# Patient Record
Sex: Female | Born: 2000
Health system: Southern US, Community
[De-identification: ages and names within clinical notes are randomized; demographics above are authoritative.]

## PROBLEM LIST (undated history)

## (undated) ENCOUNTER — Ambulatory Visit: Discharge status: Absent without leave | Payer: Medicaid Other

## (undated) ENCOUNTER — Ambulatory Visit (HOSPITAL_COMMUNITY): Admission: EM | Payer: 59

## (undated) DIAGNOSIS — F319 Bipolar disorder, unspecified: Secondary | ICD-10-CM

## (undated) DIAGNOSIS — I10 Essential (primary) hypertension: Secondary | ICD-10-CM

## (undated) DIAGNOSIS — I2699 Other pulmonary embolism without acute cor pulmonale: Secondary | ICD-10-CM

## (undated) DIAGNOSIS — J45909 Unspecified asthma, uncomplicated: Secondary | ICD-10-CM

## (undated) DIAGNOSIS — J353 Hypertrophy of tonsils with hypertrophy of adenoids: Secondary | ICD-10-CM

## (undated) HISTORY — DX: Essential (primary) hypertension: I10

## (undated) HISTORY — DX: Other pulmonary embolism without acute cor pulmonale: I26.99

## (undated) HISTORY — PX: WISDOM TOOTH EXTRACTION: SHX21

---
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
## 2019-01-01 ENCOUNTER — Other Ambulatory Visit: Payer: Self-pay

## 2019-01-01 ENCOUNTER — Other Ambulatory Visit (HOSPITAL_COMMUNITY)
Admission: RE | Admit: 2019-01-01 | Discharge: 2019-01-01 | Disposition: A | Discharge status: Home / Self Care | Payer: BC Managed Care – PPO | Source: Ambulatory Visit | Attending: Internal Medicine | Admitting: Internal Medicine

## 2019-01-01 DIAGNOSIS — Z01812 Encounter for preprocedural laboratory examination: Secondary | ICD-10-CM | POA: Insufficient documentation

## 2019-01-01 DIAGNOSIS — Z20828 Contact with and (suspected) exposure to other viral communicable diseases: Secondary | ICD-10-CM | POA: Diagnosis not present

## 2019-01-01 LAB — SARS CORONAVIRUS 2 (TAT 6-24 HRS): SARS Coronavirus 2: NEGATIVE

## 2019-01-02 ENCOUNTER — Inpatient Hospital Stay (HOSPITAL_COMMUNITY): Admission: RE | Admit: 2019-01-02 | Payer: BC Managed Care – PPO | Source: Ambulatory Visit

## 2019-01-02 ENCOUNTER — Encounter (HOSPITAL_COMMUNITY): Payer: BLUE CROSS/BLUE SHIELD

## 2019-01-15 ENCOUNTER — Other Ambulatory Visit: Payer: Self-pay

## 2019-01-15 ENCOUNTER — Other Ambulatory Visit (HOSPITAL_COMMUNITY)
Admission: RE | Admit: 2019-01-15 | Discharge: 2019-01-15 | Disposition: A | Discharge status: Home / Self Care | Payer: Medicaid Other | Source: Ambulatory Visit | Attending: Internal Medicine | Admitting: Internal Medicine

## 2019-01-15 DIAGNOSIS — U071 COVID-19: Secondary | ICD-10-CM | POA: Insufficient documentation

## 2019-01-15 DIAGNOSIS — Z01812 Encounter for preprocedural laboratory examination: Secondary | ICD-10-CM | POA: Diagnosis present

## 2019-01-16 ENCOUNTER — Telehealth: Payer: Self-pay

## 2019-01-16 LAB — SARS CORONAVIRUS 2 (TAT 6-24 HRS): SARS Coronavirus 2: POSITIVE — AB

## 2019-01-16 NOTE — Telephone Encounter (Signed)
Tried to contact patient no answer and vm is full 

## 2019-01-16 NOTE — Telephone Encounter (Signed)
Pt just learned about her positive covid test results via mychart. She called to see what she needed to tell her employer (She just got off work) and her family she has been around and how long she needs to be out of work for. I informed her that a nurse would call her back shortly with more information. She voiced understanding.   [place]

## 2019-01-16 NOTE — Telephone Encounter (Signed)
Phone call returned to pt.  She stated she needs a note to prove to her employer that the reason she went for COVID, was a pre-procedure requirement. Stated her employer feels she deliberately came to work, and exposed other people, when she should have made them aware, she had been tested.  Advised pt. she will need to contact her PCP for this note.   Advised pt. with positive COVID results/ virus was "detected", she can spread the germ to other people.  Reported she did not have any symptoms that made her question that she had COVID.  Denied cough, fever, shortness of breath.  Reported she has chronic sinus problems, and has had loss of taste and smell in past 4-5 days.  Stated the loss of taste/ smell has occurred off and on throughout the years, so she did not think anything about it.  Advised of CDC guidelines for self isolation/ ending isolation. Advised of safe practice guidelines.  Advised when to seek emergency care.  Instructed to rest and hydrate well.  Advised to leave the house during recommended isolation period, only if it is necessary to seek medical care.  Advised that anyone that was in direct contact with her, in past week, should quarantine x 14 days.  Advised the Health Dept. will be notified.  Pt. Verb. understanding of instructions.

## 2019-01-17 ENCOUNTER — Encounter (HOSPITAL_COMMUNITY): Payer: BC Managed Care – PPO

## 2019-01-17 DIAGNOSIS — U071 COVID-19: Secondary | ICD-10-CM | POA: Diagnosis not present

## 2019-01-22 ENCOUNTER — Encounter (HOSPITAL_COMMUNITY): Payer: Self-pay | Admitting: Emergency Medicine

## 2019-01-22 ENCOUNTER — Other Ambulatory Visit: Payer: Self-pay

## 2019-01-22 DIAGNOSIS — R0602 Shortness of breath: Secondary | ICD-10-CM | POA: Insufficient documentation

## 2019-01-22 DIAGNOSIS — Z79899 Other long term (current) drug therapy: Secondary | ICD-10-CM | POA: Diagnosis not present

## 2019-01-22 DIAGNOSIS — Z87891 Personal history of nicotine dependence: Secondary | ICD-10-CM | POA: Insufficient documentation

## 2019-01-22 DIAGNOSIS — U071 COVID-19: Secondary | ICD-10-CM | POA: Diagnosis not present

## 2019-01-22 DIAGNOSIS — Z91040 Latex allergy status: Secondary | ICD-10-CM | POA: Insufficient documentation

## 2019-01-22 NOTE — ED Triage Notes (Signed)
Pt c/o increased sob 2-3 days. Pt tested positive for covid x 7 days.

## 2019-01-23 ENCOUNTER — Emergency Department (HOSPITAL_COMMUNITY): Payer: Medicaid Other

## 2019-01-23 ENCOUNTER — Emergency Department (HOSPITAL_COMMUNITY)
Admission: EM | Admit: 2019-01-23 | Discharge: 2019-01-23 | Disposition: A | Discharge status: Home / Self Care | Payer: Medicaid Other | Attending: Emergency Medicine | Admitting: Emergency Medicine

## 2019-01-23 DIAGNOSIS — R0602 Shortness of breath: Secondary | ICD-10-CM

## 2019-01-23 DIAGNOSIS — U071 COVID-19: Secondary | ICD-10-CM

## 2019-01-23 LAB — URINALYSIS, ROUTINE W REFLEX MICROSCOPIC
Bilirubin Urine: NEGATIVE
Glucose, UA: NEGATIVE mg/dL
Hgb urine dipstick: NEGATIVE
Ketones, ur: NEGATIVE mg/dL
Nitrite: NEGATIVE
Protein, ur: NEGATIVE mg/dL
Specific Gravity, Urine: 1.025 (ref 1.005–1.030)
pH: 6 (ref 5.0–8.0)

## 2019-01-23 LAB — COMPREHENSIVE METABOLIC PANEL
ALT: 23 U/L (ref 0–44)
AST: 16 U/L (ref 15–41)
Albumin: 3.8 g/dL (ref 3.5–5.0)
Alkaline Phosphatase: 52 U/L (ref 38–126)
Anion gap: 6 (ref 5–15)
BUN: 13 mg/dL (ref 6–20)
CO2: 25 mmol/L (ref 22–32)
Calcium: 8.9 mg/dL (ref 8.9–10.3)
Chloride: 109 mmol/L (ref 98–111)
Creatinine, Ser: 0.58 mg/dL (ref 0.44–1.00)
GFR calc Af Amer: >60 mL/min (ref >60)
GFR calc non Af Amer: >60 mL/min (ref >60)
Glucose, Bld: 109 mg/dL — ABNORMAL HIGH (ref 70–99)
Potassium: 4.1 mmol/L (ref 3.5–5.1)
Sodium: 140 mmol/L (ref 135–145)
Total Bilirubin: 0.4 mg/dL (ref 0.3–1.2)
Total Protein: 7.4 g/dL (ref 6.5–8.1)

## 2019-01-23 LAB — CBC WITH DIFFERENTIAL/PLATELET
Abs Immature Granulocytes: 0.01 10*3/uL (ref 0.00–0.07)
Basophils Absolute: 0 10*3/uL (ref 0.0–0.1)
Basophils Relative: 0 %
Eosinophils Absolute: 0.1 10*3/uL (ref 0.0–0.5)
Eosinophils Relative: 2 %
HCT: 40.5 % (ref 36.0–46.0)
Hemoglobin: 12.8 g/dL (ref 12.0–15.0)
Immature Granulocytes: 0 %
Lymphocytes Relative: 44 %
Lymphs Abs: 2.5 10*3/uL (ref 0.7–4.0)
MCH: 28.6 pg (ref 26.0–34.0)
MCHC: 31.6 g/dL (ref 30.0–36.0)
MCV: 90.4 fL (ref 80.0–100.0)
Monocytes Absolute: 0.3 10*3/uL (ref 0.1–1.0)
Monocytes Relative: 6 %
Neutro Abs: 2.7 10*3/uL (ref 1.7–7.7)
Neutrophils Relative %: 48 %
Platelets: 225 10*3/uL (ref 150–400)
RBC: 4.48 MIL/uL (ref 3.87–5.11)
RDW: 13.8 % (ref 11.5–15.5)
WBC: 5.7 10*3/uL (ref 4.0–10.5)
nRBC: 0 % (ref 0.0–0.2)

## 2019-01-23 LAB — POC URINE PREG, ED: Preg Test, Ur: NEGATIVE

## 2019-01-23 MED ORDER — SODIUM CHLORIDE 0.9 % IV BOLUS
1000.0000 mL | Freq: Once | INTRAVENOUS | Status: AC
  Administered 2019-01-23: 1000 mL via INTRAVENOUS

## 2019-01-23 NOTE — ED Notes (Signed)
Pt O2 did not fall below 99% while ambulating, pt denies SOB, steady gait, stand by assist

## 2019-01-23 NOTE — ED Provider Notes (Signed)
[facility] EMERGENCY DEPARTMENT Provider Note   CSN: [id number] Arrival date & time: 01/22/19  2244   History Chief Complaint  Patient presents with   Shortness of Breath    LIBBI TOWNER is a 18 y.o. female.  The history is provided by the patient.  Shortness of Breath She has history of bipolar disorder and was diagnosed Covid +1-week ago and comes in because of shortness of breath for the last 2days.  Shortness of breath is both with exertion and at rest.  She denies cough and denies fever.  She has had anorexia and states that she thinks she is getting dehydrated.  She is having diarrhea 3-4 times a day.  She denies nausea or vomiting.  She denies arthralgias or myalgias.  She has been taking loperamide for diarrhea without any benefit.  She has had loss of smell and taste for about 3 weeks, but this is not unusual for her when she has sinus infections.  Past Medical History:  Diagnosis Date   Bipolar disorder (HCC)    Tonsillar and adenoid hypertrophy 07/2016   snores during sleep, denies apnea    Patient Active Problem List   Diagnosis Date Noted   Menorrhagia with irregular cycle 06/15/2017   Pregnancy examination or test, negative result 06/15/2017   Screening examination for STD (sexually transmitted disease) 06/15/2017   DMDD (disruptive mood dysregulation disorder) (HCC) 09/17/2016   MDD (major depressive disorder) 09/12/2016   Bipolar 1 disorder (HCC) 04/10/2016   Irritability and anger 04/10/2016   ODD (oppositional defiant disorder) 05/14/2014   Bipolar 1 disorder, depressed, moderate (HCC) 05/14/2014   Lives in group home 04/12/2014   Obesity 04/12/2014   Eczema 04/12/2014   Seasonal allergies 04/12/2014   Acanthosis nigricans 04/12/2014   Failed vision screen 04/12/2014    Past Surgical History:  Procedure Laterality Date   TONSILLECTOMY AND ADENOIDECTOMY N/A 08/02/2016   Procedure: TONSILLECTOMY AND ADENOIDECTOMY;  Surgeon: Newman Pies,  MD;  Location: [facility];  Service: ENT;  Laterality: N/A;   WISDOM TOOTH EXTRACTION       OB History    Gravida  0   Para  0   Term  0   Preterm  0   AB  0   Living  0     SAB  0   TAB  0   Ectopic  0   Multiple  0   Live Births  0           Family History  Problem Relation Age of Onset   Hypertension Mother    Cancer Mother        cervical   Diabetes Mother    Other Maternal Grandmother        pre diabetic   Other Paternal Grandmother        brain tumor    Social History   Tobacco Use   Smoking status: Former Smoker    Years: 1.00   Smokeless tobacco: Never Used  Substance Use Topics   Alcohol use: Yes    Comment: socially   Drug use: Yes    Types: Marijuana    Home Medications Prior to Admission medications   Medication Sig Start Date End Date Taking? Authorizing Provider  dicyclomine (BENTYL) 20 MG tablet Take 1 tablet (20 mg total) by mouth 3 (three) times daily as needed for spasms (abdominal cramping). 01/27/18   Long, Arlyss Repress, MD  ibuprofen (ADVIL) 800 MG tablet Take 1 tablet (800  mg total) by mouth every 6 (six) hours as needed for moderate pain. 06/23/18   Orpah Greek, MD  levocetirizine (XYZAL) 5 MG tablet Take 5 mg by mouth every evening.    [provider]  loperamide (IMODIUM) 2 MG capsule Take 1 capsule (2 mg total) by mouth 4 (four) times daily as needed for diarrhea or loose stools. 01/27/18   Long, Wonda Olds, MD  methocarbamol (ROBAXIN) 500 MG tablet Take 1 tablet (500 mg total) by mouth every 8 (eight) hours as needed for muscle spasms. 06/23/18   Orpah Greek, MD  montelukast (SINGULAIR) 10 MG tablet Take 10 mg by mouth at bedtime.    [provider]  Norethindrone-Ethinyl Estradiol-Fe Biphas (LO LOESTRIN FE) 1 MG-10 MCG / 10 MCG tablet Take 1 tablet by mouth daily. Take 1 daily by mouth 07/11/17   Derrek Monaco A, NP  ondansetron (ZOFRAN ODT) 4 MG disintegrating  tablet Take 1 tablet (4 mg total) by mouth every 8 (eight) hours as needed for nausea or vomiting. 01/27/18   Long, Wonda Olds, MD  ondansetron (ZOFRAN) 4 MG tablet Take 1 tablet (4 mg total) by mouth every 6 (six) hours as needed for nausea or vomiting. 06/23/18   Pollina, Gwenyth Allegra, MD    Allergies    Adhesive [tape] and Latex  Review of Systems   Review of Systems  Respiratory: Positive for shortness of breath.   All other systems reviewed and are negative.   Physical Exam Updated Vital Signs BP 117/73 (BP Location: Right Arm)    Pulse 84    Temp 98.8 F (37.1 C) (Oral)    Resp 20    Ht 5\' 8"  (1.727 m)    Wt 131.5 kg    LMP 01/10/2019    SpO2 100%    BMI 44.09 kg/m   Physical Exam Vitals and nursing note reviewed.   18 year old female, resting comfortably and in no acute distress. Vital signs are normal. Oxygen saturation is 100%, which is normal. Head is normocephalic and atraumatic. PERRLA, EOMI. Oropharynx is clear. Neck is nontender and supple without adenopathy or JVD. Back is nontender and there is no CVA tenderness. Lungs are clear without rales, wheezes, or rhonchi. Chest is nontender. Heart has regular rate and rhythm without murmur. Abdomen is soft, flat, nontender without masses or hepatosplenomegaly and peristalsis is normoactive. Extremities have no cyanosis or edema, full range of motion is present. Skin is warm and dry without rash. Neurologic: Mental status is normal, cranial nerves are intact, there are no motor or sensory deficits.  ED Results / Procedures / Treatments   Labs (all labs ordered are listed, but only abnormal results are displayed) Labs Reviewed  COMPREHENSIVE METABOLIC PANEL - Abnormal; Notable for the following components:      Result Value   Glucose, Bld 109 (*)    All other components within normal limits  URINALYSIS, ROUTINE W REFLEX MICROSCOPIC - Abnormal; Notable for the following components:   APPearance HAZY (*)    Leukocytes,Ua  TRACE (*)    Bacteria, UA RARE (*)    All other components within normal limits  CBC WITH DIFFERENTIAL/PLATELET  POC URINE PREG, ED    Radiology DG Chest Port 1 View  Result Date: 01/23/2019 CLINICAL DATA:  Shortness of breath EXAM: PORTABLE CHEST 1 VIEW COMPARISON:  None. FINDINGS: The heart size and mediastinal contours are within normal limits. Both lungs are clear. The visualized skeletal structures are unremarkable. IMPRESSION: No active  disease. Electronically Signed   By: Jasmine PangKim  Fujinaga M.D.   On: 01/23/2019 01:02    Procedures Procedures   Medications Ordered in ED Medications  sodium chloride 0.9 % bolus 1,000 mL (1,000 mLs Intravenous New Bag/Given 01/23/19 0107)    ED Course  I have reviewed the triage vital signs and the nursing notes.  Pertinent labs & imaging results that were available during my care of the patient were reviewed by me and considered in my medical decision making (see chart for details).  MDM Rules/Calculators/A&P COVID-19 infection with diarrhea and subjective dyspnea.  Patient has excellent oxygen saturation on room air.  She does not appear clinically dehydrated, but will check urinalysis and CBC and metabolic panel.  Will check chest x-ray.  Old records are reviewed confirming positive COVID-19 test on December 21.  There was no oxygen desaturation with ambulation.  Chest x-ray is unremarkable.  Labs are unremarkable although urine specific gravity is on the high side.  Following IV fluids, patient did state that she was feeling better.  She was encouraged to force fluids at home.  Return precautions discussed.  Final Clinical Impression(s) / ED Diagnoses Final diagnoses:  COVID-19 virus infection  Shortness of breath    Rx / DC Orders ED Discharge Orders    None       Dione BoozeGlick, Verniece Encarnacion, MD 01/23/19 [phone]

## 2019-01-23 NOTE — Discharge Instructions (Addendum)
Try to keep yourself hydrated.  Drink small amounts of liquids frequently throughout the day.  Return if your symptoms are worsening.

## 2019-01-24 ENCOUNTER — Ambulatory Visit: Payer: Medicaid Other | Attending: Internal Medicine

## 2019-01-24 ENCOUNTER — Other Ambulatory Visit: Payer: Self-pay

## 2019-01-24 DIAGNOSIS — Z20822 Contact with and (suspected) exposure to covid-19: Secondary | ICD-10-CM

## 2019-01-26 LAB — NOVEL CORONAVIRUS, NAA: SARS-CoV-2, NAA: NOT DETECTED

## 2019-03-21 ENCOUNTER — Other Ambulatory Visit: Payer: Medicaid Other

## 2019-03-30 ENCOUNTER — Other Ambulatory Visit: Payer: Self-pay

## 2019-03-30 ENCOUNTER — Ambulatory Visit: Payer: Medicaid Other | Attending: Internal Medicine

## 2019-03-30 DIAGNOSIS — Z20822 Contact with and (suspected) exposure to covid-19: Secondary | ICD-10-CM

## 2019-03-31 LAB — NOVEL CORONAVIRUS, NAA: SARS-CoV-2, NAA: NOT DETECTED

## 2019-05-17 ENCOUNTER — Other Ambulatory Visit: Payer: Medicaid Other

## 2019-05-25 ENCOUNTER — Ambulatory Visit: Payer: Medicaid Other | Admitting: Adult Health

## 2019-06-01 ENCOUNTER — Encounter: Payer: Self-pay | Admitting: Adult Health

## 2019-06-01 ENCOUNTER — Ambulatory Visit (INDEPENDENT_AMBULATORY_CARE_PROVIDER_SITE_OTHER): Payer: Medicaid Other | Admitting: Adult Health

## 2019-06-01 ENCOUNTER — Other Ambulatory Visit: Payer: Self-pay

## 2019-06-01 VITALS — BP 118/84 | HR 92 | Ht 68.0 in | Wt 287.8 lb

## 2019-06-01 DIAGNOSIS — Z113 Encounter for screening for infections with a predominantly sexual mode of transmission: Secondary | ICD-10-CM

## 2019-06-01 NOTE — Progress Notes (Signed)
  Subjective:     Patient ID: Katie Price, female   DOB: Jul 06, 2000, 19 y.o.   MRN: [id number]  HPI Jadine is a 19 year old black female, single, G0P0, in requesting STD testing, no problems, she said she and another girl used same sex toys. PCP is Dr Margo Aye.   Review of Systems  For STD testing, shared sex toys with another girl  Patient denies any headaches, hearing loss, fatigue, blurred vision, shortness of breath, chest pain, abdominal pain, problems with bowel movements, urination, or intercourse. No joint pain or mood swings.  Reviewed past medical,surgical, social and family history. Reviewed medications and allergies.     Objective:   Physical Exam BP 118/84 (BP Location: Left Arm, Patient Position: Sitting, Cuff Size: Large)   Pulse 92   Ht 5\' 8"  (1.727 m)   Wt 287 lb 12.8 oz (130.5 kg)   LMP 05/18/2019 (Exact Date)   BMI 43.76 kg/m fall risk is low Skin warm and dry.Pelvic: external genitalia is normal in appearance no lesions, vagina: white discharge without odor,urethra has no lesions or masses noted, cervix:smooth, uterus: normal size, shape and contour, non tender, no masses felt, adnexa: no masses or tenderness noted. Bladder is non tender and no masses felt. Nuswab obtained   .Examination chaperoned by 05/20/2019 LPN.  Ahassessment:     1. Screening examination for STD (sexually transmitted disease) Check HIV and RPR nuswab sent     Plan:     Follow up prn

## 2019-06-02 LAB — RPR: RPR Ser Ql: NONREACTIVE

## 2019-06-02 LAB — HIV ANTIBODY (ROUTINE TESTING W REFLEX): HIV Screen 4th Generation wRfx: NONREACTIVE

## 2019-06-04 LAB — NUSWAB VAGINITIS PLUS (VG+)
Atopobium vaginae: HIGH Score — AB
BVAB 2: HIGH Score — AB
Candida albicans, NAA: NEGATIVE
Candida glabrata, NAA: NEGATIVE
Chlamydia trachomatis, NAA: NEGATIVE
Megasphaera 1: HIGH Score — AB
Neisseria gonorrhoeae, NAA: NEGATIVE
Trich vag by NAA: NEGATIVE

## 2019-06-05 ENCOUNTER — Telehealth: Payer: Self-pay | Admitting: Adult Health

## 2019-06-05 MED ORDER — METRONIDAZOLE 500 MG PO TABS: 500.0000 mg | ORAL_TABLET | Freq: Two times a day (BID) | ORAL | 0 refills | Status: DC

## 2019-06-05 NOTE — Telephone Encounter (Signed)
Left message that nuswab +BV, will rx flagyl, all other tests negative

## 2019-08-20 DIAGNOSIS — L600 Ingrowing nail: Secondary | ICD-10-CM | POA: Diagnosis not present

## 2019-08-20 DIAGNOSIS — M79675 Pain in left toe(s): Secondary | ICD-10-CM | POA: Diagnosis not present

## 2019-09-03 DIAGNOSIS — L600 Ingrowing nail: Secondary | ICD-10-CM | POA: Diagnosis not present

## 2019-09-03 DIAGNOSIS — M79675 Pain in left toe(s): Secondary | ICD-10-CM | POA: Diagnosis not present

## 2019-09-04 DIAGNOSIS — J4540 Moderate persistent asthma, uncomplicated: Secondary | ICD-10-CM | POA: Diagnosis not present

## 2019-09-04 DIAGNOSIS — J302 Other seasonal allergic rhinitis: Secondary | ICD-10-CM | POA: Diagnosis not present

## 2019-09-04 DIAGNOSIS — J329 Chronic sinusitis, unspecified: Secondary | ICD-10-CM | POA: Diagnosis not present

## 2019-09-15 ENCOUNTER — Other Ambulatory Visit: Payer: Self-pay

## 2019-09-15 ENCOUNTER — Ambulatory Visit
Admission: EM | Admit: 2019-09-15 | Discharge: 2019-09-15 | Disposition: A | Discharge status: Home / Self Care | Payer: Medicaid Other | Attending: Family Medicine | Admitting: Family Medicine

## 2019-09-15 ENCOUNTER — Ambulatory Visit: Payer: Self-pay

## 2019-09-15 DIAGNOSIS — J0100 Acute maxillary sinusitis, unspecified: Secondary | ICD-10-CM | POA: Diagnosis not present

## 2019-09-15 DIAGNOSIS — Z1152 Encounter for screening for COVID-19: Secondary | ICD-10-CM

## 2019-09-15 MED ORDER — FLUTICASONE PROPIONATE 50 MCG/ACT NA SUSP: 1.0000 | Freq: Every day | NASAL | 2 refills | Status: DC

## 2019-09-15 MED ORDER — DEXAMETHASONE SODIUM PHOSPHATE 10 MG/ML IJ SOLN
10.0000 mg | Freq: Once | INTRAMUSCULAR | Status: AC
  Administered 2019-09-15: 10 mg via INTRAMUSCULAR

## 2019-09-15 MED ORDER — AMOXICILLIN-POT CLAVULANATE 875-125 MG PO TABS: 1.0000 | ORAL_TABLET | Freq: Two times a day (BID) | ORAL | 0 refills | Status: DC

## 2019-09-15 NOTE — ED Triage Notes (Signed)
Pt presents with c/o nasal congestion for past 2 weeks

## 2019-09-15 NOTE — Discharge Instructions (Addendum)
Steroid injection given here in clinic today.  Start antibiotics today, Flonase nasal spray.  Continue the Allegra Follow up as needed for continued or worsening symptoms

## 2019-09-16 LAB — SARS-COV-2, NAA 2 DAY TAT

## 2019-09-16 LAB — NOVEL CORONAVIRUS, NAA: SARS-CoV-2, NAA: NOT DETECTED

## 2019-09-17 NOTE — ED Provider Notes (Signed)
[facility]    CSN: [id number] Arrival date & time: 09/15/19  1203      History   Chief Complaint Chief Complaint  Patient presents with  . Nasal Congestion    HPI Katie Price is a 19 y.o. female.   Patient is a 19 year old female presents today with nasal congestion worsening over the past weeks.  Symptoms have been constant.  History of sinus infections in the past.  Has been using allergy medicine without much relief.  No fever, chills, body aches, night sweats.     Past Medical History:  Diagnosis Date  . Bipolar disorder (HCC)   . Tonsillar and adenoid hypertrophy 07/2016   snores during sleep, denies apnea    Patient Active Problem List   Diagnosis Date Noted  . Menorrhagia with irregular cycle 06/15/2017  . Pregnancy examination or test, negative result 06/15/2017  . Screening examination for STD (sexually transmitted disease) 06/15/2017  . DMDD (disruptive mood dysregulation disorder) (HCC) 09/17/2016  . MDD (major depressive disorder) 09/12/2016  . Bipolar 1 disorder (HCC) 04/10/2016  . Irritability and anger 04/10/2016  . ODD (oppositional defiant disorder) 05/14/2014  . Bipolar 1 disorder, depressed, moderate (HCC) 05/14/2014  . Lives in group home 04/12/2014  . Obesity 04/12/2014  . Eczema 04/12/2014  . Seasonal allergies 04/12/2014  . Acanthosis nigricans 04/12/2014  . Failed vision screen 04/12/2014    Past Surgical History:  Procedure Laterality Date  . TONSILLECTOMY AND ADENOIDECTOMY N/A 08/02/2016   Procedure: TONSILLECTOMY AND ADENOIDECTOMY;  Surgeon: Newman Pies, MD;  Location: [facility];  Service: ENT;  Laterality: N/A;  . WISDOM TOOTH EXTRACTION      OB History    Gravida  0   Para  0   Term  0   Preterm  0   AB  0   Living  0     SAB  0   TAB  0   Ectopic  0   Multiple  0   Live Births  0            Home Medications    Prior to Admission medications   Medication Sig Start Date End  Date Taking? Authorizing Provider  albuterol (PROVENTIL) (2.5 MG/3ML) 0.083% nebulizer solution SMARTSIG:1 Vial(s) Via Nebulizer Every 4-6 Hours PRN 01/17/19   [provider]  amoxicillin-clavulanate (AUGMENTIN) 875-125 MG tablet Take 1 tablet by mouth every 12 (twelve) hours. 09/15/19   Dahlia Byes A, NP  dicyclomine (BENTYL) 20 MG tablet Take 1 tablet (20 mg total) by mouth 3 (three) times daily as needed for spasms (abdominal cramping). 01/27/18   Long, Arlyss Repress, MD  fluticasone (FLONASE) 50 MCG/ACT nasal spray Place 1 spray into both nostrils daily. 09/15/19   Dahlia Byes A, NP  levocetirizine (XYZAL) 5 MG tablet Take 5 mg by mouth every evening.    [provider]  metroNIDAZOLE (FLAGYL) 500 MG tablet Take 1 tablet (500 mg total) by mouth 2 (two) times daily. 06/05/19   Adline Potter, NP  montelukast (SINGULAIR) 10 MG tablet Take 10 mg by mouth at bedtime.    [provider]  ondansetron (ZOFRAN ODT) 4 MG disintegrating tablet Take 1 tablet (4 mg total) by mouth every 8 (eight) hours as needed for nausea or vomiting. 01/27/18   Long, Arlyss Repress, MD  SYMBICORT 80-4.5 MCG/ACT inhaler Inhale 1 puff into the lungs daily. 02/26/19   [provider]  Burr Medico 150-35 MCG/24HR transdermal patch APPLY 1 PATCH  EVERY WEEKHFOR 3 WEEKS, THEN NO PATCH FOR 1 WEEK. 05/23/19   [provider]    Family History Family History  Problem Relation Age of Onset  . Hypertension Mother   . Cancer Mother        cervical  . Diabetes Mother   . Other Maternal Grandmother        pre diabetic  . Other Paternal Grandmother        brain tumor    Social History Social History   Tobacco Use  . Smoking status: Former Smoker    Years: 1.00  . Smokeless tobacco: Never Used  Vaping Use  . Vaping Use: Never used  Substance Use Topics  . Alcohol use: Yes    Comment: socially  . Drug use: Yes    Types: Marijuana    Comment: occassionally     Allergies   Adhesive [tape]  and Latex   Review of Systems Review of Systems   Physical Exam Triage Vital Signs ED Triage Vitals  Enc Vitals Group     BP 09/15/19 1245 122/84     Pulse Rate 09/15/19 1245 85     Resp 09/15/19 1245 16     Temp 09/15/19 1245 98.9 F (37.2 C)     Temp Source 09/15/19 1245 Oral     SpO2 09/15/19 1245 97 %     Weight --      Height --      Head Circumference --      Peak Flow --      Pain Score 09/15/19 1255 0     Pain Loc --      Pain Edu? --      Excl. in GC? --    No data found.  Updated Vital Signs BP 122/84 (BP Location: Right Arm)   Pulse 85   Temp 98.9 F (37.2 C) (Oral)   Resp 16   LMP 08/27/2019 (Approximate)   SpO2 97%   Visual Acuity Right Eye Distance:   Left Eye Distance:   Bilateral Distance:    Right Eye Near:   Left Eye Near:    Bilateral Near:     Physical Exam Vitals and nursing note reviewed.  Constitutional:      General: She is not in acute distress.    Appearance: Normal appearance. She is not ill-appearing, toxic-appearing or diaphoretic.  HENT:     Head: Normocephalic.     Right Ear: Tympanic membrane and ear canal normal.     Left Ear: Ear canal normal.     Nose: Congestion present.     Comments: Maxillary and frontal sinus tenderness  Eyes:     Conjunctiva/sclera: Conjunctivae normal.  Pulmonary:     Effort: Pulmonary effort is normal.  Musculoskeletal:        General: Normal range of motion.     Cervical back: Normal range of motion.  Skin:    General: Skin is warm and dry.     Findings: No rash.  Neurological:     Mental Status: She is alert.  Psychiatric:        Mood and Affect: Mood normal.      UC Treatments / Results  Labs (all labs ordered are listed, but only abnormal results are displayed) Labs Reviewed  NOVEL CORONAVIRUS, NAA   Narrative:    Performed at:  [address] [place], [place]  [id number] Lab Director: Jolene Schimke MD, Phone:  [phone]  SARS-COV-2, NAA 2  DAY  TAT   Narrative:    Performed at:  [address], [place], [place]  [id number] Lab Director: Jolene Schimke MD, Phone:  [phone]    EKG   Radiology No results found.  Procedures Procedures (including critical care time)  Medications Ordered in UC Medications  dexamethasone (DECADRON) injection 10 mg (10 mg Intramuscular Given 09/15/19 1330)    Initial Impression / Assessment and Plan / UC Course  I have reviewed the triage vital signs and the nursing notes.  Pertinent labs & imaging results that were available during my care of the patient were reviewed by me and considered in my medical decision making (see chart for details).     Sinusitis Steroid injection given here today  Flonase nasal spray and continue Allegra.  Amoxicillin for infection Follow up as needed for continued or worsening symptoms  Final Clinical Impressions(s) / UC Diagnoses   Final diagnoses:  Acute non-recurrent maxillary sinusitis     Discharge Instructions     Steroid injection given here in clinic today.  Start antibiotics today, Flonase nasal spray.  Continue the Allegra Follow up as needed for continued or worsening symptoms     ED Prescriptions    Medication Sig Dispense Auth. Provider   amoxicillin-clavulanate (AUGMENTIN) 875-125 MG tablet Take 1 tablet by mouth every 12 (twelve) hours. 14 tablet Diany Formosa A, NP   fluticasone (FLONASE) 50 MCG/ACT nasal spray Place 1 spray into both nostrils daily. 16 g Dahlia Byes A, NP     PDMP not reviewed this encounter.   Janace Aris, NP 09/17/19 1246

## 2019-09-25 DIAGNOSIS — J310 Chronic rhinitis: Secondary | ICD-10-CM | POA: Diagnosis not present

## 2019-09-25 DIAGNOSIS — J330 Polyp of nasal cavity: Secondary | ICD-10-CM | POA: Diagnosis not present

## 2019-09-25 DIAGNOSIS — J343 Hypertrophy of nasal turbinates: Secondary | ICD-10-CM | POA: Diagnosis not present

## 2019-09-26 ENCOUNTER — Other Ambulatory Visit (HOSPITAL_COMMUNITY): Payer: Self-pay | Admitting: Otolaryngology

## 2019-09-26 ENCOUNTER — Other Ambulatory Visit: Payer: Self-pay | Admitting: Otolaryngology

## 2019-09-26 DIAGNOSIS — J320 Chronic maxillary sinusitis: Secondary | ICD-10-CM

## 2019-10-03 ENCOUNTER — Ambulatory Visit
Admission: EM | Admit: 2019-10-03 | Discharge: 2019-10-03 | Disposition: A | Discharge status: Home / Self Care | Payer: Medicaid Other | Attending: Emergency Medicine | Admitting: Emergency Medicine

## 2019-10-03 ENCOUNTER — Encounter: Payer: Self-pay | Admitting: Emergency Medicine

## 2019-10-03 DIAGNOSIS — N898 Other specified noninflammatory disorders of vagina: Secondary | ICD-10-CM | POA: Diagnosis not present

## 2019-10-03 DIAGNOSIS — J329 Chronic sinusitis, unspecified: Secondary | ICD-10-CM | POA: Insufficient documentation

## 2019-10-03 LAB — POCT URINALYSIS DIP (MANUAL ENTRY)
Bilirubin, UA: NEGATIVE
Blood, UA: NEGATIVE
Glucose, UA: NEGATIVE mg/dL
Ketones, POC UA: NEGATIVE mg/dL
Leukocytes, UA: NEGATIVE
Nitrite, UA: NEGATIVE
Protein Ur, POC: NEGATIVE mg/dL
Spec Grav, UA: 1.025 (ref 1.010–1.025)
Urobilinogen, UA: 1 E.U./dL
pH, UA: 6 (ref 5.0–8.0)

## 2019-10-03 LAB — POCT URINE PREGNANCY: Preg Test, Ur: NEGATIVE

## 2019-10-03 MED ORDER — FLUCONAZOLE 150 MG PO TABS: 150.0000 mg | ORAL_TABLET | Freq: Once | ORAL | 0 refills | Status: AC

## 2019-10-03 MED ORDER — CEFDINIR 300 MG PO CAPS: 300.0000 mg | ORAL_CAPSULE | Freq: Two times a day (BID) | ORAL | 0 refills | Status: AC

## 2019-10-03 NOTE — ED Provider Notes (Signed)
[facility]    CSN: [id number] Arrival date & time: 10/03/19  1028      History   Chief Complaint Chief Complaint  Patient presents with   Otalgia    HPI Katie Price is a 19 y.o. female presenting today for evaluation of ear pain and vaginal discharge.  Patient reports over the past week she has had pain in her left ear and a sensation of fullness.  Radiates slightly into left neck.  Also has associated sore throat which she describes as a raw sensation.  She was recently evaluated by ENT and was placed on prednisone pack x12 days.  Is currently taking this.  Has had prior course of Augmentin for sinusitis.  She also reports vaginal discharge over the past 4 to 5 days.  Described as white thick and has associated itching.  Has discomfort and burning sensation.  Denies significant urinary symptoms.  Started after intercourse, but was using protection.  Last menstrual cycle 09/10/2019.  Uses patch for birth control.     HPI  Past Medical History:  Diagnosis Date   Bipolar disorder (HCC)    Tonsillar and adenoid hypertrophy 07/2016   snores during sleep, denies apnea    Patient Active Problem List   Diagnosis Date Noted   Menorrhagia with irregular cycle 06/15/2017   Pregnancy examination or test, negative result 06/15/2017   Screening examination for STD (sexually transmitted disease) 06/15/2017   DMDD (disruptive mood dysregulation disorder) (HCC) 09/17/2016   MDD (major depressive disorder) 09/12/2016   Bipolar 1 disorder (HCC) 04/10/2016   Irritability and anger 04/10/2016   ODD (oppositional defiant disorder) 05/14/2014   Bipolar 1 disorder, depressed, moderate (HCC) 05/14/2014   Lives in group home 04/12/2014   Obesity 04/12/2014   Eczema 04/12/2014   Seasonal allergies 04/12/2014   Acanthosis nigricans 04/12/2014   Failed vision screen 04/12/2014    Past Surgical History:  Procedure Laterality Date   TONSILLECTOMY AND  ADENOIDECTOMY N/A 08/02/2016   Procedure: TONSILLECTOMY AND ADENOIDECTOMY;  Surgeon: Newman Pieseoh, Su, MD;  Location: [facility];  Service: ENT;  Laterality: N/A;   WISDOM TOOTH EXTRACTION      OB History    Gravida  0   Para  0   Term  0   Preterm  0   AB  0   Living  0     SAB  0   TAB  0   Ectopic  0   Multiple  0   Live Births  0            Home Medications    Prior to Admission medications   Medication Sig Start Date End Date Taking? Authorizing Provider  albuterol (PROVENTIL) (2.5 MG/3ML) 0.083% nebulizer solution SMARTSIG:1 Vial(s) Via Nebulizer Every 4-6 Hours PRN 01/17/19   [provider]  amoxicillin-clavulanate (AUGMENTIN) 875-125 MG tablet Take 1 tablet by mouth every 12 (twelve) hours. 09/15/19   Dahlia ByesBast, Traci A, NP  cefdinir (OMNICEF) 300 MG capsule Take 1 capsule (300 mg total) by mouth 2 (two) times daily for 7 days. 10/03/19 10/10/19  Keron Koffman C, PA-C  dicyclomine (BENTYL) 20 MG tablet Take 1 tablet (20 mg total) by mouth 3 (three) times daily as needed for spasms (abdominal cramping). 01/27/18   Long, Arlyss RepressJoshua G, MD  fluconazole (DIFLUCAN) 150 MG tablet Take 1 tablet (150 mg total) by mouth once for 1 dose. 10/03/19 10/03/19  Yeny Schmoll C, PA-C  fluticasone (FLONASE) 50 MCG/ACT nasal spray  Place 1 spray into both nostrils daily. 09/15/19   Dahlia Byes A, NP  levocetirizine (XYZAL) 5 MG tablet Take 5 mg by mouth every evening.    [provider]  metroNIDAZOLE (FLAGYL) 500 MG tablet Take 1 tablet (500 mg total) by mouth 2 (two) times daily. 06/05/19   Adline Potter, NP  montelukast (SINGULAIR) 10 MG tablet Take 10 mg by mouth at bedtime.    [provider]  ondansetron (ZOFRAN ODT) 4 MG disintegrating tablet Take 1 tablet (4 mg total) by mouth every 8 (eight) hours as needed for nausea or vomiting. 01/27/18   Long, Arlyss Repress, MD  SYMBICORT 80-4.5 MCG/ACT inhaler Inhale 1 puff into the lungs daily. 02/26/19   [provider]  Burr Medico 150-35 MCG/24HR transdermal patch APPLY 1 PATCH EVERY WEEKHFOR 3 WEEKS, THEN NO PATCH FOR 1 WEEK. 05/23/19   [provider]    Family History Family History  Problem Relation Age of Onset   Hypertension Mother    Cancer Mother        cervical   Diabetes Mother    Other Maternal Grandmother        pre diabetic   Other Paternal Grandmother        brain tumor    Social History Social History   Tobacco Use   Smoking status: Former Smoker    Years: 1.00   Smokeless tobacco: Never Used  Building services engineer Use: Never used  Substance Use Topics   Alcohol use: Yes    Comment: socially   Drug use: Yes    Types: Marijuana    Comment: occassionally     Allergies   Adhesive [tape] and Latex   Review of Systems Review of Systems  Constitutional: Negative for activity change, appetite change, chills, fatigue and fever.  HENT: Positive for ear pain and sore throat. Negative for congestion, rhinorrhea, sinus pressure and trouble swallowing.   Eyes: Negative for discharge and redness.  Respiratory: Negative for cough, chest tightness and shortness of breath.   Cardiovascular: Negative for chest pain.  Gastrointestinal: Negative for abdominal pain, diarrhea, nausea and vomiting.  Genitourinary: Positive for dysuria and vaginal discharge. Negative for difficulty urinating, frequency, genital sores, menstrual problem, pelvic pain and vaginal bleeding.  Musculoskeletal: Negative for myalgias.  Skin: Negative for rash.  Neurological: Negative for dizziness, light-headedness and headaches.     Physical Exam Triage Vital Signs ED Triage Vitals  Enc Vitals Group     BP      Pulse      Resp      Temp      Temp src      SpO2      Weight      Height      Head Circumference      Peak Flow      Pain Score      Pain Loc      Pain Edu?      Excl. in GC?    No data found.  Updated Vital Signs BP 125/87 (BP Location: Right Arm)     Pulse 86    Temp 98.4 F (36.9 C) (Oral)    Resp 19    Ht 5\' 8"  (1.727 m)    Wt 270 lb (122.5 kg)    LMP 09/10/2019    SpO2 98%    BMI 41.05 kg/m   Visual Acuity Right Eye Distance:   Left Eye Distance:   Bilateral Distance:  Right Eye Near:   Left Eye Near:    Bilateral Near:     Physical Exam Vitals and nursing note reviewed.  Constitutional:      Appearance: She is well-developed.     Comments: No acute distress  HENT:     Head: Normocephalic and atraumatic.     Ears:     Comments: Bilateral ears without tenderness to palpation of external auricle, tragus and mastoid, EAC's without erythema or swelling, TM's with good bony landmarks and cone of light. Non erythematous.     Nose: Nose normal.     Mouth/Throat:     Comments: Oral mucosa pink and moist, no tonsillar enlargement or exudate. Posterior pharynx patent and nonerythematous, no uvula deviation or swelling. Normal phonation. Eyes:     Conjunctiva/sclera: Conjunctivae normal.  Cardiovascular:     Rate and Rhythm: Normal rate.  Pulmonary:     Effort: Pulmonary effort is normal. No respiratory distress.     Comments: Breathing comfortably at rest, CTABL, no wheezing, rales or other adventitious sounds auscultated Abdominal:     General: There is no distension.  Musculoskeletal:        General: Normal range of motion.     Cervical back: Neck supple.  Skin:    General: Skin is warm and dry.  Neurological:     Mental Status: She is alert and oriented to person, place, and time.      UC Treatments / Results  Labs (all labs ordered are listed, but only abnormal results are displayed) Labs Reviewed  POCT URINE PREGNANCY  POCT URINALYSIS DIP (MANUAL ENTRY)  CERVICOVAGINAL ANCILLARY ONLY    EKG   Radiology No results found.  Procedures Procedures (including critical care time)  Medications Ordered in UC Medications - No data to display  Initial Impression / Assessment and Plan / UC Course  I have  reviewed the triage vital signs and the nursing notes.  Pertinent labs & imaging results that were available during my care of the patient were reviewed by me and considered in my medical decision making (see chart for details).     1.  Ear pain/sinusitis-continue Flonase, Allegra, prednisone pack, will try alternative antibiotic for sinusitis and initiate on Omnicef.  Follow-up with ENT if persistent.  2.  Vaginal discharge-empirically treating for yeast given recent antibiotic use and nature of discharge.  Diflucan prescribed.  Vaginal swab pending for STD screening as well as confirmation of yeast versus BV.  Will call with results and alter therapy as needed.  Discussed strict return precautions. Patient verbalized understanding and is agreeable with plan.  Final Clinical Impressions(s) / UC Diagnoses   Final diagnoses:  Chronic sinusitis, unspecified location  Vaginal discharge     Discharge Instructions     Begin Omnicef twice daily for 1 week Complete course of prednisone Continue Flonase nasal spray and Allegra Diflucan 1 tablet today, repeat after course of Omnicef Follow-up with ENT if persistent Vaginal swab pending for STD screening, we will call if we need to change medicine    ED Prescriptions    Medication Sig Dispense Auth. Provider   cefdinir (OMNICEF) 300 MG capsule Take 1 capsule (300 mg total) by mouth 2 (two) times daily for 7 days. 14 capsule Mykeisha Dysert C, PA-C   fluconazole (DIFLUCAN) 150 MG tablet Take 1 tablet (150 mg total) by mouth once for 1 dose. 2 tablet Analucia Hush, Alliance C, PA-C     PDMP not reviewed this encounter.   Darrold Bezek, [employer]  C, PA-C 10/03/19 1119

## 2019-10-03 NOTE — ED Triage Notes (Signed)
LT ear pain and fullness x 1week. Pt would also like to be screened for std's, having white thick vaginal discharge, burning with urination.

## 2019-10-03 NOTE — Discharge Instructions (Signed)
Begin Omnicef twice daily for 1 week Complete course of prednisone Continue Flonase nasal spray and Allegra Diflucan 1 tablet today, repeat after course of Omnicef Follow-up with ENT if persistent Vaginal swab pending for STD screening, we will call if we need to change medicine

## 2019-10-04 LAB — CERVICOVAGINAL ANCILLARY ONLY
Bacterial Vaginitis (gardnerella): POSITIVE — AB
Candida Glabrata: NEGATIVE
Candida Vaginitis: NEGATIVE
Chlamydia: NEGATIVE
Comment: NEGATIVE
Comment: NEGATIVE
Comment: NEGATIVE
Comment: NEGATIVE
Comment: NEGATIVE
Comment: NORMAL
Neisseria Gonorrhea: NEGATIVE
Trichomonas: NEGATIVE

## 2019-10-08 ENCOUNTER — Telehealth (HOSPITAL_COMMUNITY): Payer: Self-pay | Admitting: Emergency Medicine

## 2019-10-08 MED ORDER — METRONIDAZOLE 500 MG PO TABS: 500.0000 mg | ORAL_TABLET | Freq: Two times a day (BID) | ORAL | 0 refills | Status: DC

## 2019-10-19 DIAGNOSIS — Z23 Encounter for immunization: Secondary | ICD-10-CM | POA: Diagnosis not present

## 2019-10-25 ENCOUNTER — Ambulatory Visit (HOSPITAL_COMMUNITY)
Admission: RE | Admit: 2019-10-25 | Discharge: 2019-10-25 | Disposition: A | Discharge status: Home / Self Care | Payer: Medicaid Other | Source: Ambulatory Visit | Attending: Otolaryngology | Admitting: Otolaryngology

## 2019-10-25 ENCOUNTER — Other Ambulatory Visit: Payer: Self-pay

## 2019-10-25 DIAGNOSIS — J320 Chronic maxillary sinusitis: Secondary | ICD-10-CM | POA: Diagnosis not present

## 2019-11-01 ENCOUNTER — Other Ambulatory Visit: Payer: Self-pay

## 2019-11-01 ENCOUNTER — Ambulatory Visit (INDEPENDENT_AMBULATORY_CARE_PROVIDER_SITE_OTHER): Payer: Medicaid Other | Admitting: Adult Health

## 2019-11-01 ENCOUNTER — Encounter: Payer: Self-pay | Admitting: Adult Health

## 2019-11-01 VITALS — BP 113/76 | HR 85 | Ht 68.0 in | Wt 285.2 lb

## 2019-11-01 DIAGNOSIS — Z30015 Encounter for initial prescription of vaginal ring hormonal contraceptive: Secondary | ICD-10-CM

## 2019-11-01 DIAGNOSIS — N926 Irregular menstruation, unspecified: Secondary | ICD-10-CM

## 2019-11-01 HISTORY — DX: Encounter for initial prescription of vaginal ring hormonal contraceptive: Z30.015

## 2019-11-01 LAB — POCT URINE PREGNANCY: Preg Test, Ur: NEGATIVE

## 2019-11-01 MED ORDER — ETONOGESTREL-ETHINYL ESTRADIOL 0.12-0.015 MG/24HR VA RING: VAGINAL_RING | VAGINAL | 12 refills | Status: DC

## 2019-11-01 NOTE — Progress Notes (Signed)
  Subjective:     Patient ID: Katie Price, female   DOB: 2000-11-12, 19 y.o.   MRN: [id number]  HPI Kinesha is a 19 year old black female,single, G0P0 in complaining of irregular bleeding on the patch, she was late changing the patch. PCP is Dr Margo Aye.   Review of Systems Irregular bleeding with the patch, she was late changing the patch  Reviewed past medical,surgical, social and family history. Reviewed medications and allergies.     Objective:   Physical Exam BP 113/76 (BP Location: Right Arm, Patient Position: Sitting, Cuff Size: Normal)   Pulse 85   Ht 5\' 8"  (1.727 m)   Wt 285 lb 3.2 oz (129.4 kg)   LMP 10/13/2019   BMI 43.36 kg/m UPT is negative. Skin warm and dry. Lungs: clear to ausculation bilaterally. Cardiovascular: regular rate and rhythm. Fall risk is low PHQ 2 score is 0.   Upstream - 11/01/19 1445      Pregnancy Intention Screening   Does the patient want to become pregnant in the next year? No    Does the patient's partner want to become pregnant in the next year? No    Would the patient like to discuss contraceptive options today? No      Contraception Wrap Up   Current Method Contraceptive Patch    End Method Vaginal Ring    Contraception Counseling Provided Yes             Assessment:     1. Irregular bleeding UPT is negative Discussed that with her weight the patch not as effective, will stop the patch and try nuva ring,can start today Use condoms for back up  She had negative CG/CHL 10/03/19 in ER   2. Encounter for initial prescription of vaginal ring hormonal contraceptive Meds ordered this encounter  Medications  . etonogestrel-ethinyl estradiol (NUVARING) 0.12-0.015 MG/24HR vaginal ring    Sig: Insert vaginally and leave in place for 3 consecutive weeks, then remove for 1 week.    Dispense:  1 each    Refill:  12    Order Specific Question:   Supervising Provider    Answer:   02-20-1974 [[id number]]      Plan:     Follow up in 3 months  or sooner if needed

## 2019-11-09 DIAGNOSIS — Z23 Encounter for immunization: Secondary | ICD-10-CM | POA: Diagnosis not present

## 2019-11-26 DIAGNOSIS — Z23 Encounter for immunization: Secondary | ICD-10-CM | POA: Diagnosis not present

## 2019-12-24 DIAGNOSIS — L600 Ingrowing nail: Secondary | ICD-10-CM | POA: Diagnosis not present

## 2019-12-28 DIAGNOSIS — J338 Other polyp of sinus: Secondary | ICD-10-CM | POA: Diagnosis not present

## 2019-12-28 DIAGNOSIS — J322 Chronic ethmoidal sinusitis: Secondary | ICD-10-CM | POA: Diagnosis not present

## 2019-12-28 DIAGNOSIS — J320 Chronic maxillary sinusitis: Secondary | ICD-10-CM | POA: Diagnosis not present

## 2019-12-28 DIAGNOSIS — J343 Hypertrophy of nasal turbinates: Secondary | ICD-10-CM | POA: Diagnosis not present

## 2020-01-07 DIAGNOSIS — L600 Ingrowing nail: Secondary | ICD-10-CM | POA: Diagnosis not present

## 2020-02-19 ENCOUNTER — Other Ambulatory Visit: Payer: Self-pay | Admitting: Otolaryngology

## 2020-02-21 DIAGNOSIS — J019 Acute sinusitis, unspecified: Secondary | ICD-10-CM | POA: Diagnosis not present

## 2020-02-21 DIAGNOSIS — J45901 Unspecified asthma with (acute) exacerbation: Secondary | ICD-10-CM | POA: Diagnosis not present

## 2020-03-03 DIAGNOSIS — H60592 Other noninfective acute otitis externa, left ear: Secondary | ICD-10-CM | POA: Diagnosis not present

## 2020-03-12 ENCOUNTER — Other Ambulatory Visit: Payer: Self-pay

## 2020-03-12 ENCOUNTER — Encounter: Payer: Self-pay | Admitting: Emergency Medicine

## 2020-03-12 ENCOUNTER — Ambulatory Visit
Admission: EM | Admit: 2020-03-12 | Discharge: 2020-03-12 | Disposition: A | Discharge status: Home / Self Care | Payer: Medicaid Other | Attending: Emergency Medicine | Admitting: Emergency Medicine

## 2020-03-12 DIAGNOSIS — H6593 Unspecified nonsuppurative otitis media, bilateral: Secondary | ICD-10-CM

## 2020-03-12 MED ORDER — PREDNISONE 10 MG PO TABS: 20.0000 mg | ORAL_TABLET | Freq: Every day | ORAL | 0 refills | Status: DC

## 2020-03-12 MED ORDER — DEXAMETHASONE SODIUM PHOSPHATE 10 MG/ML IJ SOLN
10.0000 mg | Freq: Once | INTRAMUSCULAR | Status: AC
  Administered 2020-03-12: 10 mg via INTRAMUSCULAR

## 2020-03-12 NOTE — ED Triage Notes (Signed)
Hx of ear infection a week ago.  Ear continues to hurt.  Hx of reoccurant ear infections.

## 2020-03-12 NOTE — ED Provider Notes (Signed)
[facility]   [id number] 03/12/20 Arrival Time: 0841  CC:EAR PAIN  SUBJECTIVE: History from: patient.  Katie Price is a 20 y.o. female with history of allergies sinusitis, asthma and recurrent ear infection presented to the urgent care for complaint of ear pain for the past week.  Denies a precipitating event, such as swimming or wearing ear plugs.  Patient states the pain is constant and achy in character.  Patient has tried amoxicillin and Flonase without relief.  Symptoms are made worse with lying down.  Report similar symptoms in the past.    Denies fever, chills, fatigue, sinus pain, rhinorrhea, ear discharge, sore throat, SOB, wheezing, chest pain, nausea, changes in bowel or bladder habits.    ROS: As per HPI.  All other pertinent ROS negative.      Past Medical History:  Diagnosis Date  . Bipolar disorder (HCC)   . Tonsillar and adenoid hypertrophy 07/2016   snores during sleep, denies apnea   Past Surgical History:  Procedure Laterality Date  . TONSILLECTOMY AND ADENOIDECTOMY N/A 08/02/2016   Procedure: TONSILLECTOMY AND ADENOIDECTOMY;  Surgeon: Newman Pies, MD;  Location: [facility];  Service: ENT;  Laterality: N/A;  . WISDOM TOOTH EXTRACTION     Allergies  Allergen Reactions  . Adhesive [Tape] Rash  . Latex Itching   No current facility-administered medications on file prior to encounter.   Current Outpatient Medications on File Prior to Encounter  Medication Sig Dispense Refill  . albuterol (PROVENTIL) (2.5 MG/3ML) 0.083% nebulizer solution SMARTSIG:1 Vial(s) Via Nebulizer Every 4-6 Hours PRN    . etonogestrel-ethinyl estradiol (NUVARING) 0.12-0.015 MG/24HR vaginal ring Insert vaginally and leave in place for 3 consecutive weeks, then remove for 1 week. 1 each 12  . fluticasone (FLONASE) 50 MCG/ACT nasal spray Place 1 spray into both nostrils daily. 16 g 2  . RESTASIS 0.05 % ophthalmic emulsion 1 drop 2 (two) times daily.    . SYMBICORT  80-4.5 MCG/ACT inhaler Inhale 1 puff into the lungs daily.     Social History   Socioeconomic History  . Marital status: Single    Spouse name: Not on file  . Number of children: Not on file  . Years of education: Not on file  . Highest education level: Not on file  Occupational History  . Not on file  Tobacco Use  . Smoking status: Former Smoker    Years: 1.00  . Smokeless tobacco: Never Used  Vaping Use  . Vaping Use: Never used  Substance and Sexual Activity  . Alcohol use: Yes    Comment: socially  . Drug use: Yes    Types: Marijuana    Comment: occassionally  . Sexual activity: Yes    Birth control/protection: None, Inserts  Other Topics Concern  . Not on file  Social History Narrative  . Not on file   Social Determinants of Health   Financial Resource Strain: Not on file  Food Insecurity: Not on file  Transportation Needs: Not on file  Physical Activity: Not on file  Stress: Not on file  Social Connections: Not on file  Intimate Partner Violence: Not on file   Family History  Problem Relation Age of Onset  . Hypertension Mother   . Cancer Mother        cervical  . Diabetes Mother   . Other Maternal Grandmother        pre diabetic  . Other Paternal Grandmother        brain  tumor    OBJECTIVE:  Vitals:   03/12/20 0913  BP: 126/75  Pulse: 95  Resp: 18  Temp: 98.4 F (36.9 C)  TempSrc: Oral  SpO2: 96%     General appearance: alert; appears fatigued HEENT: Ears: EACs clear, bilateral TM with middle ear effusion without erythema; Eyes: PERRL, EOMI grossly; Sinuses nontender to palpation; Nose: clear rhinorrhea; Throat: oropharynx mildly erythematous, tonsils 1+ without white tonsillar exudates, uvula midline Neck: supple without LAD Lungs: unlabored respirations, symmetrical air entry; cough: absent; no respiratory distress Heart: regular rate and rhythm.  Radial pulses 2+ symmetrical bilaterally Skin: warm and dry Psychological: alert and  cooperative; normal mood and affect  Imaging: No results found.   ASSESSMENT & PLAN:  1. Middle ear effusion, bilateral     Meds ordered this encounter  Medications  . dexamethasone (DECADRON) injection 10 mg  . predniSONE (DELTASONE) 10 MG tablet    Sig: Take 2 tablets (20 mg total) by mouth daily.    Dispense:  15 tablet    Refill:  0    Discharge instructions  Rest and drink plenty of fluids Prescribed medicine for middle ear effusion Continue Flonase as prescribed and directed Take medications as directed and to completion Continue to use OTC ibuprofen and/ or tylenol as needed for pain control Follow up with PCP if symptoms persists Return here or go to the ER if you have any new or worsening symptoms   Reviewed expectations re: course of current medical issues. Questions answered. Outlined signs and symptoms indicating need for more acute intervention. Patient verbalized understanding. After Visit Summary given.         Durward Parcel, FNP 03/12/20 [phone]

## 2020-03-12 NOTE — Discharge Instructions (Signed)
Rest and drink plenty of fluids Prescribed medicine for middle ear effusion Continue Flonase as prescribed and directed Take medications as directed and to completion Continue to use OTC ibuprofen and/ or tylenol as needed for pain control Follow up with PCP if symptoms persists Return here or go to the ER if you have any new or worsening symptom

## 2020-03-24 ENCOUNTER — Other Ambulatory Visit: Payer: Self-pay

## 2020-03-24 ENCOUNTER — Encounter (HOSPITAL_BASED_OUTPATIENT_CLINIC_OR_DEPARTMENT_OTHER): Payer: Self-pay | Admitting: Otolaryngology

## 2020-03-28 ENCOUNTER — Other Ambulatory Visit (HOSPITAL_COMMUNITY)
Admission: RE | Admit: 2020-03-28 | Discharge: 2020-03-28 | Disposition: A | Discharge status: Home / Self Care | Payer: Medicaid Other | Source: Ambulatory Visit | Attending: Otolaryngology | Admitting: Otolaryngology

## 2020-03-28 DIAGNOSIS — Z01812 Encounter for preprocedural laboratory examination: Secondary | ICD-10-CM | POA: Diagnosis present

## 2020-03-28 DIAGNOSIS — Z20822 Contact with and (suspected) exposure to covid-19: Secondary | ICD-10-CM | POA: Diagnosis not present

## 2020-03-28 LAB — SARS CORONAVIRUS 2 (TAT 6-24 HRS): SARS Coronavirus 2: NEGATIVE

## 2020-03-31 ENCOUNTER — Other Ambulatory Visit: Payer: Self-pay

## 2020-03-31 ENCOUNTER — Encounter (HOSPITAL_BASED_OUTPATIENT_CLINIC_OR_DEPARTMENT_OTHER): Payer: Self-pay | Admitting: Otolaryngology

## 2020-03-31 ENCOUNTER — Ambulatory Visit (HOSPITAL_BASED_OUTPATIENT_CLINIC_OR_DEPARTMENT_OTHER): Payer: Medicaid Other | Admitting: Anesthesiology

## 2020-03-31 ENCOUNTER — Ambulatory Visit (HOSPITAL_BASED_OUTPATIENT_CLINIC_OR_DEPARTMENT_OTHER)
Admission: RE | Admit: 2020-03-31 | Discharge: 2020-03-31 | Disposition: A | Discharge status: Home / Self Care | Payer: Medicaid Other | Attending: Otolaryngology | Admitting: Otolaryngology

## 2020-03-31 ENCOUNTER — Encounter (HOSPITAL_BASED_OUTPATIENT_CLINIC_OR_DEPARTMENT_OTHER)
Admission: RE | Disposition: A | Discharge status: Home / Self Care | Payer: Self-pay | Source: Home / Self Care | Attending: Otolaryngology

## 2020-03-31 DIAGNOSIS — J343 Hypertrophy of nasal turbinates: Secondary | ICD-10-CM | POA: Diagnosis not present

## 2020-03-31 DIAGNOSIS — J328 Other chronic sinusitis: Secondary | ICD-10-CM | POA: Insufficient documentation

## 2020-03-31 DIAGNOSIS — J322 Chronic ethmoidal sinusitis: Secondary | ICD-10-CM | POA: Diagnosis not present

## 2020-03-31 DIAGNOSIS — J320 Chronic maxillary sinusitis: Secondary | ICD-10-CM | POA: Diagnosis not present

## 2020-03-31 DIAGNOSIS — J339 Nasal polyp, unspecified: Secondary | ICD-10-CM | POA: Insufficient documentation

## 2020-03-31 DIAGNOSIS — J3489 Other specified disorders of nose and nasal sinuses: Secondary | ICD-10-CM | POA: Diagnosis not present

## 2020-03-31 DIAGNOSIS — J338 Other polyp of sinus: Secondary | ICD-10-CM | POA: Diagnosis not present

## 2020-03-31 DIAGNOSIS — J321 Chronic frontal sinusitis: Secondary | ICD-10-CM | POA: Diagnosis not present

## 2020-03-31 HISTORY — PX: TURBINATE REDUCTION: SHX6157

## 2020-03-31 HISTORY — PX: ETHMOIDECTOMY: SHX5197

## 2020-03-31 HISTORY — PX: MAXILLARY ANTROSTOMY: SHX2003

## 2020-03-31 HISTORY — PX: SINUS ENDO WITH FUSION: SHX5329

## 2020-03-31 HISTORY — DX: Unspecified asthma, uncomplicated: J45.909

## 2020-03-31 LAB — POCT PREGNANCY, URINE: Preg Test, Ur: NEGATIVE

## 2020-03-31 SURGERY — REDUCTION, NASAL TURBINATE
Anesthesia: General | Site: Nose | Laterality: Right

## 2020-03-31 MED ORDER — LIDOCAINE HCL (CARDIAC) PF 100 MG/5ML IV SOSY
PREFILLED_SYRINGE | INTRAVENOUS | Status: DC | PRN
  Administered 2020-03-31: 60 mg via INTRAVENOUS

## 2020-03-31 MED ORDER — SCOPOLAMINE 1 MG/3DAYS TD PT72
1.0000 | MEDICATED_PATCH | TRANSDERMAL | Status: DC
  Administered 2020-03-31: 1.5 mg via TRANSDERMAL

## 2020-03-31 MED ORDER — DEXMEDETOMIDINE (PRECEDEX) IN NS 20 MCG/5ML (4 MCG/ML) IV SYRINGE
PREFILLED_SYRINGE | INTRAVENOUS | Status: DC | PRN
  Administered 2020-03-31: 12 ug via INTRAVENOUS

## 2020-03-31 MED ORDER — ROCURONIUM BROMIDE 10 MG/ML (PF) SYRINGE
PREFILLED_SYRINGE | INTRAVENOUS | Status: AC
  Filled 2020-03-31: qty 10

## 2020-03-31 MED ORDER — MIDAZOLAM HCL 5 MG/5ML IJ SOLN
INTRAMUSCULAR | Status: DC | PRN
  Administered 2020-03-31: 2 mg via INTRAVENOUS

## 2020-03-31 MED ORDER — CEFAZOLIN SODIUM 1 G IJ SOLR
INTRAMUSCULAR | Status: AC
  Filled 2020-03-31: qty 30

## 2020-03-31 MED ORDER — OXYMETAZOLINE HCL 0.05 % NA SOLN
NASAL | Status: AC
  Filled 2020-03-31: qty 30

## 2020-03-31 MED ORDER — LACTATED RINGERS IV SOLN: INTRAVENOUS | Status: DC

## 2020-03-31 MED ORDER — FENTANYL CITRATE (PF) 100 MCG/2ML IJ SOLN
INTRAMUSCULAR | Status: AC
  Filled 2020-03-31: qty 2

## 2020-03-31 MED ORDER — AMOXICILLIN 875 MG PO TABS: 875.0000 mg | ORAL_TABLET | Freq: Two times a day (BID) | ORAL | 0 refills | Status: AC

## 2020-03-31 MED ORDER — DEXTROSE 5 % IV SOLN
INTRAVENOUS | Status: DC | PRN
  Administered 2020-03-31: 3 g via INTRAVENOUS

## 2020-03-31 MED ORDER — ONDANSETRON HCL 4 MG/2ML IJ SOLN
INTRAMUSCULAR | Status: AC
  Filled 2020-03-31: qty 2

## 2020-03-31 MED ORDER — PHENYLEPHRINE 40 MCG/ML (10ML) SYRINGE FOR IV PUSH (FOR BLOOD PRESSURE SUPPORT)
PREFILLED_SYRINGE | INTRAVENOUS | Status: DC | PRN
  Administered 2020-03-31: 80 ug via INTRAVENOUS
  Administered 2020-03-31: 120 ug via INTRAVENOUS

## 2020-03-31 MED ORDER — SUGAMMADEX SODIUM 500 MG/5ML IV SOLN
INTRAVENOUS | Status: AC
  Filled 2020-03-31: qty 5

## 2020-03-31 MED ORDER — PROPOFOL 10 MG/ML IV BOLUS
INTRAVENOUS | Status: DC | PRN
  Administered 2020-03-31: 200 mg via INTRAVENOUS
  Administered 2020-03-31: 30 mg via INTRAVENOUS

## 2020-03-31 MED ORDER — MIDAZOLAM HCL 2 MG/2ML IJ SOLN
INTRAMUSCULAR | Status: AC
  Filled 2020-03-31: qty 2

## 2020-03-31 MED ORDER — ONDANSETRON HCL 4 MG/2ML IJ SOLN
INTRAMUSCULAR | Status: DC | PRN
  Administered 2020-03-31: 4 mg via INTRAVENOUS

## 2020-03-31 MED ORDER — ACETAMINOPHEN 500 MG PO TABS
ORAL_TABLET | ORAL | Status: AC
  Filled 2020-03-31: qty 2

## 2020-03-31 MED ORDER — SCOPOLAMINE 1 MG/3DAYS TD PT72
MEDICATED_PATCH | TRANSDERMAL | Status: AC
  Filled 2020-03-31: qty 1

## 2020-03-31 MED ORDER — FENTANYL CITRATE (PF) 100 MCG/2ML IJ SOLN
INTRAMUSCULAR | Status: DC | PRN
  Administered 2020-03-31 (×2): 50 ug via INTRAVENOUS

## 2020-03-31 MED ORDER — MUPIROCIN 2 % EX OINT
TOPICAL_OINTMENT | CUTANEOUS | Status: DC | PRN
  Administered 2020-03-31: 1 via TOPICAL

## 2020-03-31 MED ORDER — DEXAMETHASONE SODIUM PHOSPHATE 4 MG/ML IJ SOLN
INTRAMUSCULAR | Status: DC | PRN
  Administered 2020-03-31: 10 mg via INTRAVENOUS

## 2020-03-31 MED ORDER — LIDOCAINE 2% (20 MG/ML) 5 ML SYRINGE
INTRAMUSCULAR | Status: AC
  Filled 2020-03-31: qty 5

## 2020-03-31 MED ORDER — PROPOFOL 10 MG/ML IV BOLUS
INTRAVENOUS | Status: AC
  Filled 2020-03-31: qty 40

## 2020-03-31 MED ORDER — ROCURONIUM BROMIDE 10 MG/ML (PF) SYRINGE
PREFILLED_SYRINGE | INTRAVENOUS | Status: DC | PRN
  Administered 2020-03-31: 20 mg via INTRAVENOUS
  Administered 2020-03-31: 80 mg via INTRAVENOUS

## 2020-03-31 MED ORDER — 0.9 % SODIUM CHLORIDE (POUR BTL) OPTIME
TOPICAL | Status: DC | PRN
  Administered 2020-03-31: 100 mL

## 2020-03-31 MED ORDER — ESMOLOL HCL 100 MG/10ML IV SOLN
INTRAVENOUS | Status: DC | PRN
  Administered 2020-03-31 (×2): 10 mg via INTRAVENOUS

## 2020-03-31 MED ORDER — SUGAMMADEX SODIUM 500 MG/5ML IV SOLN
INTRAVENOUS | Status: DC | PRN
  Administered 2020-03-31: 300 mg via INTRAVENOUS

## 2020-03-31 MED ORDER — ACETAMINOPHEN 500 MG PO TABS
1000.0000 mg | ORAL_TABLET | Freq: Once | ORAL | Status: AC
  Administered 2020-03-31: 1000 mg via ORAL

## 2020-03-31 MED ORDER — DEXAMETHASONE SODIUM PHOSPHATE 10 MG/ML IJ SOLN
INTRAMUSCULAR | Status: AC
  Filled 2020-03-31: qty 1

## 2020-03-31 MED ORDER — OXYCODONE-ACETAMINOPHEN 5-325 MG PO TABS: 1.0000 | ORAL_TABLET | ORAL | 0 refills | Status: AC | PRN

## 2020-03-31 MED ORDER — OXYMETAZOLINE HCL 0.05 % NA SOLN
NASAL | Status: DC | PRN
  Administered 2020-03-31: 1 via TOPICAL

## 2020-03-31 SURGICAL SUPPLY — 50 items
ATTRACTOMAT 16X20 MAGNETIC DRP (DRAPES) IMPLANT
BLADE RAD40 ROTATE 4M 4 5PK (BLADE) IMPLANT
BLADE RAD60 ROTATE M4 4 5PK (BLADE) IMPLANT
BLADE ROTATE RAD 12 4 M4 (BLADE) IMPLANT
BLADE ROTATE RAD 40 4 M4 (BLADE) IMPLANT
BLADE ROTATE TRICUT 4X13 M4 (BLADE) ×3 IMPLANT
BLADE TRICUT ROTATE M4 4 5PK (BLADE) IMPLANT
BUR HS RAD FRONTAL 3 (BURR) IMPLANT
CANISTER SUC SOCK COL 7IN (MISCELLANEOUS) ×3 IMPLANT
CANISTER SUCT 1200ML W/VALVE (MISCELLANEOUS) ×3 IMPLANT
COAGULATOR SUCT 8FR VV (MISCELLANEOUS) ×3 IMPLANT
COVER WAND RF STERILE (DRAPES) IMPLANT
DECANTER SPIKE VIAL GLASS SM (MISCELLANEOUS) IMPLANT
DRSG NASAL KENNEDY LMNT 8CM (GAUZE/BANDAGES/DRESSINGS) IMPLANT
DRSG NASOPORE 8CM (GAUZE/BANDAGES/DRESSINGS) ×3 IMPLANT
DRSG TELFA 3X8 NADH (GAUZE/BANDAGES/DRESSINGS) IMPLANT
ELECT REM PT RETURN 9FT ADLT (ELECTROSURGICAL) ×3
ELECTRODE REM PT RTRN 9FT ADLT (ELECTROSURGICAL) ×2 IMPLANT
GLOVE SURG ENC MOIS LTX SZ7.5 (GLOVE) IMPLANT
GLOVE SURG POLYISO LF SZ6.5 (GLOVE) ×3 IMPLANT
GLOVE SURG SS PI 7.5 STRL IVOR (GLOVE) ×3 IMPLANT
GOWN STRL REUS W/ TWL LRG LVL3 (GOWN DISPOSABLE) ×4 IMPLANT
GOWN STRL REUS W/TWL LRG LVL3 (GOWN DISPOSABLE) ×2
HEMOSTAT SURGICEL 2X14 (HEMOSTASIS) IMPLANT
IV NS 500ML (IV SOLUTION) ×1
IV NS 500ML BAXH (IV SOLUTION) ×2 IMPLANT
NEEDLE HYPO 25X1 1.5 SAFETY (NEEDLE) IMPLANT
NEEDLE SPNL 25GX3.5 QUINCKE BL (NEEDLE) IMPLANT
NS IRRIG 1000ML POUR BTL (IV SOLUTION) ×3 IMPLANT
PACK BASIN DAY SURGERY FS (CUSTOM PROCEDURE TRAY) ×3 IMPLANT
PACK ENT DAY SURGERY (CUSTOM PROCEDURE TRAY) ×3 IMPLANT
PATTIES SURGICAL .5 X3 (DISPOSABLE) IMPLANT
SLEEVE SCD COMPRESS KNEE MED (STOCKING) ×3 IMPLANT
SOLUTION BUTLER CLEAR DIP (MISCELLANEOUS) ×3 IMPLANT
SPLINT NASAL AIRWAY SILICONE (MISCELLANEOUS) IMPLANT
SPONGE GAUZE 2X2 8PLY STRL LF (GAUZE/BANDAGES/DRESSINGS) ×3 IMPLANT
SPONGE NEURO XRAY DETECT 1X3 (DISPOSABLE) ×3 IMPLANT
SUCTION FRAZIER HANDLE 10FR (MISCELLANEOUS)
SUCTION TUBE FRAZIER 10FR DISP (MISCELLANEOUS) IMPLANT
SUT CHROMIC 4 0 P 3 18 (SUTURE) IMPLANT
SUT PLAIN 4 0 ~~LOC~~ 1 (SUTURE) IMPLANT
SUT PROLENE 3 0 PS 2 (SUTURE) IMPLANT
SYR 50ML LL SCALE MARK (SYRINGE) ×3 IMPLANT
TOWEL GREEN STERILE FF (TOWEL DISPOSABLE) ×3 IMPLANT
TRACKER ENT INSTRUMENT (MISCELLANEOUS) ×3 IMPLANT
TRACKER ENT PATIENT (MISCELLANEOUS) ×3 IMPLANT
TUBE CONNECTING 20X1/4 (TUBING) ×3 IMPLANT
TUBE SALEM SUMP 16 FR W/ARV (TUBING) ×3 IMPLANT
TUBING STRAIGHTSHOT EPS 5PK (TUBING) ×3 IMPLANT
YANKAUER SUCT BULB TIP NO VENT (SUCTIONS) ×3 IMPLANT

## 2020-03-31 NOTE — H&P (Signed)
Cc: Chronic nasal obstruction, recurrent sinus infections  HPI:  The patient is a 20 year old female who returns today for her follow-up evaluation. The patient was last seen in 08/2019.  At that time, she was complaining of chronic nasal obstruction and recurrent sinus infections.  The patient was previously treated with multiple courses of antibiotics and topical and systemic steroids.  On exam, she was noted to have bilateral nasal polyposis and bilateral inferior turbinate hypertrophy. The patient was continued on her Flonase nasal spray and she was also given high dose Prednisone for 12 days.  She subsequently underwent a sinus CT scan.  The CT showed diffuse mucosal disease, with obliteration of her bilateral ostiomeatal complexes by mucosal thickening.  In addition, her right frontal ethmoid recess was also opacified. Her inferior turbinates were significantly hypertrophied.  The patient returns today complaining of no improvement in her symptoms.  She continues to have chronic nasal obstruction and bilateral facial pressure.  She has been on 3 to 6 courses of antibiotics a year for several years. She is interested in more definitive treatment of her condition.   Exam: The flexible scope was inserted into the right nasal cavity.  Endoscopy of the interior nasal cavity, superior, inferior, and middle meatus was performed. The sphenoid-ethmoid recess was examined. Edematous mucosa was noted.  Olfactory cleft was edematous.  Nasopharynx was clear.  Turbinates were hypertrophied but without mass. The procedure was repeated on the contralateral side with similar findings.  The patient tolerated the procedure well.  Instructions were given to avoid eating or drinking for 2 hours.  Assessment: 1.  Bilateral chronic rhinosinusitis, with mucosal diseases involving her bilateral maxillary sinuses and right frontal ethmoidal recess.   2.  Bilateral inferior turbinate hypertrophy, obstructing more than 95% of  her nasal passageways bilaterally.   3.  The patient has not responded to treatment with topical and systemic steroids as well as allergy medications.  She was also treated with multiple courses of antibiotics.   Plan: 1.  The nasal endoscopy findings and the CT images are reviewed with the patient.   2.  Based on the above findings, the patient will benefit from undergoing surgical intervention with bilateral turbinate reduction and endoscopic sinus surgery to treat her right frontal, right ethmoid and bilateral maxillary sinuses.  3.  The risks, benefits, alternatives and details of the procedures are reviewed with the  patient.  Questions are invited and answered.  4.  The patient would like to proceed with the procedures.

## 2020-03-31 NOTE — Anesthesia Procedure Notes (Signed)
Procedure Name: Intubation Date/Time: 03/31/2020 10:54 AM Performed by: Caren Macadam, CRNA Pre-anesthesia Checklist: Patient identified, Emergency Drugs available, Suction available and Patient being monitored Patient Re-evaluated:Patient Re-evaluated prior to induction Oxygen Delivery Method: Circle system utilized Preoxygenation: Pre-oxygenation with 100% oxygen Induction Type: IV induction Ventilation: Mask ventilation without difficulty Laryngoscope Size: Miller and 2 Grade View: Grade I Tube type: Oral Tube size: 7.0 mm Number of attempts: 1 Airway Equipment and Method: Stylet Placement Confirmation: ETT inserted through vocal cords under direct vision,  positive ETCO2 and breath sounds checked- equal and bilateral Secured at: 24 cm Tube secured with: Tape Dental Injury: Teeth and Oropharynx as per pre-operative assessment

## 2020-03-31 NOTE — Anesthesia Postprocedure Evaluation (Signed)
Anesthesia Post Note  Patient: MIALYNN SHELVIN  Procedure(s) Performed: TURBINATE REDUCTION (Bilateral Nose) ENDOSCOPIC MAXILLARY ANTROSTOMY WITH TISSUE REMOVAL (Bilateral Nose) RIGHT TOTAL ETHMOIDECTOMY AND FRONTAL RECESS exploration (Right Nose) SINUS ENDOSCOPY WITH FUSION NAVIGATION (Bilateral Nose)     Patient location during evaluation: PACU Anesthesia Type: General Level of consciousness: awake Pain management: pain level controlled Vital Signs Assessment: post-procedure vital signs reviewed and stable Respiratory status: spontaneous breathing and respiratory function stable Cardiovascular status: stable Postop Assessment: no apparent nausea or vomiting Anesthetic complications: no   No complications documented.  Last Vitals:  Vitals:   03/31/20 1228 03/31/20 1246  BP: 132/86 115/76  Pulse: 95 95  Resp: (!) 21 20  Temp:  37.2 C  SpO2: 100% 100%    Last Pain:  Vitals:   03/31/20 1246  TempSrc:   PainSc: 3                  Candra R Ranard Harte

## 2020-03-31 NOTE — Transfer of Care (Signed)
Immediate Anesthesia Transfer of Care Note  Patient: Katie Price  Procedure(s) Performed: Frederik Schmidt REDUCTION (Bilateral Nose) ENDOSCOPIC MAXILLARY ANTROSTOMY WITH TISSUE REMOVAL (Bilateral Nose) RIGHT TOTAL ETHMOIDECTOMY AND FRONTAL RECESS exploration (Right Nose) SINUS ENDOSCOPY WITH FUSION NAVIGATION (Bilateral Nose)  Patient Location: PACU  Anesthesia Type:General  Level of Consciousness: awake  Airway & Oxygen Therapy: Patient Spontanous Breathing and Patient connected to face mask oxygen  Post-op Assessment: Report given to RN and Post -op Vital signs reviewed and stable  Post vital signs: Reviewed and stable  Last Vitals:  Vitals Value Taken Time  BP 118/71 03/31/20 1212  Temp    Pulse 94 03/31/20 1214  Resp 25 03/31/20 1214  SpO2 100 % 03/31/20 1214  Vitals shown include unvalidated device data.  Last Pain:  Vitals:   03/31/20 0913  TempSrc: Oral  PainSc: 0-No pain      Patients Stated Pain Goal: 4 (03/31/20 0913)  Complications: No complications documented.

## 2020-03-31 NOTE — Discharge Instructions (Addendum)
POSTOPERATIVE INSTRUCTIONS FOR PATIENTS HAVING NASAL OR SINUS OPERATIONS ACTIVITY: Restrict activity at home for the first two days, resting as much as possible. Light activity is best. You may usually return to work within a week. You should refrain from nose blowing, strenuous activity, or heavy lifting greater than 20lbs for a total of one week after your operation.  If sneezing cannot be avoided, sneeze with your mouth open. DISCOMFORT: You may experience a dull headache and pressure along with nasal congestion and discharge. These symptoms may be worse during the first week after the operation but may last as long as two to four weeks.  Please take Tylenol or the pain medication that has been prescribed for you. Do not take aspirin or aspirin containing medications since they may cause bleeding.  You may experience symptoms of post nasal drainage, nasal congestion, headaches and fatigue for two or three months after your operation.  BLEEDING: You may have some blood tinged nasal drainage for approximately two weeks after the operation.  The discharge will be worse for the first week.  Please call our office at [phone] or go to the nearest hospital emergency room if you experience any of the following: heavy, bright red blood from your nose or mouth that lasts longer than 15 minutes or coughing up or vomiting bright red blood or blood clots. GENERAL CONSIDERATIONS: 1. A gauze dressing will be placed on your upper lip to absorb any drainage after the operation. You may need to change this several times a day.  If you do not have very much drainage, you may remove the dressing.  Remember that you may gently wipe your nose with a tissue and sniff in, but DO NOT blow your nose. 2. Please keep all of your postoperative appointments.  Your final results after the operation will depend on proper follow-up.  The initial visit is usually 2 to 5 days after the operation.  During this visit, the remaining nasal  packing and internal septal splints will be removed.  Your nasal and sinus cavities will be cleaned.  During the second visit, your nasal and sinus cavities will be cleaned again. Have someone drive you to your first two postoperative appointments.  3. How you care for your nose after the operation will influence the results that you obtain.  You should follow all directions, take your medication as prescribed, and call our office [phone] with any problems or questions. 4. You may be more comfortable sleeping with your head elevated on two pillows. 5. Do not take any medications that we have not prescribed or recommended. WARNING SIGNS: if any of the following should occur, please call our office: 1. Persistent fever greater than 102F. 2. Persistent vomiting. 3. Severe and constant pain that is not relieved by prescribed pain medication. 4. Trauma to the nose. 5. Rash or unusual side effects from any medicines.   May take Tylenol after 3pm, if needed.   Post Anesthesia Home Care Instructions  Activity: Get plenty of rest for the remainder of the day. A responsible individual must stay with you for 24 hours following the procedure.  For the next 24 hours, DO NOT: -Drive a car -Advertising copywriter -Drink alcoholic beverages -Take any medication unless instructed by your physician -Make any legal decisions or sign important papers.  Meals: Start with liquid foods such as gelatin or soup. Progress to regular foods as tolerated. Avoid greasy, spicy, heavy foods. If nausea and/or vomiting occur, drink only clear liquids until the  nausea and/or vomiting subsides. Call your physician if vomiting continues.  Special Instructions/Symptoms: Your throat may feel dry or sore from the anesthesia or the breathing tube placed in your throat during surgery. If this causes discomfort, gargle with warm salt water. The discomfort should disappear within 24 hours.  If you had a scopolamine patch placed  behind your ear for the management of post- operative nausea and/or vomiting:  1. The medication in the patch is effective for 72 hours, after which it should be removed.  Wrap patch in a tissue and discard in the trash. Wash hands thoroughly with soap and water. 2. You may remove the patch earlier than 72 hours if you experience unpleasant side effects which may include dry mouth, dizziness or visual disturbances. 3. Avoid touching the patch. Wash your hands with soap and water after contact with the patch.

## 2020-03-31 NOTE — Op Note (Signed)
DATE OF PROCEDURE: 03/31/2020  OPERATIVE REPORT   SURGEON: Newman Pies, MD   PREOPERATIVE DIAGNOSES:  1. Bilateral chronic maxillary sinusitis and polyposis. 2. Right chronic frontal and ethmoid sinusitis and polyposis. 3. Bilateral inferior turbinate hypertrophy.  4. Chronic nasal obstruction.  POSTOPERATIVE DIAGNOSES:  1. Bilateral chronic maxillary sinusitis and polyposis. 2. Right chronic frontal and ethmoid sinusitis and polyposis. 3. Bilateral inferior turbinate hypertrophy.  4. Chronic nasal obstruction.  PROCEDURE PERFORMED:  1. Right endoscopic frontal sinusotomy and total ethmoidectomy. 2. Bilateral partial inferior turbinate resection.  3. Bilateral maxillary antrostomy and polyp removal 4. FUSION stereotactic image guidance  ANESTHESIA: General endotracheal tube anesthesia.   COMPLICATIONS: None.   ESTIMATED BLOOD LOSS: 200 mL.   INDICATION FOR PROCEDURE: Katie Price is a 20 y.o. female with a history of chronic nasal obstruction and recurrent sinus infections.  The patient was previously treated with multiple courses of antibiotics and topical and systemic steroids.  On exam, she was noted to have bilateral nasal polyposis and bilateral inferior turbinate hypertrophy. The patient was continued on her Flonase nasal spray and she was also given high dose Prednisone for 12 days.  She subsequently underwent a sinus CT scan.  The CT showed diffuse mucosal disease, with obliteration of her bilateral ostiomeatal complexes by mucosal thickening.  In addition, her right frontal ethmoid recess was also opacified. Her inferior turbinates were significantly hypertrophied.   Based on the above findings, the decision was made for the patient to undergo the above-stated procedures. The risks, benefits, alternatives, and details of the procedures were discussed with the patient. Questions were invited and answered. Informed consent was obtained.   DESCRIPTION OF PROCEDURE: The patient was  taken to the operating room and placed supine on the operating table. General endotracheal tube anesthesia was administered by the anesthesiologist. The patient was positioned, and prepped and draped in the standard fashion for nasal surgery. Pledgets soaked with Afrin were placed in both nasal cavities for decongestion. The pledgets were subsequently removed. The FUSION stereotactic image guidance marker was placed. The image guidance system was functional throughout the case.  The inferior one half of both hypertrophied inferior turbinate was crossclamped with a Kelly clamp. The inferior one half of each inferior turbinate was then resected with a pair of cross cutting scissors. Hemostasis was achieved with a suction cautery device.   Using a 0 endoscope, the right nasal cavity was examined. A large middle turbinate was noted. Using Tru-Cut forceps, the inferior one third and medial one half of the turbinate was resected. Polypoid tissue was noted within the middle meatus. The polypoid tissue was removed using a combination of microdebrider and Blakesley forceps. The uncinate process was resected with a freer elevator. The maxillary antrum was entered and enlarged using a combination of backbiter and microdebrider. Polypoid tissue was removed from the right maxillary sinus. The same procedure was repeated on the left side.  Attention was then focused on the right ethmoid sinuses. The bony partitions of the anterior and posterior ethmoid cavities were taken down. Polypoid tissue was noted and removed. Attention was then focused on the right frontal sinus. The frontal recess was identified and enlarged by removing the surrounding bony partitions. Polypoid tissue was removed from the frontal recess. All  sinuses were copiously irrigated with saline solution.  The care of the patient was turned over to the anesthesiologist. The patient was awakened from anesthesia without difficulty. The patient was extubated  and transferred to the recovery room in good  condition.   OPERATIVE FINDINGS: Bilateral inferior turbinate hypertrophy. Bilateral chronic rhinosinusitis and polyposis.  SPECIMEN: Bilateral sinus contents.  FOLLOWUP CARE: The patient be discharged home once she is awake and alert. The patient will be placed on Percocet p.r.n. pain, and amoxicillin 875 mg p.o. b.i.d. for 5 days. The patient will follow up in my office in 1 week.  Su Philomena Doheny, MD

## 2020-03-31 NOTE — Anesthesia Preprocedure Evaluation (Addendum)
Anesthesia Evaluation  Patient identified by MRN, date of birth, ID band Patient awake    Reviewed: Allergy & Precautions, H&P , NPO status , Patient's Chart, lab work & pertinent test results  Airway Mallampati: II  TM Distance: >3 FB Neck ROM: Full    Dental no notable dental hx.    Pulmonary asthma , Sleep apnea: suggestive features, no formal diagnosis. , former smoker,    Pulmonary exam normal breath sounds clear to auscultation       Cardiovascular negative cardio ROS Normal cardiovascular exam Rhythm:Regular Rate:Normal     Neuro/Psych PSYCHIATRIC DISORDERS Depression Bipolar Disorder negative neurological ROS     GI/Hepatic negative GI ROS, (+)     substance abuse  marijuana use,   Endo/Other  Morbid obesity  Renal/GU negative Renal ROS  negative genitourinary   Musculoskeletal negative musculoskeletal ROS (+)   Abdominal (+) + obese,   Peds negative pediatric ROS (+)  Hematology negative hematology ROS (+)   Anesthesia Other Findings   Reproductive/Obstetrics negative OB ROS                            Anesthesia Physical Anesthesia Plan  ASA: II  Anesthesia Plan: General   Post-op Pain Management:    Induction: Intravenous  PONV Risk Score and Plan: 3 and Scopolamine patch - Pre-op, Dexamethasone, Ondansetron and Treatment may vary due to age or medical condition  Airway Management Planned: Oral ETT  Additional Equipment: None  Intra-op Plan:   Post-operative Plan: Extubation in OR  Informed Consent: I have reviewed the patients History and Physical, chart, labs and discussed the procedure including the risks, benefits and alternatives for the proposed anesthesia with the patient or authorized representative who has indicated his/her understanding and acceptance.     Dental advisory given  Plan Discussed with: CRNA, Anesthesiologist and Surgeon  Anesthesia  Plan Comments:        Anesthesia Quick Evaluation

## 2020-04-01 ENCOUNTER — Encounter (HOSPITAL_BASED_OUTPATIENT_CLINIC_OR_DEPARTMENT_OTHER): Payer: Self-pay | Admitting: Otolaryngology

## 2020-04-01 LAB — SURGICAL PATHOLOGY

## 2020-04-25 DIAGNOSIS — I2693 Single subsegmental pulmonary embolism without acute cor pulmonale: Secondary | ICD-10-CM | POA: Diagnosis not present

## 2020-04-25 DIAGNOSIS — R000 Tachycardia, unspecified: Secondary | ICD-10-CM | POA: Diagnosis not present

## 2020-04-25 DIAGNOSIS — E876 Hypokalemia: Secondary | ICD-10-CM | POA: Diagnosis not present

## 2020-04-25 DIAGNOSIS — B373 Candidiasis of vulva and vagina: Secondary | ICD-10-CM | POA: Diagnosis not present

## 2020-04-29 DIAGNOSIS — Z6841 Body Mass Index (BMI) 40.0 and over, adult: Secondary | ICD-10-CM | POA: Diagnosis not present

## 2020-04-29 DIAGNOSIS — R7301 Impaired fasting glucose: Secondary | ICD-10-CM | POA: Diagnosis not present

## 2020-04-29 DIAGNOSIS — E876 Hypokalemia: Secondary | ICD-10-CM | POA: Diagnosis not present

## 2020-04-29 DIAGNOSIS — E559 Vitamin D deficiency, unspecified: Secondary | ICD-10-CM | POA: Diagnosis not present

## 2020-04-30 ENCOUNTER — Ambulatory Visit: Payer: Medicaid Other | Admitting: Advanced Practice Midwife

## 2020-05-01 DIAGNOSIS — E559 Vitamin D deficiency, unspecified: Secondary | ICD-10-CM | POA: Diagnosis not present

## 2020-05-01 DIAGNOSIS — J339 Nasal polyp, unspecified: Secondary | ICD-10-CM | POA: Diagnosis not present

## 2020-05-01 DIAGNOSIS — R7303 Prediabetes: Secondary | ICD-10-CM | POA: Diagnosis not present

## 2020-05-01 DIAGNOSIS — R7301 Impaired fasting glucose: Secondary | ICD-10-CM | POA: Diagnosis not present

## 2020-05-01 DIAGNOSIS — J4540 Moderate persistent asthma, uncomplicated: Secondary | ICD-10-CM | POA: Diagnosis not present

## 2020-05-01 DIAGNOSIS — Z309 Encounter for contraceptive management, unspecified: Secondary | ICD-10-CM | POA: Diagnosis not present

## 2020-05-01 DIAGNOSIS — L500 Allergic urticaria: Secondary | ICD-10-CM | POA: Diagnosis not present

## 2020-05-01 DIAGNOSIS — R1084 Generalized abdominal pain: Secondary | ICD-10-CM | POA: Diagnosis not present

## 2020-05-07 ENCOUNTER — Ambulatory Visit: Payer: Medicaid Other | Admitting: Allergy & Immunology

## 2020-05-17 ENCOUNTER — Encounter: Payer: Self-pay | Admitting: Emergency Medicine

## 2020-05-17 ENCOUNTER — Other Ambulatory Visit: Payer: Self-pay

## 2020-05-17 ENCOUNTER — Ambulatory Visit
Admission: EM | Admit: 2020-05-17 | Discharge: 2020-05-17 | Disposition: A | Discharge status: Home / Self Care | Payer: 59 | Attending: Family Medicine | Admitting: Family Medicine

## 2020-05-17 DIAGNOSIS — R3915 Urgency of urination: Secondary | ICD-10-CM

## 2020-05-17 DIAGNOSIS — N760 Acute vaginitis: Secondary | ICD-10-CM | POA: Diagnosis not present

## 2020-05-17 DIAGNOSIS — R300 Dysuria: Secondary | ICD-10-CM

## 2020-05-17 DIAGNOSIS — R350 Frequency of micturition: Secondary | ICD-10-CM

## 2020-05-17 LAB — POCT URINALYSIS DIP (MANUAL ENTRY)
Bilirubin, UA: NEGATIVE
Blood, UA: NEGATIVE
Glucose, UA: NEGATIVE mg/dL
Ketones, POC UA: NEGATIVE mg/dL
Leukocytes, UA: NEGATIVE
Nitrite, UA: NEGATIVE
Protein Ur, POC: NEGATIVE mg/dL
Spec Grav, UA: 1.02 (ref 1.010–1.025)
Urobilinogen, UA: 0.2 E.U./dL
pH, UA: 5.5 (ref 5.0–8.0)

## 2020-05-17 LAB — POCT URINE PREGNANCY: Preg Test, Ur: NEGATIVE

## 2020-05-17 MED ORDER — FLUCONAZOLE 150 MG PO TABS: ORAL_TABLET | ORAL | 0 refills | Status: DC

## 2020-05-17 NOTE — ED Triage Notes (Signed)
Pt is having UTI symptoms but possibly BV.  She has reoccurring infections, BV and yeast infections.  Pt states she has taking flagyl about 1 month ago.

## 2020-05-17 NOTE — ED Provider Notes (Signed)
[facility]   [id number] 05/17/20 Arrival Time: 0812   CC: VAGINAL DISCHARGE  SUBJECTIVE:  Katie Price is a 20 y.o. female who presents with complaints of dysuria, urinary frequency, urgency. Has recurrent BV and yeast as well. Was last treated for BV about a month ago. Also reports thick, white vaginal discharge. Denies a precipitating event, recent sexual encounter or recent antibiotic use. Patient is sexually active. Has not tried OTC medications without relief. Worsening symptoms with urination. Denies fever, chills, nausea, vomiting, abdominal or pelvic pain, vaginal odor, vaginal bleeding, dyspareunia, vaginal rashes or lesions.   Patient's last menstrual period was 04/07/2020. Current birth control method: Compliant with BC:  ROS: As per HPI.  All other pertinent ROS negative.     Past Medical History:  Diagnosis Date  . Asthma   . Bipolar disorder (HCC)   . Tonsillar and adenoid hypertrophy 07/2016   snores during sleep, denies apnea   Past Surgical History:  Procedure Laterality Date  . ETHMOIDECTOMY Right 03/31/2020   Procedure: RIGHT TOTAL ETHMOIDECTOMY AND FRONTAL RECESS exploration;  Surgeon: Newman Pies, MD;  Location: [facility];  Service: ENT;  Laterality: Right;  . MAXILLARY ANTROSTOMY Bilateral 03/31/2020   Procedure: ENDOSCOPIC MAXILLARY ANTROSTOMY WITH TISSUE REMOVAL;  Surgeon: Newman Pies, MD;  Location: [facility];  Service: ENT;  Laterality: Bilateral;  . SINUS ENDO WITH FUSION Bilateral 03/31/2020   Procedure: SINUS ENDOSCOPY WITH FUSION NAVIGATION;  Surgeon: Newman Pies, MD;  Location: [facility];  Service: ENT;  Laterality: Bilateral;  . TONSILLECTOMY AND ADENOIDECTOMY N/A 08/02/2016   Procedure: TONSILLECTOMY AND ADENOIDECTOMY;  Surgeon: Newman Pies, MD;  Location: [facility];  Service: ENT;  Laterality: N/A;  . TURBINATE REDUCTION Bilateral 03/31/2020   Procedure: TURBINATE REDUCTION;  Surgeon: Newman Pies, MD;  Location:  SUR[facility]ERY CENTER;  Service: ENT;  Laterality: Bilateral;  . WISDOM TOOTH EXTRACTION     Allergies  Allergen Reactions  . Adhesive [Tape] Rash  . Latex Itching   No current facility-administered medications on file prior to encounter.   Current Outpatient Medications on File Prior to Encounter  Medication Sig Dispense Refill  . albuterol (PROVENTIL) (2.5 MG/3ML) 0.083% nebulizer solution SMARTSIG:1 Vial(s) Via Nebulizer Every 4-6 Hours PRN    . etonogestrel-ethinyl estradiol (NUVARING) 0.12-0.015 MG/24HR vaginal ring Insert vaginally and leave in place for 3 consecutive weeks, then remove for 1 week. 1 each 12  . fexofenadine (ALLEGRA) 180 MG tablet Take 180 mg by mouth daily.    . fluticasone (FLONASE) 50 MCG/ACT nasal spray Place 1 spray into both nostrils daily. 16 g 2  . metoprolol succinate (TOPROL-XL) 25 MG 24 hr tablet Take 1 tablet by mouth daily.    . RESTASIS 0.05 % ophthalmic emulsion 1 drop 2 (two) times daily.    . SYMBICORT 80-4.5 MCG/ACT inhaler Inhale 1 puff into the lungs daily.    Carlena Hurl 15 MG TABS tablet Take 15 mg by mouth 2 (two) times daily.      Social History   Socioeconomic History  . Marital status: Single    Spouse name: Not on file  . Number of children: Not on file  . Years of education: Not on file  . Highest education level: Not on file  Occupational History  . Not on file  Tobacco Use  . Smoking status: Former Smoker    Years: 1.00  . Smokeless tobacco: Never Used  Vaping Use  . Vaping Use:  Never used  Substance and Sexual Activity  . Alcohol use: Yes    Comment: socially  . Drug use: Yes    Types: Marijuana    Comment: occassionally  . Sexual activity: Yes    Birth control/protection: None, Inserts  Other Topics Concern  . Not on file  Social History Narrative  . Not on file   Social Determinants of Health   Financial Resource Strain: Not on file  Food Insecurity: Not on file  Transportation Needs:  Not on file  Physical Activity: Not on file  Stress: Not on file  Social Connections: Not on file  Intimate Partner Violence: Not on file   Family History  Problem Relation Age of Onset  . Hypertension Mother   . Cancer Mother        cervical  . Diabetes Mother   . Other Maternal Grandmother        pre diabetic  . Other Paternal Grandmother        brain tumor    OBJECTIVE:  Vitals:   05/17/20 0827 05/17/20 0828  BP: 123/82   Pulse: 83   Resp: 17   Temp: (!) 97.2 F (36.2 C)   TempSrc: Temporal   SpO2: 98%   Weight:  295 lb (133.8 kg)     General appearance: Alert, NAD, appears stated age Head: NCAT Throat: lips, mucosa, and tongue normal; teeth and gums normal Lungs: CTA bilaterally without adventitious breath sounds Heart: regular rate and rhythm.  Radial pulses 2+ symmetrical bilaterally Back: no CVA tenderness Abdomen: soft, non-tender; bowel sounds normal; no masses or organomegaly; no guarding or rebound tenderness GU: declines  Skin: warm and dry Psychological:  Alert and cooperative. Normal mood and affect.  LABS:  Results for orders placed or performed during the hospital encounter of 05/17/20  POCT urinalysis dipstick  Result Value Ref Range   Color, UA yellow yellow   Clarity, UA clear clear   Glucose, UA negative negative mg/dL   Bilirubin, UA negative negative   Ketones, POC UA negative negative mg/dL   Spec Grav, UA 7.628 3.151 - 1.025   Blood, UA negative negative   pH, UA 5.5 5.0 - 8.0   Protein Ur, POC negative negative mg/dL   Urobilinogen, UA 0.2 0.2 or 1.0 E.U./dL   Nitrite, UA Negative Negative   Leukocytes, UA Negative Negative  POCT urine pregnancy  Result Value Ref Range   Preg Test, Ur Negative Negative    Labs Reviewed  POCT URINALYSIS DIP (MANUAL ENTRY)  POCT URINE PREGNANCY  CERVICOVAGINAL ANCILLARY ONLY    ASSESSMENT & PLAN:  1. Dysuria   2. Urinary urgency   3. Urinary frequency   4. Acute vaginitis     Meds  ordered this encounter  Medications  . fluconazole (DIFLUCAN) 150 MG tablet    Sig: Take one tablet at the onset of symptoms. If symptoms are still present 3 days later, take the second tablet.    Dispense:  2 tablet    Refill:  0    Order Specific Question:   Supervising Provider    Answer:   Merrilee Jansky [id number]    Pending: Labs Reviewed  POCT URINALYSIS DIP (MANUAL ENTRY)  POCT URINE PREGNANCY  CERVICOVAGINAL ANCILLARY ONLY    UA negative in office today Fluconazole prescribed  Cytology self-swab obtained.   We will follow up with you regarding abnormal results Prescribed diflucan 150 mg once daily and then second dose 72 hours later Take medications as  prescribed and to completion If tests results are positive, please abstain from sexual activity until you and your partner(s) have been treated Follow up with PCP or [facility] if symptoms persists Return here or go to ER if you have any new or worsening symptoms fever, chills, nausea, vomiting, abdominal or pelvic pain, painful intercourse, persistent symptoms despite treatment Reviewed expectations re: course of current medical issues. Questions answered. Outlined signs and symptoms indicating need for more acute intervention. Patient verbalized understanding. After Visit Summary given.       Moshe Cipro, NP 05/17/20 [phone]

## 2020-05-17 NOTE — Discharge Instructions (Addendum)
Swab results will be back in about 2-3 days. We will be in contact with any abnormal results and will treat accordingly  I have sent in fluconazole in case of yeast. Take one tablet at the onset of symptoms. If symptoms are still present  in 3 days, take the second tablet.   May use Boric Acid suppositories daily for 30 days. Then may use 2-3 times per week for maintenance.  Follow up with PCP or GYN as needed.  Go to the Emergency Department if you experience severe pain, shortness of breath, high fever, or other concerns.

## 2020-05-19 LAB — CERVICOVAGINAL ANCILLARY ONLY
Bacterial Vaginitis (gardnerella): NEGATIVE
Candida Glabrata: NEGATIVE
Candida Vaginitis: POSITIVE — AB
Chlamydia: NEGATIVE
Comment: NEGATIVE
Comment: NEGATIVE
Comment: NEGATIVE
Comment: NEGATIVE
Comment: NEGATIVE
Comment: NORMAL
Neisseria Gonorrhea: NEGATIVE
Trichomonas: NEGATIVE

## 2020-06-04 ENCOUNTER — Other Ambulatory Visit: Payer: Self-pay

## 2020-06-04 ENCOUNTER — Encounter: Payer: Self-pay | Admitting: Adult Health

## 2020-06-04 ENCOUNTER — Ambulatory Visit (INDEPENDENT_AMBULATORY_CARE_PROVIDER_SITE_OTHER): Payer: 59 | Admitting: Adult Health

## 2020-06-04 VITALS — BP 130/81 | HR 98 | Ht 68.0 in | Wt 300.6 lb

## 2020-06-04 DIAGNOSIS — Z30019 Encounter for initial prescription of contraceptives, unspecified: Secondary | ICD-10-CM | POA: Diagnosis not present

## 2020-06-04 DIAGNOSIS — Z86711 Personal history of pulmonary embolism: Secondary | ICD-10-CM | POA: Insufficient documentation

## 2020-06-04 DIAGNOSIS — Z30011 Encounter for initial prescription of contraceptive pills: Secondary | ICD-10-CM | POA: Insufficient documentation

## 2020-06-04 HISTORY — DX: Encounter for initial prescription of contraceptive pills: Z30.011

## 2020-06-04 HISTORY — DX: Encounter for initial prescription of contraceptives, unspecified: Z30.019

## 2020-06-04 LAB — POCT URINE PREGNANCY: Preg Test, Ur: NEGATIVE

## 2020-06-04 MED ORDER — NORETHINDRONE 0.35 MG PO TABS: 1.0000 | ORAL_TABLET | Freq: Every day | ORAL | 11 refills | Status: DC

## 2020-06-04 NOTE — Progress Notes (Signed)
  Subjective:     Patient ID: Katie Price, female   DOB: 2000/06/01, 19 y.o.   MRN: [id number]  HPI Katie Price is a 20 year old black female, single, G0P0 in to discuss birth control, she had been using nuva ring since October 2021, and had sinus surgery about 3 weeks and then developed a PE, seen at [facility] and is on Xarelto.Has stopped nuva ring. PCP is Dr Margo Aye.  Review of Systems   Patient denies any headaches, hearing loss, fatigue, blurred vision, shortness of breath, chest pain, abdominal pain, problems with bowel movements, urination, or intercourse. No joint pain or mood swings.  Reviewed past medical,surgical, social and family history. Reviewed medications and allergies.  Objective:   Physical Exam BP 130/81 (BP Location: Left Arm, Patient Position: Sitting, Cuff Size: Normal)   Pulse 98   Ht 5\' 8"  (1.727 m)   Wt (!) 300 lb 9.6 oz (136.4 kg)   LMP 05/20/2020 (Exact Date)   BMI 45.71 kg/m UPT is negative Skin warm and dry. Neck: mid line trachea, normal thyroid, good ROM, no lymphadenopathy noted. Lungs: clear to ausculation bilaterally. Cardiovascular: regular rate and rhythm.  Upstream - 06/04/20 0925      Pregnancy Intention Screening   Does the patient want to become pregnant in the next year? No    Does the patient's partner want to become pregnant in the next year? No    Would the patient like to discuss contraceptive options today? Yes      Contraception Wrap Up   Current Method Oral Contraceptive   rx micronor hx PE   End Method Oral Contraceptive    Contraception Counseling Provided Yes             Assessment:     1. Encounter for initial prescription of contraceptives, unspecified contraceptive  2. Encounter for initial prescription of contraceptive pills Discussed options: IUD, depo, POP or nexplanon as per [facility] She opts for a pill Will rx Micronor, can start today, take same time every day and use condoms for at least 1 pack  Meds ordered this  encounter  Medications  . norethindrone (MICRONOR) 0.35 MG tablet    Sig: Take 1 tablet (0.35 mg total) by mouth daily.    Dispense:  28 tablet    Refill:  11    Order Specific Question:   Supervising Provider    Answer:   [facility] [[id number]]    3. History of pulmonary embolism On Xarelto    Plan:     Return in about 6 weeks for pap and physical and ROS

## 2020-06-17 DIAGNOSIS — R112 Nausea with vomiting, unspecified: Secondary | ICD-10-CM | POA: Diagnosis not present

## 2020-06-17 DIAGNOSIS — R197 Diarrhea, unspecified: Secondary | ICD-10-CM | POA: Diagnosis not present

## 2020-06-17 DIAGNOSIS — E869 Volume depletion, unspecified: Secondary | ICD-10-CM | POA: Diagnosis not present

## 2020-06-17 DIAGNOSIS — R911 Solitary pulmonary nodule: Secondary | ICD-10-CM | POA: Diagnosis not present

## 2020-06-17 DIAGNOSIS — R1031 Right lower quadrant pain: Secondary | ICD-10-CM | POA: Diagnosis not present

## 2020-06-17 DIAGNOSIS — R918 Other nonspecific abnormal finding of lung field: Secondary | ICD-10-CM | POA: Diagnosis not present

## 2020-06-19 DIAGNOSIS — R918 Other nonspecific abnormal finding of lung field: Secondary | ICD-10-CM | POA: Diagnosis not present

## 2020-06-19 DIAGNOSIS — R197 Diarrhea, unspecified: Secondary | ICD-10-CM | POA: Diagnosis not present

## 2020-06-20 DIAGNOSIS — R52 Pain, unspecified: Secondary | ICD-10-CM | POA: Diagnosis not present

## 2020-06-20 DIAGNOSIS — R457 State of emotional shock and stress, unspecified: Secondary | ICD-10-CM | POA: Diagnosis not present

## 2020-06-20 DIAGNOSIS — R0689 Other abnormalities of breathing: Secondary | ICD-10-CM | POA: Diagnosis not present

## 2020-06-25 DIAGNOSIS — R197 Diarrhea, unspecified: Secondary | ICD-10-CM | POA: Diagnosis not present

## 2020-07-02 ENCOUNTER — Ambulatory Visit (INDEPENDENT_AMBULATORY_CARE_PROVIDER_SITE_OTHER): Payer: 59 | Admitting: Allergy & Immunology

## 2020-07-02 ENCOUNTER — Telehealth: Payer: Self-pay

## 2020-07-02 ENCOUNTER — Encounter: Payer: Self-pay | Admitting: Allergy & Immunology

## 2020-07-02 ENCOUNTER — Other Ambulatory Visit: Payer: Self-pay

## 2020-07-02 VITALS — BP 118/72 | HR 88 | Temp 98.7°F | Resp 18 | Ht 68.0 in | Wt 293.6 lb

## 2020-07-02 DIAGNOSIS — T7800XA Anaphylactic reaction due to unspecified food, initial encounter: Secondary | ICD-10-CM | POA: Insufficient documentation

## 2020-07-02 DIAGNOSIS — J302 Other seasonal allergic rhinitis: Secondary | ICD-10-CM | POA: Diagnosis not present

## 2020-07-02 DIAGNOSIS — J3089 Other allergic rhinitis: Secondary | ICD-10-CM | POA: Diagnosis not present

## 2020-07-02 DIAGNOSIS — J4540 Moderate persistent asthma, uncomplicated: Secondary | ICD-10-CM

## 2020-07-02 DIAGNOSIS — H5203 Hypermetropia, bilateral: Secondary | ICD-10-CM | POA: Diagnosis not present

## 2020-07-02 DIAGNOSIS — J331 Polypoid sinus degeneration: Secondary | ICD-10-CM

## 2020-07-02 DIAGNOSIS — J4530 Mild persistent asthma, uncomplicated: Secondary | ICD-10-CM | POA: Insufficient documentation

## 2020-07-02 DIAGNOSIS — T7800XD Anaphylactic reaction due to unspecified food, subsequent encounter: Secondary | ICD-10-CM

## 2020-07-02 MED ORDER — TRELEGY ELLIPTA 200-62.5-25 MCG/INH IN AEPB: 1.0000 | INHALATION_SPRAY | Freq: Every day | RESPIRATORY_TRACT | 5 refills | Status: DC

## 2020-07-02 MED ORDER — TRIAMCINOLONE ACETONIDE 55 MCG/ACT NA AERO: 2.0000 | INHALATION_SPRAY | Freq: Every day | NASAL | 5 refills | Status: DC

## 2020-07-02 MED ORDER — AZELASTINE HCL 0.1 % NA SOLN: NASAL | 1 refills | Status: DC

## 2020-07-02 NOTE — Progress Notes (Signed)
NEW PATIENT  Date of Service/Encounter:  07/02/20  Consult requested by: Benita Stabile, MD   Assessment:   Mild persistent asthma, uncomplicated  Seasonal and perennial allergic rhinitis (grasses, ragweed, weeds, trees, indoor molds, outdoor molds, dust mites, cat, dog and cockroach)  Anaphylactic shock due to food (egg) - confirming the negative skin testing with blood work  Plan/Recommendations:   1. Moderate persistent asthma, uncomplicated - Lung testing looked somewhat decent.  - We are going to change you from Symbicort (which is supposed to be used TWO times daily) to Trelegy, which is meant to be used only once daily. - Daily controller medication(s): Trelegy 200/62.5/25 one puff once daily - Prior to physical activity: albuterol 2 puffs 10-15 minutes before physical activity. - Rescue medications: albuterol 4 puffs every 4-6 hours as needed - Asthma control goals:  * Full participation in all desired activities (may need albuterol before activity) * Albuterol use two time or less a week on average (not counting use with activity) * Cough interfering with sleep two time or less a month * Oral steroids no more than once a year * No hospitalizations  2. Seasonal and perennial allergic rhinitis - Testing today showed: grasses, ragweed, weeds, trees, indoor molds, outdoor molds, dust mites, cat, dog and cockroach - Copy of test results provided.  - Avoidance measures provided. - Stop taking: Flonase (fluticasone) - Continue with: Allegra (fexofenadine) 180mg  tablet once daily - Start taking: Nasacort (triamcinolone) two sprays per nostril daily and Astelin (azelastine) 2 sprays per nostril 1-2 times daily as needed - You can use an extra dose of the antihistamine, if needed, for breakthrough symptoms.  - Consider nasal saline rinses 1-2 times daily to remove allergens from the nasal cavities as well as help with mucous clearance (this is especially helpful to do before  the nasal sprays are given) - Consider allergy shots as a means of long-term control. - Allergy shots "re-train" and "reset" the immune system to ignore environmental allergens and decrease the resulting immune response to those allergens (sneezing, itchy watery eyes, runny nose, nasal congestion, etc).    - Allergy shots improve symptoms in 75-85% of patients.  - Talk to your insurance company to see how much they would be, but since you have Medicaid AND [employer], this might be a good time to start them since the large expense is the initial set of vials.   3. Anaphylactic shock due to food - Testing was negative to egg. - We are going to get labs to confirm this. - We will call you when the results come back. - We will hold off on an EpiPen at this time.   4. Return in about 2 months (around 09/01/2020).    This note in its entirety was forwarded to the Provider who requested this consultation.  Subjective:   Katie Price is a 20 y.o. female presenting today for evaluation of  Chief Complaint  Patient presents with  . Asthma    Allergy Testing     Katie Price has a history of the following: Patient Active Problem List   Diagnosis Date Noted  . Polypoid sinus degeneration 07/02/2020  . Anaphylactic shock due to adverse food reaction 07/02/2020  . Seasonal and perennial allergic rhinitis 07/02/2020  . Mild persistent asthma, uncomplicated 07/02/2020  . History of pulmonary embolism 06/04/2020  . Encounter for initial prescription of contraceptive pills 06/04/2020  . Encounter for initial prescription of contraceptives 06/04/2020  . Irregular bleeding  11/01/2019  . Encounter for initial prescription of vaginal ring hormonal contraceptive 11/01/2019  . Menorrhagia with irregular cycle 06/15/2017  . Pregnancy examination or test, negative result 06/15/2017  . Screening examination for STD (sexually transmitted disease) 06/15/2017  . DMDD (disruptive mood dysregulation disorder)  (HCC) 09/17/2016  . MDD (major depressive disorder) 09/12/2016  . Bipolar 1 disorder (HCC) 04/10/2016  . Irritability and anger 04/10/2016  . ODD (oppositional defiant disorder) 05/14/2014  . Bipolar 1 disorder, depressed, moderate (HCC) 05/14/2014  . Lives in group home 04/12/2014  . Obesity 04/12/2014  . Eczema 04/12/2014  . Seasonal allergies 04/12/2014  . Acanthosis nigricans 04/12/2014  . Failed vision screen 04/12/2014    History obtained from: chart review and patient.  Katie Price was referred by Benita Stabile, MD.     Katie Price is a 20 y.o. female presenting for an evaluation of allergies and asthma.   Asthma/Respiratory Symptom History: She has a history of astham that started in 2019. She was working [employer]. This is the [employer]. She reports that she has having wheezing and SOB. She was given a nebulizer and ProAir to use as needed. She was not put on anything every day. She was placed on Symbicort which was started in 2021. She is on one puff once daily of Symbicort. She does not use a spacer at all. She takes this in the morning. This seems to control her symptoms well. 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    Allergic Rhinitis Symptom History: She reports that she has had severe allergies for years. This has been an ongoing issue when exposed to dust. She was using Flonase which she uses as needed. She is using Allegra daily. Symptoms are throughout the year. She does get some sinus infections occasionally before the surgery. She has never been tallergy tested.   She did have sinus surgery recently from Dr. Suszanne Conners. This was done in March 2022. She does still have allergies but she does not have the sinus issues. She had polyps that were removed that were hanging out of her nose.   Food Allergy Symptom History: She reports that she has vomiting and diarrhea whenever she eats eggs. This is a consistent reaction and it has happened on a  number of occasions. She has never been tested for egg allergy in the past and she does not have an EpiPen.    Eczema Symptom History: She does have a history of eczema which is worse on her arms.  It is under fairly good control now.  She has a template triamcinolone to use as needed.  She has never seen a dermatologist.  Otherwise, there is no history of other atopic diseases, including drug allergies, stinging insect allergies, urticaria or contact dermatitis. There is no significant infectious history. Vaccinations are up to date.    Past Medical History: Patient Active Problem List   Diagnosis Date Noted  . Polypoid sinus degeneration 07/02/2020  . Anaphylactic shock due to adverse food reaction 07/02/2020  . Seasonal and perennial allergic rhinitis 07/02/2020  . Mild persistent asthma, uncomplicated 07/02/2020  . History of pulmonary embolism 06/04/2020  . Encounter for initial prescription of contraceptive pills 06/04/2020  . Encounter for initial prescription of contraceptives 06/04/2020  . Irregular bleeding 11/01/2019  . Encounter for initial prescription of vaginal ring hormonal contraceptive 11/01/2019  . Menorrhagia with irregular cycle 06/15/2017  . Pregnancy examination or test, negative result 06/15/2017  .  Screening examination for STD (sexually transmitted disease) 06/15/2017  . DMDD (disruptive mood dysregulation disorder) (HCC) 09/17/2016  . MDD (major depressive disorder) 09/12/2016  . Bipolar 1 disorder (HCC) 04/10/2016  . Irritability and anger 04/10/2016  . ODD (oppositional defiant disorder) 05/14/2014  . Bipolar 1 disorder, depressed, moderate (HCC) 05/14/2014  . Lives in group home 04/12/2014  . Obesity 04/12/2014  . Eczema 04/12/2014  . Seasonal allergies 04/12/2014  . Acanthosis nigricans 04/12/2014  . Failed vision screen 04/12/2014    Medication List:  Allergies as of 07/02/2020      Reactions   Adhesive [tape] Rash   Latex Itching       Medication List       Accurate as of July 02, 2020  4:42 PM. If you have any questions, ask your nurse or doctor.        albuterol (2.5 MG/3ML) 0.083% nebulizer solution Commonly known as: PROVENTIL SMARTSIG:1 Vial(s) Via Nebulizer Every 4-6 Hours PRN   azelastine 0.1 % nasal spray Commonly known as: ASTELIN 2 sprays per nostril 1-2 times daily as needed. Started by: Alfonse Spruce, MD   fexofenadine 180 MG tablet Commonly known as: ALLEGRA Take 180 mg by mouth daily.   fluticasone 50 MCG/ACT nasal spray Commonly known as: FLONASE Place 1 spray into both nostrils daily.   norethindrone 0.35 MG tablet Commonly known as: MICRONOR Take 1 tablet (0.35 mg total) by mouth daily.   ondansetron 4 MG disintegrating tablet Commonly known as: ZOFRAN-ODT Take 4 mg by mouth every 8 (eight) hours.   Restasis 0.05 % ophthalmic emulsion Generic drug: cycloSPORINE 1 drop 2 (two) times daily.   Symbicort 80-4.5 MCG/ACT inhaler Generic drug: budesonide-formoterol Inhale 1 puff into the lungs daily.   Trelegy Ellipta 200-62.5-25 MCG/INH Aepb Generic drug: Fluticasone-Umeclidin-Vilant Inhale 1 puff into the lungs daily. Started by: Alfonse Spruce, MD   Xarelto 20 MG Tabs tablet Generic drug: rivaroxaban Take 20 mg by mouth daily.       Birth History: non-contributory  Developmental History: non-contributory  Past Surgical History: Past Surgical History:  Procedure Laterality Date  . ETHMOIDECTOMY Right 03/31/2020   Procedure: RIGHT TOTAL ETHMOIDECTOMY AND FRONTAL RECESS exploration;  Surgeon: Newman Pies, MD;  Location: [facility];  Service: ENT;  Laterality: Right;  . MAXILLARY ANTROSTOMY Bilateral 03/31/2020   Procedure: ENDOSCOPIC MAXILLARY ANTROSTOMY WITH TISSUE REMOVAL;  Surgeon: Newman Pies, MD;  Location: [facility];  Service: ENT;  Laterality: Bilateral;  . SINUS ENDO WITH FUSION Bilateral 03/31/2020   Procedure: SINUS ENDOSCOPY WITH  FUSION NAVIGATION;  Surgeon: Newman Pies, MD;  Location: [facility];  Service: ENT;  Laterality: Bilateral;  . TONSILLECTOMY AND ADENOIDECTOMY N/A 08/02/2016   Procedure: TONSILLECTOMY AND ADENOIDECTOMY;  Surgeon: Newman Pies, MD;  Location: [facility];  Service: ENT;  Laterality: N/A;  . TURBINATE REDUCTION Bilateral 03/31/2020   Procedure: TURBINATE REDUCTION;  Surgeon: Newman Pies, MD;  Location: [facility];  Service: ENT;  Laterality: Bilateral;  . WISDOM TOOTH EXTRACTION       Family History: Family History  Problem Relation Age of Onset  . Hypertension Mother   . Cancer Mother        cervical  . Diabetes Mother   . Other Maternal Grandmother        pre diabetic  . Other Paternal Grandmother        brain tumor     Social History: Kareem lives at home with her  family.  She does have a house that is 21 years old.  There is wood throughout the home.  She has gas heating and central cooling.  There are no animals inside or outside of the home.  There are dust mite covers on the bed, but not the pillows.  She is not exposed to tobacco at all.  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  She is not exposed to fumes, chemicals, or dust.  She does use a HEPA filter in the home.  She was a smoker from 292015 through 2021.  Review of Systems  Constitutional: Negative.  Negative for chills, fever, malaise/fatigue and weight loss.  HENT: Positive for congestion and sinus pain. Negative for ear discharge and ear pain.   Eyes: Negative for pain, discharge and redness.  Respiratory: Positive for cough. Negative for sputum production, shortness of breath and wheezing.   Cardiovascular: Negative.  Negative for chest pain and palpitations.  Gastrointestinal: Negative for abdominal pain, constipation, diarrhea, heartburn, nausea and vomiting.  Skin: Positive for itching and rash.  Neurological: Negative for dizziness and headaches.  Endo/Heme/Allergies:  Positive for environmental allergies. Does not bruise/bleed easily.       Objective:   Blood pressure 118/72, pulse 88, temperature 98.7 F (37.1 C), temperature source Temporal, resp. rate 18, height 5\' 8"  (1.727 m), weight 293 lb 9.6 oz (133.2 kg), SpO2 96 %. Body mass index is 44.64 kg/m.   Physical Exam:   Physical Exam Constitutional:      Appearance: She is well-developed. She is obese.     Comments: Pleasant female.  Cooperative.  HENT:     Head: Normocephalic and atraumatic.     Right Ear: Tympanic membrane, ear canal and external ear normal. No drainage, swelling or tenderness. Tympanic membrane is not injected, scarred, erythematous, retracted or bulging.     Left Ear: Tympanic membrane, ear canal and external ear normal. No drainage, swelling or tenderness. Tympanic membrane is not injected, scarred, erythematous, retracted or bulging.     Nose: No nasal deformity, septal deviation, mucosal edema or rhinorrhea.     Right Turbinates: Enlarged, swollen and pale.     Left Turbinates: Enlarged, swollen and pale.     Right Sinus: No maxillary sinus tenderness or frontal sinus tenderness.     Left Sinus: No maxillary sinus tenderness or frontal sinus tenderness.     Comments: Polypoid tissue present.    Mouth/Throat:     Lips: Pink.     Mouth: Mucous membranes are moist. Mucous membranes are not pale and not dry.     Pharynx: Uvula midline.     Comments: Moderate cobblestoning the posterior oropharynx. Eyes:     General:        Right eye: No discharge.        Left eye: No discharge.     Conjunctiva/sclera: Conjunctivae normal.     Right eye: Right conjunctiva is not injected. No chemosis.    Left eye: Left conjunctiva is not injected. No chemosis.    Pupils: Pupils are equal, round, and reactive to light.  Cardiovascular:     Rate and Rhythm: Normal rate and regular rhythm.     Heart sounds: Normal heart sounds.  Pulmonary:     Effort: Pulmonary effort is normal.  No tachypnea, accessory muscle usage or respiratory distress.     Breath sounds: Normal breath sounds. No wheezing, rhonchi or rales.  Chest:     Chest wall: No  tenderness.  Abdominal:     Tenderness: There is no abdominal tenderness. There is no guarding or rebound.  Lymphadenopathy:     Head:     Right side of head: No submandibular, tonsillar or occipital adenopathy.     Left side of head: No submandibular, tonsillar or occipital adenopathy.     Cervical: No cervical adenopathy.  Skin:    General: Skin is warm.     Capillary Refill: Capillary refill takes less than 2 seconds.     Coloration: Skin is not pale.     Findings: Rash present. No abrasion, erythema or petechiae. Rash is not papular, urticarial or vesicular.     Comments: She does have some hyperpigmented lesions in her antecubital fossa with some hyperkeratotic skin.  Neurological:     Mental Status: She is alert.  Psychiatric:        Behavior: Behavior is cooperative.      Diagnostic studies:   Spirometry: results abnormal (FEV1: 2.36/72%, FVC: 2.73/73%, FEV1/FVC: 86%).    Spirometry consistent with possible restrictive disease.  Allergy Studies:     Airborne Adult Perc - 07/02/20 1455    Time Antigen Placed 1455    Allergen Manufacturer Waynette Buttery    Location Back    Number of Test 59    1. Control-Buffer 50% Glycerol Negative    2. Control-Histamine 1 mg/ml 2+    3. Albumin saline Negative    4. Bahia 3+    5. French Southern Territories Negative    6. Johnson 3+    7. Kentucky Blue 4+    8. Meadow Fescue 2+    9. Perennial Rye 4+    10. Sweet Vernal 4+    11. Timothy 4+    12. Cocklebur Negative    13. Burweed Marshelder Negative    14. Ragweed, short Negative    15. Ragweed, Giant Negative    16. Plantain,  English Negative    17. Lamb's Quarters Negative    18. Sheep Sorrell Negative    19. Rough Pigweed Negative    20. Marsh Elder, Rough Negative    21. Mugwort, Common Negative    22. Ash mix Negative    23.  Birch mix Negative    24. Beech American Negative    25. Box, Elder Negative    26. Cedar, red 3+    27. Cottonwood, Guinea-Bissau Negative    28. Elm mix Negative    29. Hickory 4+    30. Maple mix Negative    31. Oak, Guinea-Bissau mix Negative    32. Pecan Pollen 4+    33. Pine mix 4+    34. Sycamore Eastern Negative    35. Walnut, Black Pollen 2+    36. Alternaria alternata Negative    37. Cladosporium Herbarum Negative    38. Aspergillus mix Negative    39. Penicillium mix Negative    40. Bipolaris sorokiniana (Helminthosporium) Negative    41. Drechslera spicifera (Curvularia) Negative    42. Mucor plumbeus Negative    43. Fusarium moniliforme Negative    44. Aureobasidium pullulans (pullulara) Negative    45. Rhizopus oryzae Negative    46. Botrytis cinera Negative    47. Epicoccum nigrum Negative    48. Phoma betae Negative    49. Candida Albicans Negative    50. Trichophyton mentagrophytes Negative    51. Mite, D Farinae  5,000 AU/ml Negative    52. Mite, D Pteronyssinus  5,000 AU/ml Negative    53.  Cat Hair 10,000 BAU/ml 3+    54.  Dog Epithelia Negative    55. Mixed Feathers Negative    56. Horse Epithelia Negative    57. Cockroach, German Negative    58. Mouse Negative    59. Tobacco Leaf Negative    Comments Egg white: NEGATIVE          Intradermal - 07/02/20 1518    Time Antigen Placed 1519    Allergen Manufacturer Waynette Buttery    Location Arm    Number of Test 12    Control Negative    French [place] 3+    Ragweed mix 3+    Weed mix 3+    Mold 1 4+    Mold 2 4+    Mold 3 4+    Mold 4 4+    Dog 2+    Cockroach 2+    Mite mix 4+    Other Omitted           Allergy testing results were read and interpreted by myself, documented by clinical staff.         Malachi Bonds, MD [facility]

## 2020-07-02 NOTE — Patient Instructions (Addendum)
1. Moderate persistent asthma, uncomplicated - Lung testing looked somewhat decent.  - We are going to change you from Symbicort (which is supposed to be used TWO times daily) to Trelegy, which is meant to be used only once daily. - Daily controller medication(s): Trelegy 200/62.5/25 one puff once daily - Prior to physical activity: albuterol 2 puffs 10-15 minutes before physical activity. - Rescue medications: albuterol 4 puffs every 4-6 hours as needed - Asthma control goals:  * Full participation in all desired activities (may need albuterol before activity) * Albuterol use two time or less a week on average (not counting use with activity) * Cough interfering with sleep two time or less a month * Oral steroids no more than once a year * No hospitalizations  2. Seasonal and perennial allergic rhinitis - Testing today showed: grasses, ragweed, weeds, trees, indoor molds, outdoor molds, dust mites, cat, dog and cockroach - Copy of test results provided.  - Avoidance measures provided. - Stop taking: Flonase (fluticasone) - Continue with: Allegra (fexofenadine) 180mg  tablet once daily - Start taking: Nasacort (triamcinolone) two sprays per nostril daily and Astelin (azelastine) 2 sprays per nostril 1-2 times daily as needed - You can use an extra dose of the antihistamine, if needed, for breakthrough symptoms.  - Consider nasal saline rinses 1-2 times daily to remove allergens from the nasal cavities as well as help with mucous clearance (this is especially helpful to do before the nasal sprays are given) - Consider allergy shots as a means of long-term control. - Allergy shots "re-train" and "reset" the immune system to ignore environmental allergens and decrease the resulting immune response to those allergens (sneezing, itchy watery eyes, runny nose, nasal congestion, etc).    - Allergy shots improve symptoms in 75-85% of patients.  - Talk to your insurance company to see how much they  would be, but since you have Medicaid AND [employer], this might be a good time to start them since the large expense is the initial set of vials.   3. Anaphylactic shock due to food - Testing was negative to egg. - We are going to get labs to confirm this. - We will call you when the results come back. - We will hold off on an EpiPen at this time.   4. Return in about 2 months (around 09/01/2020).    Please inform 11/01/2020 of any Emergency Department visits, hospitalizations, or changes in symptoms. Call us before going to the ED for breathing or allergy symptoms since we might be able to fit you in for a sick visit. Feel free to contact us anytime with any questions, problems, or concerns.  It was a pleasure to meet you today!  Websites that have reliable patient information: 1. American Academy of Asthma, Allergy, and Immunology: www.aaaai.org 2. Food Allergy Research and Education (FARE): foodallergy.org 3. Mothers of Asthmatics: http://www.asthmacommunitynetwork.org 4. American College of Allergy, Asthma, and Immunology: www.acaai.org   COVID-19 Vaccine Information can be found at: [place] For questions related to vaccine distribution or appointments, please email [email or web address] or call [phone].   We realize that you might be concerned about having an allergic reaction to the COVID19 vaccines. To help with that concern, WE ARE OFFERING THE COVID19 VACCINES IN OUR OFFICE! Ask the front desk for dates!     "Like" [phone] on Facebook and Instagram for our latest updates!      A healthy democracy works best when [place] participate! Make sure you are registered to vote! If  you have moved or changed any of your contact information, you will need to get this updated before voting!  In some cases, you MAY be able to register to vote online: [email or web address]     1. Control-Buffer 50%  Glycerol Negative   2. Control-Histamine 1 mg/ml 2+   3. Albumin saline Negative   4. [place] 3+   5. [place] Negative   6. Johnson 3+   7. [place] Blue 4+   8. Meadow Fescue 2+   9. Perennial Rye 4+   10. Sweet Vernal 4+   11. Timothy 4+   12. Cocklebur Negative   13. Burweed Marshelder Negative   14. Ragweed, short Negative   15. Ragweed, Giant Negative   16. Plantain,  English Negative   17. Lamb's Quarters Negative   18. Sheep Sorrell Negative   19. Rough Pigweed Negative   20. Marsh Elder, Rough Negative   21. Mugwort, Common Negative   22. Ash mix Negative   23. Birch mix Negative   24. Beech American Negative   25. Box, Elder Negative   26. Cedar, red 3+   27. Cottonwood, [place] Negative   28. Elm mix Negative   29. Hickory 4+   30. Maple mix Negative   31. Oak, [place] mix Negative   32. Pecan Pollen 4+   33. Pine mix 4+   34. Sycamore Eastern Negative   35. Walnut, Black Pollen 2+   36. Alternaria alternata Negative   37. Cladosporium Herbarum Negative   38. Aspergillus mix Negative   39. Penicillium mix Negative   40. Bipolaris sorokiniana (Helminthosporium) Negative   41. Drechslera spicifera (Curvularia) Negative   42. Mucor plumbeus Negative   43. Fusarium moniliforme Negative   44. Aureobasidium pullulans (pullulara) Negative   45. Rhizopus oryzae Negative   46. Botrytis cinera Negative   47. Epicoccum nigrum Negative   48. Phoma betae Negative   49. Candida Albicans Negative   50. Trichophyton mentagrophytes Negative   51. Mite, D Farinae  5,000 AU/ml Negative   52. Mite, D Pteronyssinus  5,000 AU/ml Negative   53. Cat Hair 10,000 BAU/ml 3+   54.  Dog Epithelia Negative   55. Mixed Feathers Negative   56. Horse Epithelia Negative   57. Cockroach, German Negative   58. Mouse Negative   59. Tobacco Leaf Negative   Comments Egg white: NEGATIVE     Control Negative   [place] 3+   Ragweed mix 3+   Weed mix 3+   Mold 1 4+   Mold 2 4+   Mold  3 4+   Mold 4 4+   Dog 2+   Cockroach 2+   Mite mix 4+     Reducing Pollen Exposure  The American Academy of Allergy, Asthma and Immunology suggests the following steps to reduce your exposure to pollen during allergy seasons.    1. Do not hang sheets or clothing out to dry; pollen may collect on these items. 2. Do not mow lawns or spend time around freshly cut grass; mowing stirs up pollen. 3. Keep windows closed at night.  Keep car windows closed while driving. 4. Minimize morning activities outdoors, a time when pollen counts are usually at their highest. 5. Stay indoors as much as possible when pollen counts or humidity is high and on windy days when pollen tends to remain in the air longer. 6. Use air conditioning when possible.  Many air conditioners have filters that trap the pollen spores. 7.  Use a HEPA room air filter to remove pollen form the indoor air you breathe.  Control of Mold Allergen   Mold and fungi can grow on a variety of surfaces provided certain temperature and moisture conditions exist.  Outdoor molds grow on plants, decaying vegetation and soil.  The major outdoor mold, Alternaria and Cladosporium, are found in very high numbers during hot and dry conditions.  Generally, a late Summer - Fall peak is seen for common outdoor fungal spores.  Rain will temporarily lower outdoor mold spore count, but counts rise rapidly when the rainy period ends.  The most important indoor molds are Aspergillus and Penicillium.  Dark, humid and poorly ventilated basements are ideal sites for mold growth.  The next most common sites of mold growth are the bathroom and the kitchen.  Outdoor (Seasonal) Mold Control  Positive outdoor molds via skin testing: Alternaria, Cladosporium, Bipolaris (Helminthsporium), Drechslera (Curvalaria) and Mucor  1. Use air conditioning and keep windows closed 2. Avoid exposure to decaying vegetation. 3. Avoid leaf raking. 4. Avoid grain  handling. 5. Consider wearing a face mask if working in moldy areas.  6.   Indoor (Perennial) Mold Control   Positive indoor molds via skin testing: Aspergillus, Penicillium, Fusarium, Aureobasidium (Pullulara) and Rhizopus  1. Maintain humidity below 50%. 2. Clean washable surfaces with 5% bleach solution. 3. Remove sources e.g. contaminated carpets.     Control of Dog or Cat Allergen  Avoidance is the best way to manage a dog or cat allergy. If you have a dog or cat and are allergic to dog or cats, consider removing the dog or cat from the home. If you have a dog or cat but don't want to find it a new home, or if your family wants a pet even though someone in the household is allergic, here are some strategies that may help keep symptoms at bay:  1. Keep the pet out of your bedroom and restrict it to only a few rooms. Be advised that keeping the dog or cat in only one room will not limit the allergens to that room. 2. Don't pet, hug or kiss the dog or cat; if you do, wash your hands with soap and water. 3. High-efficiency particulate air (HEPA) cleaners run continuously in a bedroom or living room can reduce allergen levels over time. 4. Regular use of a high-efficiency vacuum cleaner or a central vacuum can reduce allergen levels. 5. Giving your dog or cat a bath at least once a week can reduce airborne allergen.  Control of Dust Mite Allergen    Dust mites play a major role in allergic asthma and rhinitis.  They occur in environments with high humidity wherever human skin is found.  Dust mites absorb humidity from the atmosphere (ie, they do not drink) and feed on organic matter (including shed human and animal skin).  Dust mites are a microscopic type of insect that you cannot see with the naked eye.  High levels of dust mites have been detected from mattresses, pillows, carpets, upholstered furniture, bed covers, clothes, soft toys and any woven material.  The principal allergen  of the dust mite is found in its feces.  A gram of dust may contain 1,000 mites and 250,000 fecal particles.  Mite antigen is easily measured in the air during house cleaning activities.  Dust mites do not bite and do not cause harm to humans, other than by triggering allergies/asthma.    Ways to decrease your exposure to  dust mites in your home:  1. Encase mattresses, box springs and pillows with a mite-impermeable barrier or cover   2. Wash sheets, blankets and drapes weekly in hot water (130 F) with detergent and dry them in a dryer on the hot setting.  3. Have the room cleaned frequently with a vacuum cleaner and a damp dust-mop.  For carpeting or rugs, vacuuming with a vacuum cleaner equipped with a high-efficiency particulate air (HEPA) filter.  The dust mite allergic individual should not be in a room which is being cleaned and should wait 1 hour after cleaning before going into the room. 4. Do not sleep on upholstered furniture (eg, couches).   5. If possible removing carpeting, upholstered furniture and drapery from the home is ideal.  Horizontal blinds should be eliminated in the rooms where the person spends the most time (bedroom, study, television room).  Washable vinyl, roller-type shades are optimal. 6. Remove all non-washable stuffed toys from the bedroom.  Wash stuffed toys weekly like sheets and blankets above.   7. Reduce indoor humidity to less than 50%.  Inexpensive humidity monitors can be purchased at most hardware stores.  Do not use a humidifier as can make the problem worse and are not recommended.  Control of Cockroach Allergen  Cockroach allergen has been identified as an important cause of acute attacks of asthma, especially in urban settings.  There are fifty-five species of cockroach that exist in the [place], however only three, the [place], [place] species produce allergen that can affect patients with Asthma.  Allergens can be obtained from fecal  particles, egg casings and secretions from cockroaches.    1. Remove food sources. 2. Reduce access to water. 3. Seal access and entry points. 4. Spray runways with 0.5-1% Diazinon or Chlorpyrifos 5. Blow boric acid power under stoves and refrigerator. 6. Place bait stations (hydramethylnon) at feeding sites.  Allergy Shots   Allergies are the result of a chain reaction that starts in the immune system. Your immune system controls how your body defends itself. For instance, if you have an allergy to pollen, your immune system identifies pollen as an invader or allergen. Your immune system overreacts by producing antibodies called Immunoglobulin E (IgE). These antibodies travel to cells that release chemicals, causing an allergic reaction.  The concept behind allergy immunotherapy, whether it is received in the form of shots or tablets, is that the immune system can be desensitized to specific allergens that trigger allergy symptoms. Although it requires time and patience, the payback can be long-term relief.  How Do Allergy Shots Work?  Allergy shots work much like a vaccine. Your body responds to injected amounts of a particular allergen given in increasing doses, eventually developing a resistance and tolerance to it. Allergy shots can lead to decreased, minimal or no allergy symptoms.  There generally are two phases: build-up and maintenance. Build-up often ranges from three to six months and involves receiving injections with increasing amounts of the allergens. The shots are typically given once or twice a week, though more rapid build-up schedules are sometimes used.  The maintenance phase begins when the most effective dose is reached. This dose is different for each person, depending on how allergic you are and your response to the build-up injections. Once the maintenance dose is reached, there are longer periods between injections, typically two to four weeks.  Occasionally doctors  give cortisone-type shots that can temporarily reduce allergy symptoms. These types of shots are different  and should not be confused with allergy immunotherapy shots.  Who Can Be Treated with Allergy Shots?  Allergy shots may be a good treatment approach for people with allergic rhinitis (hay fever), allergic asthma, conjunctivitis (eye allergy) or stinging insect allergy.   Before deciding to begin allergy shots, you should consider:  . The length of allergy season and the severity of your symptoms . Whether medications and/or changes to your environment can control your symptoms . Your desire to avoid long-term medication use . Time: allergy immunotherapy requires a major time commitment . Cost: may vary depending on your insurance coverage  Allergy shots for children age 8 and older are effective and often well tolerated. They might prevent the onset of new allergen sensitivities or the progression to asthma.  Allergy shots are not started on patients who are pregnant but can be continued on patients who become pregnant while receiving them. In some patients with other medical conditions or who take certain common medications, allergy shots may be of risk. It is important to mention other medications you talk to your allergist.   When Will I Feel Better?  Some may experience decreased allergy symptoms during the build-up phase. For others, it may take as long as 12 months on the maintenance dose. If there is no improvement after a year of maintenance, your allergist will discuss other treatment options with you.  If you aren't responding to allergy shots, it may be because there is not enough dose of the allergen in your vaccine or there are missing allergens that were not identified during your allergy testing. Other reasons could be that there are high levels of the allergen in your environment or major exposure to non-allergic triggers like tobacco smoke.  What Is the Length of  Treatment?  Once the maintenance dose is reached, allergy shots are generally continued for three to five years. The decision to stop should be discussed with your allergist at that time. Some people may experience a permanent reduction of allergy symptoms. Others may relapse and a longer course of allergy shots can be considered.  What Are the Possible Reactions?  The two types of adverse reactions that can occur with allergy shots are local and systemic. Common local reactions include very mild redness and swelling at the injection site, which can happen immediately or several hours after. A systemic reaction, which is less common, affects the entire body or a particular body system. They are usually mild and typically respond quickly to medications. Signs include increased allergy symptoms such as sneezing, a stuffy nose or hives.  Rarely, a serious systemic reaction called anaphylaxis can develop. Symptoms include swelling in the throat, wheezing, a feeling of tightness in the chest, nausea or dizziness. Most serious systemic reactions develop within 30 minutes of allergy shots. This is why it is strongly recommended you wait in your doctor's office for 30 minutes after your injections. Your allergist is trained to watch for reactions, and his or her staff is trained and equipped with the proper medications to identify and treat them.  Who Should Administer Allergy Shots?  The preferred location for receiving shots is your prescribing allergist's office. Injections can sometimes be given at another facility where the physician and staff are trained to recognize and treat reactions, and have received instructions by your prescribing allergist.

## 2020-07-02 NOTE — Telephone Encounter (Signed)
PA submitted thru cover my meds for trelegy 200 waiting on repsonse

## 2020-07-02 NOTE — Addendum Note (Signed)
Addended by: Grier Rocher on: 07/02/2020 04:49 PM   Modules accepted: Orders

## 2020-07-04 NOTE — Telephone Encounter (Signed)
As cancelled as a pa is not required for the medications per [facility]

## 2020-07-15 ENCOUNTER — Other Ambulatory Visit: Payer: Self-pay | Admitting: Physician Assistant

## 2020-07-15 DIAGNOSIS — B9689 Other specified bacterial agents as the cause of diseases classified elsewhere: Secondary | ICD-10-CM

## 2020-07-15 DIAGNOSIS — J019 Acute sinusitis, unspecified: Secondary | ICD-10-CM

## 2020-07-15 MED ORDER — FLUCONAZOLE 150 MG PO TABS: 150.0000 mg | ORAL_TABLET | Freq: Once | ORAL | 0 refills | Status: AC

## 2020-07-15 MED ORDER — AMOXICILLIN-POT CLAVULANATE 875-125 MG PO TABS: 1.0000 | ORAL_TABLET | Freq: Two times a day (BID) | ORAL | 0 refills | Status: DC

## 2020-07-21 ENCOUNTER — Other Ambulatory Visit: Payer: Self-pay

## 2020-07-21 ENCOUNTER — Ambulatory Visit
Admission: EM | Admit: 2020-07-21 | Discharge: 2020-07-21 | Disposition: A | Discharge status: Home / Self Care | Payer: 59 | Attending: Family Medicine | Admitting: Family Medicine

## 2020-07-21 DIAGNOSIS — H9202 Otalgia, left ear: Secondary | ICD-10-CM

## 2020-07-21 DIAGNOSIS — Z1152 Encounter for screening for COVID-19: Secondary | ICD-10-CM | POA: Diagnosis not present

## 2020-07-21 DIAGNOSIS — J45901 Unspecified asthma with (acute) exacerbation: Secondary | ICD-10-CM

## 2020-07-21 DIAGNOSIS — J310 Chronic rhinitis: Secondary | ICD-10-CM

## 2020-07-21 MED ORDER — PREDNISONE 20 MG PO TABS: 20.0000 mg | ORAL_TABLET | Freq: Every day | ORAL | 0 refills | Status: AC

## 2020-07-21 MED ORDER — PSEUDOEPHEDRINE HCL ER 120 MG PO TB12: 120.0000 mg | ORAL_TABLET | Freq: Two times a day (BID) | ORAL | 0 refills | Status: AC

## 2020-07-21 NOTE — ED Provider Notes (Signed)
[facility]    CSN: [id number] Arrival date & time: 07/21/20  0847      History   Chief Complaint Chief Complaint  Patient presents with   Nasal Congestion    HPI Katie Price is a 20 y.o. female.   HPI Patient presents today with concern of worsening nasal congestion, left ear pain and laryngitis along with upper chest wall burning.  Patient is currently being treated for acute sinusitis with an oral antibiotic and has approximately 3 doses remaining.  She reports since starting the medication she developed after mentioned symptoms.  She is been taking Mucinex, Robitussin and cough medication.  She is also followed by asthma allergy and is prescribed Astelin nasal spray for management of chronic rhinitis and reports she has been using spray along with inhalers as prescribed.  She is also having intermittent wheezing and a nonproductive cough.  She remains afebrile.  Request COVID testing.  Patient has not had any known COVID exposure.  Past Medical History:  Diagnosis Date   Asthma    Bipolar disorder (HCC)    PE (pulmonary thromboembolism) (HCC)    Tonsillar and adenoid hypertrophy 07/2016   snores during sleep, denies apnea    Patient Active Problem List   Diagnosis Date Noted   Polypoid sinus degeneration 07/02/2020   Anaphylactic shock due to adverse food reaction 07/02/2020   Seasonal and perennial allergic rhinitis 07/02/2020   Mild persistent asthma, uncomplicated 07/02/2020   History of pulmonary embolism 06/04/2020   Encounter for initial prescription of contraceptive pills 06/04/2020   Encounter for initial prescription of contraceptives 06/04/2020   Irregular bleeding 11/01/2019   Encounter for initial prescription of vaginal ring hormonal contraceptive 11/01/2019   Menorrhagia with irregular cycle 06/15/2017   Pregnancy examination or test, negative result 06/15/2017   Screening examination for STD (sexually transmitted disease) 06/15/2017   DMDD  (disruptive mood dysregulation disorder) (HCC) 09/17/2016   MDD (major depressive disorder) 09/12/2016   Bipolar 1 disorder (HCC) 04/10/2016   Irritability and anger 04/10/2016   ODD (oppositional defiant disorder) 05/14/2014   Bipolar 1 disorder, depressed, moderate (HCC) 05/14/2014   Lives in group home 04/12/2014   Obesity 04/12/2014   Eczema 04/12/2014   Seasonal allergies 04/12/2014   Acanthosis nigricans 04/12/2014   Failed vision screen 04/12/2014    Past Surgical History:  Procedure Laterality Date   ETHMOIDECTOMY Right 03/31/2020   Procedure: RIGHT TOTAL ETHMOIDECTOMY AND FRONTAL RECESS exploration;  Surgeon: Newman Pies, MD;  Location: [facility];  Service: ENT;  Laterality: Right;   MAXILLARY ANTROSTOMY Bilateral 03/31/2020   Procedure: ENDOSCOPIC MAXILLARY ANTROSTOMY WITH TISSUE REMOVAL;  Surgeon: Newman Pies, MD;  Location: [facility];  Service: ENT;  Laterality: Bilateral;   SINUS ENDO WITH FUSION Bilateral 03/31/2020   Procedure: SINUS ENDOSCOPY WITH FUSION NAVIGATION;  Surgeon: Newman Pies, MD;  Location: [facility];  Service: ENT;  Laterality: Bilateral;   TONSILLECTOMY AND ADENOIDECTOMY N/A 08/02/2016   Procedure: TONSILLECTOMY AND ADENOIDECTOMY;  Surgeon: Newman Pies, MD;  Location: [facility];  Service: ENT;  Laterality: N/A;   TURBINATE REDUCTION Bilateral 03/31/2020   Procedure: TURBINATE REDUCTION;  Surgeon: Newman Pies, MD;  Location:  SURGERY CENTER;  Service: ENT;  Laterality: Bilateral;   WISDOM TOOTH EXTRACTION      OB History     Gravida  0   Para  0   Term  0   Preterm  0   AB  0   Living  0      SAB  0   IAB  0   Ectopic  0   Multiple  0   Live Births  0            Home Medications    Prior to Admission medications   Medication Sig Start Date End Date Taking? Authorizing Provider  albuterol (PROVENTIL) (2.5 MG/3ML) 0.083% nebulizer solution SMARTSIG:1 Vial(s) Via Nebulizer  Every 4-6 Hours PRN 01/17/19  Yes [provider]  amoxicillin-clavulanate (AUGMENTIN) 875-125 MG tablet Take 1 tablet by mouth 2 (two) times daily. 07/15/20  Yes Waldon Merl, PA-C  azelastine (ASTELIN) 0.1 % nasal spray 2 sprays per nostril 1-2 times daily as needed. 07/02/20  Yes Alfonse Spruce, MD  fexofenadine (ALLEGRA) 180 MG tablet Take 180 mg by mouth daily.   Yes [provider]  ondansetron (ZOFRAN-ODT) 4 MG disintegrating tablet Take 4 mg by mouth every 8 (eight) hours. 06/17/20  Yes [provider]  predniSONE (DELTASONE) 20 MG tablet Take 1 tablet (20 mg total) by mouth daily with breakfast for 5 days. 07/21/20 07/26/20 Yes Bing Neighbors, FNP  pseudoephedrine (SUDAFED) 120 MG 12 hr tablet Take 1 tablet (120 mg total) by mouth 2 (two) times daily for 7 days. 07/21/20 07/28/20 Yes Bing Neighbors, FNP  RESTASIS 0.05 % ophthalmic emulsion 1 drop 2 (two) times daily. 06/07/19  Yes [provider]  SYMBICORT 80-4.5 MCG/ACT inhaler Inhale 1 puff into the lungs daily. 02/26/19  Yes [provider]  Dwyane Luo 200-62.5-25 MCG/INH AEPB Inhale 1 puff into the lungs daily. 07/02/20  Yes Alfonse Spruce, MD  XARELTO 20 MG TABS tablet Take 20 mg by mouth daily. 05/10/20  Yes [provider]  fluticasone (FLONASE) 50 MCG/ACT nasal spray Place 1 spray into both nostrils daily. 09/15/19   Dahlia Byes A, NP  norethindrone (MICRONOR) 0.35 MG tablet Take 1 tablet (0.35 mg total) by mouth daily. Patient not taking: Reported on 07/02/2020 06/04/20   Adline Potter, NP  triamcinolone (NASACORT) 55 MCG/ACT AERO nasal inhaler Place 2 sprays into the nose daily. 07/02/20   Alfonse Spruce, MD    Family History Family History  Problem Relation Age of Onset   Hypertension Mother    Cancer Mother        cervical   Diabetes Mother    Other Maternal Grandmother        pre diabetic   Other Paternal Grandmother        brain tumor     Social History Social History   Tobacco Use   Smoking status: Former    Years: 1.00    Pack years: 0.00    Types: Cigarettes   Smokeless tobacco: Never  Vaping Use   Vaping Use: Never used  Substance Use Topics   Alcohol use: Yes    Comment: socially   Drug use: Not Currently    Types: Marijuana    Comment: occassionally     Allergies   Adhesive [tape] and Latex   Review of Systems Review of Systems Pertinent negatives listed in HPI   Physical Exam Triage Vital Signs ED Triage Vitals  Enc Vitals Group     BP 07/21/20 0919 123/87     Pulse Rate 07/21/20 0919 90     Resp 07/21/20 0919 20     Temp 07/21/20 0919 98.4 F (36.9 C)     Temp Source 07/21/20 0919 Oral  SpO2 07/21/20 0919 97 %     Weight --      Height --      Head Circumference --      Peak Flow --      Pain Score 07/21/20 0913 6     Pain Loc --      Pain Edu? --      Excl. in GC? --    No data found.  Updated Vital Signs BP 123/87 (BP Location: Right Wrist)   Pulse 90   Temp 98.4 F (36.9 C) (Oral)   Resp 20   LMP 07/20/2020 (Exact Date)   SpO2 97%   Visual Acuity Right Eye Distance:   Left Eye Distance:   Bilateral Distance:    Right Eye Near:   Left Eye Near:    Bilateral Near:     Physical Exam  General Appearance:    Alert, cooperative, no distress  HENT:   Normocephalic, ears with MEE w/o erythema or swelling, nares mucosal edema with congestion, rhinorrhea, oropharynx    Eyes:    PERRL, conjunctiva/corneas clear, EOM's intact       Lungs:     Clear to auscultation bilaterally, respirations unlabored  Heart:    Regular rate and rhythm  Neurologic:   Awake, alert, oriented x 3. No apparent focal neurological           defect.      UC Treatments / Results  Labs (all labs ordered are listed, but only abnormal results are displayed) Labs Reviewed  NOVEL CORONAVIRUS, NAA    EKG   Radiology No results found.  Procedures Procedures (including critical care  time)  Medications Ordered in UC Medications - No data to display  Initial Impression / Assessment and Plan / UC Course  I have reviewed the triage vital signs and the nursing notes.  Pertinent labs & imaging results that were available during my care of the patient were reviewed by me and considered in my medical decision making (see chart for details).    COVID test pending.  Treatment of moderate asthma continue inhalers however will add prednisone 20 mg once daily with breakfast to help improve bronchial inflammation and sinus pressure and congestion.  Recommend DC Mucinex start pseudoephedrine for ear and nasal congestion.  Continue Robitussin for cough.  Follow-up with PCP as needed. Final Clinical Impressions(s) / UC Diagnoses   Final diagnoses:  Encounter for screening for COVID-19  Moderate asthma with acute exacerbation, unspecified whether persistent  Chronic rhinitis  Otalgia, left ear   Discharge Instructions   None    ED Prescriptions     Medication Sig Dispense Auth. Provider   predniSONE (DELTASONE) 20 MG tablet Take 1 tablet (20 mg total) by mouth daily with breakfast for 5 days. 5 tablet Bing Neighbors, FNP   pseudoephedrine (SUDAFED) 120 MG 12 hr tablet Take 1 tablet (120 mg total) by mouth 2 (two) times daily for 7 days. 14 tablet Bing Neighbors, FNP      PDMP not reviewed this encounter.   Bing Neighbors, FNP 07/21/20 1015

## 2020-07-21 NOTE — ED Triage Notes (Signed)
Pt presents with nasal congestion, L ear pain, laryngitis.  Started abx 5 days ago for sinus infection and sinus HA has improved but other s/s continued.  2 days left on abx.  Feels burning in chest and cough productive in morning for yellow mucous then dry cough throughout day.  Taking Mucinex, Robitussin, cough medicine.

## 2020-07-22 ENCOUNTER — Encounter: Payer: Self-pay | Admitting: Adult Health

## 2020-07-22 ENCOUNTER — Other Ambulatory Visit (HOSPITAL_COMMUNITY)
Admission: RE | Admit: 2020-07-22 | Discharge: 2020-07-22 | Disposition: A | Discharge status: Home / Self Care | Payer: 59 | Source: Ambulatory Visit | Attending: Adult Health | Admitting: Adult Health

## 2020-07-22 ENCOUNTER — Ambulatory Visit (INDEPENDENT_AMBULATORY_CARE_PROVIDER_SITE_OTHER): Payer: 59 | Admitting: Adult Health

## 2020-07-22 VITALS — BP 137/86 | HR 94 | Ht 68.0 in | Wt 294.0 lb

## 2020-07-22 DIAGNOSIS — Z113 Encounter for screening for infections with a predominantly sexual mode of transmission: Secondary | ICD-10-CM | POA: Diagnosis not present

## 2020-07-22 DIAGNOSIS — Z01419 Encounter for gynecological examination (general) (routine) without abnormal findings: Secondary | ICD-10-CM

## 2020-07-22 DIAGNOSIS — Z1159 Encounter for screening for other viral diseases: Secondary | ICD-10-CM

## 2020-07-22 HISTORY — DX: Encounter for gynecological examination (general) (routine) without abnormal findings: Z01.419

## 2020-07-22 LAB — SARS-COV-2, NAA 2 DAY TAT

## 2020-07-22 LAB — NOVEL CORONAVIRUS, NAA: SARS-CoV-2, NAA: DETECTED — AB

## 2020-07-22 NOTE — Progress Notes (Signed)
Patient ID: [name], female   DOB: [birth year], 20 y.o.   MRN: [id number] History of Present Illness:  Katie Price is a 20 year old black female,single, G0P0, in for well woman gyn exam and pap and requests STD testing. She was started on Micronor in May but did not like it so she stopped it. History of PE. PCP is Dr Margo Aye.   Current Medications, Allergies, Past Medical History, Past Surgical History, Family History and Social History were reviewed in [name].     Review of Systems: Patient denies any headaches, hearing loss, fatigue, blurred vision, shortness of breath, chest pain, abdominal pain, problems with bowel movements, urination, or intercourse. (Not active at present).No joint pain or mood swings.     Physical Exam:BP 137/86 (BP Location: Right Arm, Patient Position: Sitting, Cuff Size: Normal)   Pulse 94   Ht 5\' 8"  (1.727 m)   Wt 294 lb (133.4 kg)   LMP 07/20/2020 (Exact Date)   BMI 44.70 kg/m   General:  Well developed, well nourished, no acute distress Skin:  Warm and dry Neck:  Midline trachea, normal thyroid, good ROM, no lymphadenopathy Lungs; Clear to auscultation bilaterally Breast:  No dominant palpable mass, retraction, or nipple discharge Cardiovascular: Regular rate and rhythm Abdomen:  Soft, non tender, no hepatosplenomegaly Pelvic:  External genitalia is normal in appearance, no lesions.  The vagina is normal in appearance,+period blood. Urethra has no lesions or masses. The cervix is smooth, pap with GC/CHL performed. Uterus is felt to be normal size, shape, and contour.  No adnexal masses or tenderness noted.Bladder is non tender, no masses felt. Extremities/musculoskeletal:  No swelling or varicosities noted, no clubbing or cyanosis Psych:  No mood changes, alert and cooperative,seems happy AA is 0 Fall risk is low Depression screen [facility] 2/9 07/22/2020 11/01/2019 06/15/2017  Decreased Interest 0 0 0  Down, Depressed, Hopeless 0  0 0  PHQ - 2 Score 0 0 0  Altered sleeping 0 - -  Tired, decreased energy 0 - -  Change in appetite 0 - -  Feeling bad or failure about yourself  0 - -  Trouble concentrating 0 - -  Moving slowly or fidgety/restless 0 - -  Suicidal thoughts 0 - -  PHQ-9 Score 0 - -    GAD 7 : Generalized Anxiety Score 07/22/2020  Nervous, Anxious, on Edge 0  Control/stop worrying 0  Worry too much - different things 0  Trouble relaxing 0  Restless 0  Easily annoyed or irritable 0  Afraid - awful might happen 0  Total GAD 7 Score 0    Upstream - 07/22/20 1149       Pregnancy Intention Screening   Does the patient want to become pregnant in the next year? No    Does the patient's partner want to become pregnant in the next year? No    Would the patient like to discuss contraceptive options today? No      Contraception Wrap Up   Current Method Female Condom    End Method Female Condom    Contraception Counseling Provided No              Examination chaperoned by 07/24/20 LPN  Impression and Plan: 1. Encounter for gynecological examination with Papanicolaou smear of cervix Pap sent Physical in 1 year Pap in 3 if normal   2. Screening examination for STD (sexually transmitted disease) Check HIV/RPR   3. Need for hepatitis C screening  test Check hepatitis C antibody

## 2020-07-23 LAB — HEPATITIS C ANTIBODY: Hep C Virus Ab: 0.1 s/co ratio (ref 0.0–0.9)

## 2020-07-23 LAB — RPR: RPR Ser Ql: NONREACTIVE

## 2020-07-23 LAB — HIV ANTIBODY (ROUTINE TESTING W REFLEX): HIV Screen 4th Generation wRfx: NONREACTIVE

## 2020-07-25 LAB — CYTOLOGY - PAP
Chlamydia: NEGATIVE
Comment: NEGATIVE
Comment: NORMAL
Diagnosis: NEGATIVE
Neisseria Gonorrhea: NEGATIVE

## 2020-07-31 ENCOUNTER — Other Ambulatory Visit: Payer: Self-pay

## 2020-07-31 ENCOUNTER — Emergency Department (HOSPITAL_COMMUNITY)
Admission: EM | Admit: 2020-07-31 | Discharge: 2020-07-31 | Disposition: A | Discharge status: Home / Self Care | Payer: 59 | Attending: Emergency Medicine | Admitting: Emergency Medicine

## 2020-07-31 ENCOUNTER — Encounter (HOSPITAL_COMMUNITY): Payer: Self-pay | Admitting: Emergency Medicine

## 2020-07-31 DIAGNOSIS — K625 Hemorrhage of anus and rectum: Secondary | ICD-10-CM | POA: Diagnosis not present

## 2020-07-31 DIAGNOSIS — E876 Hypokalemia: Secondary | ICD-10-CM | POA: Insufficient documentation

## 2020-07-31 DIAGNOSIS — R42 Dizziness and giddiness: Secondary | ICD-10-CM | POA: Insufficient documentation

## 2020-07-31 DIAGNOSIS — Z87891 Personal history of nicotine dependence: Secondary | ICD-10-CM | POA: Insufficient documentation

## 2020-07-31 DIAGNOSIS — Z7951 Long term (current) use of inhaled steroids: Secondary | ICD-10-CM | POA: Diagnosis not present

## 2020-07-31 DIAGNOSIS — R1012 Left upper quadrant pain: Secondary | ICD-10-CM | POA: Diagnosis not present

## 2020-07-31 DIAGNOSIS — Z7901 Long term (current) use of anticoagulants: Secondary | ICD-10-CM | POA: Diagnosis not present

## 2020-07-31 DIAGNOSIS — J4530 Mild persistent asthma, uncomplicated: Secondary | ICD-10-CM | POA: Insufficient documentation

## 2020-07-31 DIAGNOSIS — Z91040 Latex allergy status: Secondary | ICD-10-CM | POA: Insufficient documentation

## 2020-07-31 LAB — COMPREHENSIVE METABOLIC PANEL
ALT: 28 U/L (ref 0–44)
AST: 18 U/L (ref 15–41)
Albumin: 3.8 g/dL (ref 3.5–5.0)
Alkaline Phosphatase: 55 U/L (ref 38–126)
Anion gap: 8 (ref 5–15)
BUN: 12 mg/dL (ref 6–20)
CO2: 23 mmol/L (ref 22–32)
Calcium: 8.9 mg/dL (ref 8.9–10.3)
Chloride: 104 mmol/L (ref 98–111)
Creatinine, Ser: 0.52 mg/dL (ref 0.44–1.00)
GFR, Estimated: >60 mL/min (ref >60)
Glucose, Bld: 92 mg/dL (ref 70–99)
Potassium: 3.2 mmol/L — ABNORMAL LOW (ref 3.5–5.1)
Sodium: 135 mmol/L (ref 135–145)
Total Bilirubin: 0.7 mg/dL (ref 0.3–1.2)
Total Protein: 7.2 g/dL (ref 6.5–8.1)

## 2020-07-31 LAB — CBC WITH DIFFERENTIAL/PLATELET
Abs Immature Granulocytes: 0.02 10*3/uL (ref 0.00–0.07)
Basophils Absolute: 0 10*3/uL (ref 0.0–0.1)
Basophils Relative: 0 %
Eosinophils Absolute: 0.1 10*3/uL (ref 0.0–0.5)
Eosinophils Relative: 2 %
HCT: 39.7 % (ref 36.0–46.0)
Hemoglobin: 12.4 g/dL (ref 12.0–15.0)
Immature Granulocytes: 0 %
Lymphocytes Relative: 37 %
Lymphs Abs: 2 10*3/uL (ref 0.7–4.0)
MCH: 27.8 pg (ref 26.0–34.0)
MCHC: 31.2 g/dL (ref 30.0–36.0)
MCV: 89 fL (ref 80.0–100.0)
Monocytes Absolute: 0.3 10*3/uL (ref 0.1–1.0)
Monocytes Relative: 6 %
Neutro Abs: 2.9 10*3/uL (ref 1.7–7.7)
Neutrophils Relative %: 55 %
Platelets: 216 10*3/uL (ref 150–400)
RBC: 4.46 MIL/uL (ref 3.87–5.11)
RDW: 14.6 % (ref 11.5–15.5)
WBC: 5.3 10*3/uL (ref 4.0–10.5)
nRBC: 0 % (ref 0.0–0.2)

## 2020-07-31 LAB — POC OCCULT BLOOD, ED: Fecal Occult Bld: NEGATIVE

## 2020-07-31 MED ORDER — OMEPRAZOLE 20 MG PO CPDR: 20.0000 mg | DELAYED_RELEASE_CAPSULE | Freq: Every day | ORAL | 0 refills | Status: DC

## 2020-07-31 MED ORDER — DICYCLOMINE HCL 10 MG PO CAPS
20.0000 mg | ORAL_CAPSULE | Freq: Once | ORAL | Status: DC
  Filled 2020-07-31: qty 2

## 2020-07-31 NOTE — ED Triage Notes (Signed)
Pt c/o bright red blood in stools since 07/28/20 with abd pain and dizziness

## 2020-07-31 NOTE — ED Provider Notes (Signed)
[facility] EMERGENCY DEPARTMENT Provider Note   CSN: [id number] Arrival date & time: 07/31/20  0901     History Chief Complaint  Patient presents with   Rectal Bleeding    Katie Price is a 20 y.o. female.  HPI  Patient with significant medical history and asthma, bipolar, PE currently on anticoagulant presents with chief complaint of rectal bleeding.  Patient states this started approximately 2 days ago, she states she sees blood on the tissue paper as well as the stool, states it is not a large a amount, she denies ever having this in the past, has no history of GI bleeds, no history of diverticulitis, hemorrhoids, denies associated constipation, straining with bowel movements, she endorses that she feels slightly lightheaded when she changes position, she denies  chest pain,  shortness of breath or feeling fatigued.  She does endorse that she is having some slight left-sided abdominal pain, all started 2 days ago, she has no associated nausea, vomiting,  she does admit that she drank a lot of alcohol over the weekend some alcohol and thinks this might be the cause of her discomfort.  She has no other complaints at this time, no significant abdominal history.  Patient does not endorse fevers, chills, chest pain or shortness of breath, no pedal edema.  Past Medical History:  Diagnosis Date   Asthma    Bipolar disorder (HCC)    PE (pulmonary thromboembolism) (HCC)    Tonsillar and adenoid hypertrophy 07/2016   snores during sleep, denies apnea    Patient Active Problem List   Diagnosis Date Noted   Encounter for gynecological examination with Papanicolaou smear of cervix 07/22/2020   Polypoid sinus degeneration 07/02/2020   Anaphylactic shock due to adverse food reaction 07/02/2020   Seasonal and perennial allergic rhinitis 07/02/2020   Mild persistent asthma, uncomplicated 07/02/2020   History of pulmonary embolism 06/04/2020   Encounter for initial prescription of  contraceptive pills 06/04/2020   Encounter for initial prescription of contraceptives 06/04/2020   Irregular bleeding 11/01/2019   Encounter for initial prescription of vaginal ring hormonal contraceptive 11/01/2019   Menorrhagia with irregular cycle 06/15/2017   Pregnancy examination or test, negative result 06/15/2017   Screening examination for STD (sexually transmitted disease) 06/15/2017   DMDD (disruptive mood dysregulation disorder) (HCC) 09/17/2016   MDD (major depressive disorder) 09/12/2016   Bipolar 1 disorder (HCC) 04/10/2016   Irritability and anger 04/10/2016   ODD (oppositional defiant disorder) 05/14/2014   Bipolar 1 disorder, depressed, moderate (HCC) 05/14/2014   Lives in group home 04/12/2014   Obesity 04/12/2014   Eczema 04/12/2014   Seasonal allergies 04/12/2014   Acanthosis nigricans 04/12/2014   Failed vision screen 04/12/2014    Past Surgical History:  Procedure Laterality Date   ETHMOIDECTOMY Right 03/31/2020   Procedure: RIGHT TOTAL ETHMOIDECTOMY AND FRONTAL RECESS exploration;  Surgeon: Newman Pies, MD;  Location: [facility];  Service: ENT;  Laterality: Right;   MAXILLARY ANTROSTOMY Bilateral 03/31/2020   Procedure: ENDOSCOPIC MAXILLARY ANTROSTOMY WITH TISSUE REMOVAL;  Surgeon: Newman Pies, MD;  Location: [facility];  Service: ENT;  Laterality: Bilateral;   SINUS ENDO WITH FUSION Bilateral 03/31/2020   Procedure: SINUS ENDOSCOPY WITH FUSION NAVIGATION;  Surgeon: Newman Pies, MD;  Location: [facility];  Service: ENT;  Laterality: Bilateral;   TONSILLECTOMY AND ADENOIDECTOMY N/A 08/02/2016   Procedure: TONSILLECTOMY AND ADENOIDECTOMY;  Surgeon: Newman Pies, MD;  Location: [facility];  Service: ENT;  Laterality:  N/A;   TURBINATE REDUCTION Bilateral 03/31/2020   Procedure: TURBINATE REDUCTION;  Surgeon: Newman Pies, MD;  Location: [facility];  Service: ENT;  Laterality: Bilateral;   WISDOM TOOTH EXTRACTION        OB History     Gravida  0   Para  0   Term  0   Preterm  0   AB  0   Living  0      SAB  0   IAB  0   Ectopic  0   Multiple  0   Live Births  0           Family History  Problem Relation Age of Onset   Hypertension Mother    Cancer Mother        cervical   Diabetes Mother    Other Maternal Grandmother        pre diabetic   Other Paternal Grandmother        brain tumor    Social History   Tobacco Use   Smoking status: Former    Years: 1.00    Pack years: 0.00    Types: Cigarettes   Smokeless tobacco: Never  Vaping Use   Vaping Use: Never used  Substance Use Topics   Alcohol use: Yes    Comment: socially   Drug use: Not Currently    Types: Marijuana    Comment: occassionally    Home Medications Prior to Admission medications   Medication Sig Start Date End Date Taking? Authorizing Provider  albuterol (PROVENTIL) (2.5 MG/3ML) 0.083% nebulizer solution Take 2.5 mg by nebulization every 4 (four) hours as needed for wheezing or shortness of breath. 01/17/19  Yes [provider]  amoxicillin-clavulanate (AUGMENTIN) 875-125 MG tablet Take 1 tablet by mouth 2 (two) times daily. 07/15/20  Yes Waldon Merl, PA-C  fexofenadine (ALLEGRA) 180 MG tablet Take 180 mg by mouth daily.   Yes [provider]  omeprazole (PRILOSEC) 20 MG capsule Take 1 capsule (20 mg total) by mouth daily. 07/31/20 08/30/20 Yes Carroll Sage, PA-C  ondansetron (ZOFRAN-ODT) 4 MG disintegrating tablet Take 4 mg by mouth every 8 (eight) hours as needed for nausea or vomiting. 06/17/20  Yes [provider]  RESTASIS 0.05 % ophthalmic emulsion Place 1 drop into both eyes 2 (two) times daily as needed (dry eye). 06/07/19  Yes [provider]  SYMBICORT 80-4.5 MCG/ACT inhaler Inhale 1 puff into the lungs daily. 02/26/19  Yes [provider]  XARELTO 20 MG TABS tablet Take 20 mg by mouth daily. 05/10/20  Yes [provider]   azelastine (ASTELIN) 0.1 % nasal spray 2 sprays per nostril 1-2 times daily as needed. Patient not taking: Reported on 07/31/2020 07/02/20   Alfonse Spruce, MD  fluticasone [facility]) 50 MCG/ACT nasal spray Place 1 spray into both nostrils daily. Patient not taking: No sig reported 09/15/19   Dahlia Byes A, NP  norethindrone (MICRONOR) 0.35 MG tablet Take 1 tablet (0.35 mg total) by mouth daily. Patient not taking: No sig reported 06/04/20   Adline Potter, NP  TRELEGY ELLIPTA 200-62.5-25 MCG/INH AEPB Inhale 1 puff into the lungs daily. Patient not taking: No sig reported 07/02/20   Alfonse Spruce, MD  triamcinolone (NASACORT) 55 MCG/ACT AERO nasal inhaler Place 2 sprays into the nose daily. Patient not taking: No sig reported 07/02/20   Alfonse Spruce, MD    Allergies    Adhesive [tape] and Latex  Review of Systems   Review of Systems  Constitutional:  Negative for chills and fever.  HENT:  Negative for congestion.   Respiratory:  Negative for shortness of breath.   Cardiovascular:  Negative for chest pain.  Gastrointestinal:  Positive for abdominal pain and blood in stool. Negative for nausea and vomiting.  Genitourinary:  Negative for enuresis, pelvic pain and vaginal bleeding.  Musculoskeletal:  Negative for back pain.  Skin:  Negative for rash.  Neurological:  Positive for dizziness and light-headedness.  Hematological:  Does not bruise/bleed easily.   Physical Exam Updated Vital Signs BP 122/84 (BP Location: Right Arm)   Pulse 86   Temp 98.5 F (36.9 C)   Resp 20   Ht 5\' 8"  (1.727 m)   Wt 133.4 kg   LMP 07/20/2020 (Exact Date)   SpO2 100%   BMI 44.70 kg/m   Physical Exam Vitals and nursing note reviewed. Exam conducted with a chaperone present.  Constitutional:      General: She is not in acute distress.    Appearance: She is not ill-appearing.  HENT:     Head: Normocephalic and atraumatic.     Nose: No congestion.  Eyes:      Conjunctiva/sclera: Conjunctivae normal.  Cardiovascular:     Rate and Rhythm: Normal rate and regular rhythm.     Pulses: Normal pulses.     Heart sounds: No murmur heard.   No friction rub. No gallop.  Pulmonary:     Effort: No respiratory distress.     Breath sounds: No wheezing, rhonchi or rales.  Abdominal:     General: There is no distension.     Palpations: Abdomen is soft.     Tenderness: There is abdominal tenderness. There is no right CVA tenderness or left CVA tenderness.     Comments: Abdomen was visualized is nondistended, normative bowel sounds, dull to percussion, she has slight tenderness to palpation in her left upper lower abdomen, there is no guarding, rebound tenderness, peritoneal sign, no CVA tenderness.  Genitourinary:    Rectum: Guaiac result negative.     Comments: With chaperone present rectal exam was performed, there is a noted external hemorrhoid, nonbleeding thrombosis, there is no other gross abnormalities, there is no palpable defects within the rectum, no melena, hematochezia, or other gross abnormalities noted. Skin:    General: Skin is warm and dry.  Neurological:     Mental Status: She is alert.  Psychiatric:        Mood and Affect: Mood normal.    ED Results / Procedures / Treatments   Labs (all labs ordered are listed, but only abnormal results are displayed) Labs Reviewed  COMPREHENSIVE METABOLIC PANEL - Abnormal; Notable for the following components:      Result Value   Potassium 3.2 (*)    All other components within normal limits  CBC WITH DIFFERENTIAL/PLATELET  PREGNANCY, URINE  POC OCCULT BLOOD, ED    EKG None  Radiology No results found.  Procedures Procedures   Medications Ordered in ED Medications  dicyclomine (BENTYL) capsule 20 mg (20 mg Oral Patient Refused/Not Given 07/31/20 1210)    ED Course  I have reviewed the triage vital signs and the nursing notes.  Pertinent labs & imaging results that were available  during my care of the patient were reviewed by me and considered in my medical decision making (see chart for details).    MDM Rules/Calculators/A&P  Initial impression-patient presents with left-sided abdominal pain and rectal bleeding.  She is alert, does not appear in acute stress, vital signs reassuring.  Will obtain basic lab work-up and reassess.  Work-up-CBC unremarkable, CMP shows slight hypokalemia 3.2, Hemoccult negative, urine pregnancy negative  Rule out-I have low suspicion for GI bleed as Hemoccult is negative, there is no peritoneal sign present my exam, hemoglobin is within normal limits.  Low suspicion for complicated diverticulitis as patient has no history of this, white count within normal limits, hemoglobin within normal limits, there is no peritoneal sign rebound tenderness on my exam.  Low suspicion for UTI, kidney stone, Pilo as patient denies any urinary symptoms, has no CVA tenderness.  Low suspicion for bowel obstruction as patient states she still passing flatus, having bowel movements, abdomen is nondistended.    Plan-  Rectal bleeding-since resolved suspect possible from external hemorrhoids, will recommend stool softeners, follow-up with PCP as needed. Patient does endorse some left-sided stomach pain she describes as a burning sensation after she has bowel movements or eats, I suspect this is more acid reflux will recommend PPIs however follow-up with PCP as needed.   Vital signs have remained stable, no indication for hospital admission.  Patient given at home care as well strict return precautions.  Patient verbalized that they understood agreed to said plan.  Final Clinical Impression(s) / ED Diagnoses Final diagnoses:  Rectal bleeding  Left upper quadrant abdominal pain    Rx / DC Orders ED Discharge Orders          Ordered    omeprazole (PRILOSEC) 20 MG capsule  Daily        07/31/20 1209             Barnie Del 07/31/20 1211    Pollyann Savoy, MD 07/31/20 1246

## 2020-07-31 NOTE — Discharge Instructions (Addendum)
Lab work looks reassuring, have started you on an acid pill please take as prescribed as I suspect this is what causing you to have some stomach pain.  Also like you to start taking MiraLAX 1 capful daily for the next 10 days I suspect you have a hemorrhoid causing to have some bleeding.  Please stay hydrated this medication only works as long as you are hydrated.  Please remember to eat foods that are high in fiber like vegetables, nuts, whole grains and remember to exercise.  Please follow-up your PCP as needed.  Come back to the emergency department if you develop chest pain, shortness of breath, severe abdominal pain, uncontrolled nausea, vomiting, diarrhea.

## 2020-08-24 ENCOUNTER — Ambulatory Visit
Admission: EM | Admit: 2020-08-24 | Discharge: 2020-08-24 | Disposition: A | Discharge status: Home / Self Care | Payer: 59 | Attending: Emergency Medicine | Admitting: Emergency Medicine

## 2020-08-24 ENCOUNTER — Encounter: Payer: Self-pay | Admitting: Emergency Medicine

## 2020-08-24 DIAGNOSIS — N898 Other specified noninflammatory disorders of vagina: Secondary | ICD-10-CM | POA: Insufficient documentation

## 2020-08-24 DIAGNOSIS — R300 Dysuria: Secondary | ICD-10-CM | POA: Insufficient documentation

## 2020-08-24 LAB — POCT URINALYSIS DIP (MANUAL ENTRY)
Bilirubin, UA: NEGATIVE
Blood, UA: NEGATIVE
Glucose, UA: NEGATIVE mg/dL
Ketones, POC UA: NEGATIVE mg/dL
Leukocytes, UA: NEGATIVE
Nitrite, UA: NEGATIVE
Protein Ur, POC: NEGATIVE mg/dL
Spec Grav, UA: 1.02 (ref 1.010–1.025)
Urobilinogen, UA: 0.2 E.U./dL
pH, UA: 7 (ref 5.0–8.0)

## 2020-08-24 MED ORDER — FLUCONAZOLE 150 MG PO TABS: 150.0000 mg | ORAL_TABLET | Freq: Once | ORAL | 0 refills | Status: AC

## 2020-08-24 NOTE — Discharge Instructions (Addendum)
Vaginal self-swab obtained.  We will follow up with you regarding abnormal results Prescribed diflucan 150 mg once daily and then second dose 72 hours later Take medications as prescribed and to completion If tests results are positive, please abstain from sexual activity until you and your partner(s) have been treated Follow up with PCP or [facility] if symptoms persists Return here or go to ER if you have any new or worsening symptoms fever, chills, nausea, vomiting, abdominal or pelvic pain, painful intercourse, vaginal discharge, vaginal bleeding, persistent symptoms despite treatment, etc... 

## 2020-08-24 NOTE — ED Provider Notes (Signed)
[facility]   [id number] 08/24/20 Arrival Time: 1546   SF:[id number] DISCHARGE  SUBJECTIVE:  Katie Price is a 20 y.o. female who presents to the urgent care for complaint of white vaginal discharge that is itchy and burning with urination for the past 2 days.  She denies a precipitating event, recent sexual encounter or recent antibiotic use.  Patient is sexually active with 1 female partners.  Describes discharge as thick white..  She has tried OTC medications without t relief.  Denies any alleviating or aggravating factors.  She reports similar symptoms in the past and was diagnosed with BV and treated accordingly.  She denies fever, chills, nausea, vomiting, abdominal or pelvic pain, urinary symptoms, vaginal itching,  dyspareunia, vaginal rashes or lesions.   No LMP recorded. Current birth control method: Compliant with BC:  ROS: As per HPI.  All other pertinent ROS negative.     Past Medical History:  Diagnosis Date   Asthma    Bipolar disorder (HCC)    PE (pulmonary thromboembolism) (HCC)    Tonsillar and adenoid hypertrophy 07/2016   snores during sleep, denies apnea   Past Surgical History:  Procedure Laterality Date   ETHMOIDECTOMY Right 03/31/2020   Procedure: RIGHT TOTAL ETHMOIDECTOMY AND FRONTAL RECESS exploration;  Surgeon: Newman Pies, MD;  Location: [facility];  Service: ENT;  Laterality: Right;   MAXILLARY ANTROSTOMY Bilateral 03/31/2020   Procedure: ENDOSCOPIC MAXILLARY ANTROSTOMY WITH TISSUE REMOVAL;  Surgeon: Newman Pies, MD;  Location: [facility];  Service: ENT;  Laterality: Bilateral;   SINUS ENDO WITH FUSION Bilateral 03/31/2020   Procedure: SINUS ENDOSCOPY WITH FUSION NAVIGATION;  Surgeon: Newman Pies, MD;  Location: [facility];  Service: ENT;  Laterality: Bilateral;   TONSILLECTOMY AND ADENOIDECTOMY N/A 08/02/2016   Procedure: TONSILLECTOMY AND ADENOIDECTOMY;  Surgeon: Newman Pies, MD;  Location: [facility];   Service: ENT;  Laterality: N/A;   TURBINATE REDUCTION Bilateral 03/31/2020   Procedure: TURBINATE REDUCTION;  Surgeon: Newman Pies, MD;  Location: [facility];  Service: ENT;  Laterality: Bilateral;   WISDOM TOOTH EXTRACTION     Allergies  Allergen Reactions   Adhesive [Tape] Rash   Latex Itching   No current facility-administered medications on file prior to encounter.   Current Outpatient Medications on File Prior to Encounter  Medication Sig Dispense Refill   albuterol (PROVENTIL) (2.5 MG/3ML) 0.083% nebulizer solution Take 2.5 mg by nebulization every 4 (four) hours as needed for wheezing or shortness of breath.     amoxicillin-clavulanate (AUGMENTIN) 875-125 MG tablet Take 1 tablet by mouth 2 (two) times daily. 14 tablet 0   azelastine (ASTELIN) 0.1 % nasal spray 2 sprays per nostril 1-2 times daily as needed. (Patient not taking: Reported on 07/31/2020) 30 mL 1   fexofenadine (ALLEGRA) 180 MG tablet Take 180 mg by mouth daily.     fluticasone (FLONASE) 50 MCG/ACT nasal spray Place 1 spray into both nostrils daily. (Patient not taking: No sig reported) 16 g 2   norethindrone (MICRONOR) 0.35 MG tablet Take 1 tablet (0.35 mg total) by mouth daily. (Patient not taking: No sig reported) 28 tablet 11   omeprazole (PRILOSEC) 20 MG capsule Take 1 capsule (20 mg total) by mouth daily. 30 capsule 0   ondansetron (ZOFRAN-ODT) 4 MG disintegrating tablet Take 4 mg by mouth every 8 (eight) hours as needed for nausea or vomiting.     RESTASIS 0.05 % ophthalmic emulsion Place 1 drop into both  eyes 2 (two) times daily as needed (dry eye).     SYMBICORT 80-4.5 MCG/ACT inhaler Inhale 1 puff into the lungs daily.     TRELEGY ELLIPTA 200-62.5-25 MCG/INH AEPB Inhale 1 puff into the lungs daily. (Patient not taking: No sig reported) 28 each 5   triamcinolone (NASACORT) 55 MCG/ACT AERO nasal inhaler Place 2 sprays into the nose daily. (Patient not taking: No sig reported) 1 each 5   XARELTO 20 MG TABS  tablet Take 20 mg by mouth daily.      Social History   Socioeconomic History   Marital status: Single    Spouse name: Not on file   Number of children: Not on file   Years of education: Not on file   Highest education level: Not on file  Occupational History   Not on file  Tobacco Use   Smoking status: Former    Years: 1.00    Types: Cigarettes   Smokeless tobacco: Never  Vaping Use   Vaping Use: Never used  Substance and Sexual Activity   Alcohol use: Yes    Comment: socially   Drug use: Not Currently    Types: Marijuana    Comment: occassionally   Sexual activity: Not Currently    Birth control/protection: Condom  Other Topics Concern   Not on file  Social History Narrative   Not on file   Social Determinants of Health   Financial Resource Strain: Low Risk    Difficulty of Paying Living Expenses: Not very hard  Food Insecurity: No Food Insecurity   Worried About Programme researcher, broadcasting/film/video in the Last Year: Never true   Ran Out of Food in the Last Year: Never true  Transportation Needs: No Transportation Needs   Lack of Transportation (Medical): No   Lack of Transportation (Non-Medical): No  Physical Activity: Insufficiently Active   Days of Exercise per Week: 4 days   Minutes of Exercise per Session: 30 min  Stress: No Stress Concern Present   Feeling of Stress : Not at all  Social Connections: Moderately Isolated   Frequency of Communication with Friends and Family: More than three times a week   Frequency of Social Gatherings with Friends and Family: Three times a week   Attends Religious Services: Never   Active Member of Clubs or Organizations: Yes   Attends Banker Meetings: 1 to 4 times per year   Marital Status: Never married  Catering manager Violence: Not At Risk   Fear of Current or Ex-Partner: No   Emotionally Abused: No   Physically Abused: No   Sexually Abused: No   Family History  Problem Relation Age of Onset   Hypertension Mother     Cancer Mother        cervical   Diabetes Mother    Other Maternal Grandmother        pre diabetic   Other Paternal Grandmother        brain tumor    OBJECTIVE:  Vitals:   08/24/20 1632  BP: (!) 143/91  Pulse: 87  Resp: 18  Temp: 98 F (36.7 C)  TempSrc: Oral  SpO2: 98%     General appearance: Alert, NAD, appears stated age Head: NCAT Throat: lips, mucosa, and tongue normal; teeth and gums normal Lungs: CTA bilaterally without adventitious breath sounds Heart: regular rate and rhythm.  Radial pulses 2+ symmetrical bilaterally Back: no CVA tenderness Abdomen: soft, non-tender; bowel sounds normal; no masses or organomegaly; no guarding or  rebound tenderness GU: Deferred, cervical swab obtained Skin: warm and dry Psychological:  Alert and cooperative. Normal mood and affect.  LABS:  Results for orders placed or performed during the hospital encounter of 08/24/20  POCT urinalysis dipstick  Result Value Ref Range   Color, UA yellow yellow   Clarity, UA clear clear   Glucose, UA negative negative mg/dL   Bilirubin, UA negative negative   Ketones, POC UA negative negative mg/dL   Spec Grav, UA 7.673 4.193 - 1.025   Blood, UA negative negative   pH, UA 7.0 5.0 - 8.0   Protein Ur, POC negative negative mg/dL   Urobilinogen, UA 0.2 0.2 or 1.0 E.U./dL   Nitrite, UA Negative Negative   Leukocytes, UA Negative Negative    Labs Reviewed  URINE CULTURE  POCT URINALYSIS DIP (MANUAL ENTRY)  CERVICOVAGINAL ANCILLARY ONLY    ASSESSMENT & PLAN:  1. Dysuria   2. Vaginal discharge     Meds ordered this encounter  Medications   fluconazole (DIFLUCAN) 150 MG tablet    Sig: Take 1 tablet (150 mg total) by mouth once for 1 dose. Take the second dose 72 hours after the first if symptom does not resolve    Dispense:  2 tablet    Refill:  0     Pending: Labs Reviewed  URINE CULTURE  POCT URINALYSIS DIP (MANUAL ENTRY)  CERVICOVAGINAL ANCILLARY ONLY    Patient is  stable at discharge.  We will treat for possible vaginal yeast infection.  Will await cervical ancillary.  Discharge instructions  Vaginal self-swab obtained.  We will follow up with you regarding abnormal results Prescribed diflucan 150 mg once daily and then second dose 72 hours later Take medications as prescribed and to completion If tests results are positive, please abstain from sexual activity until you and your partner(s) have been treated Follow up with PCP or [facility] if symptoms persists Return here or go to ER if you have any new or worsening symptoms fever, chills, nausea, vomiting, abdominal or pelvic pain, painful intercourse, vaginal discharge, vaginal bleeding, persistent symptoms despite treatment, etc...  Reviewed expectations re: course of current medical issues. Questions answered. Outlined signs and symptoms indicating need for more acute intervention. Patient verbalized understanding. After Visit Summary given.        Durward Parcel, FNP 08/24/20 1700

## 2020-08-24 NOTE — ED Triage Notes (Signed)
White vaginal discharge that is itchy and burning with urination x 2 days.

## 2020-08-26 ENCOUNTER — Telehealth (HOSPITAL_COMMUNITY): Payer: Self-pay | Admitting: Emergency Medicine

## 2020-08-26 LAB — CERVICOVAGINAL ANCILLARY ONLY
Bacterial Vaginitis (gardnerella): POSITIVE — AB
Candida Glabrata: NEGATIVE
Candida Vaginitis: NEGATIVE
Chlamydia: NEGATIVE
Comment: NEGATIVE
Comment: NEGATIVE
Comment: NEGATIVE
Comment: NEGATIVE
Comment: NEGATIVE
Comment: NORMAL
Neisseria Gonorrhea: NEGATIVE
Trichomonas: NEGATIVE

## 2020-08-26 MED ORDER — METRONIDAZOLE 500 MG PO TABS: 500.0000 mg | ORAL_TABLET | Freq: Two times a day (BID) | ORAL | 0 refills | Status: DC

## 2020-08-27 LAB — URINE CULTURE: Culture: NO GROWTH

## 2020-09-04 DIAGNOSIS — I2699 Other pulmonary embolism without acute cor pulmonale: Secondary | ICD-10-CM | POA: Diagnosis not present

## 2020-09-10 ENCOUNTER — Ambulatory Visit: Payer: 59 | Admitting: Allergy & Immunology

## 2020-09-10 DIAGNOSIS — J309 Allergic rhinitis, unspecified: Secondary | ICD-10-CM

## 2020-09-29 ENCOUNTER — Other Ambulatory Visit: Payer: Self-pay

## 2020-09-29 ENCOUNTER — Ambulatory Visit
Admission: EM | Admit: 2020-09-29 | Discharge: 2020-09-29 | Disposition: A | Discharge status: Home / Self Care | Payer: 59 | Attending: Family Medicine | Admitting: Family Medicine

## 2020-09-29 ENCOUNTER — Encounter: Payer: Self-pay | Admitting: Emergency Medicine

## 2020-09-29 DIAGNOSIS — J45909 Unspecified asthma, uncomplicated: Secondary | ICD-10-CM

## 2020-09-29 DIAGNOSIS — J0140 Acute pansinusitis, unspecified: Secondary | ICD-10-CM

## 2020-09-29 MED ORDER — PREDNISONE 20 MG PO TABS: 40.0000 mg | ORAL_TABLET | Freq: Every day | ORAL | 0 refills | Status: DC

## 2020-09-29 MED ORDER — AMOXICILLIN-POT CLAVULANATE 875-125 MG PO TABS: 1.0000 | ORAL_TABLET | Freq: Two times a day (BID) | ORAL | 0 refills | Status: DC

## 2020-09-29 MED ORDER — FLUCONAZOLE 150 MG PO TABS: 150.0000 mg | ORAL_TABLET | Freq: Every day | ORAL | 0 refills | Status: DC

## 2020-09-29 MED ORDER — ALBUTEROL SULFATE (2.5 MG/3ML) 0.083% IN NEBU: 2.5000 mg | INHALATION_SOLUTION | RESPIRATORY_TRACT | 0 refills | Status: DC | PRN

## 2020-09-29 NOTE — ED Provider Notes (Signed)
[facility]    CSN: [id number] Arrival date & time: 09/29/20  0901      History   Chief Complaint No chief complaint on file.   HPI Katie Price is a 20 y.o. female.   HPI Patient presents today with nasal congestion and runny nose x2 weeks.  Medical history significant for asthma and recurrent seasonal allergies.  She has taken OTC  medication over the last two weeks without relief. She she endorses that her asthma symptoms have exacerbated due to the onset of nasal symptoms.  She is having a nonproductive cough.  She has not been febrile.  Denies any known exposure to COVID however is being tested today by her employer.  She also needs a refill of her albuterol nebulizer solution.  Denies any chest pain or acute shortness of breath. Past Medical History:  Diagnosis Date   Asthma    Bipolar disorder (HCC)    PE (pulmonary thromboembolism) (HCC)    Tonsillar and adenoid hypertrophy 07/2016   snores during sleep, denies apnea    Patient Active Problem List   Diagnosis Date Noted   Encounter for gynecological examination with Papanicolaou smear of cervix 07/22/2020   Polypoid sinus degeneration 07/02/2020   Anaphylactic shock due to adverse food reaction 07/02/2020   Seasonal and perennial allergic rhinitis 07/02/2020   Mild persistent asthma, uncomplicated 07/02/2020   History of pulmonary embolism 06/04/2020   Encounter for initial prescription of contraceptive pills 06/04/2020   Encounter for initial prescription of contraceptives 06/04/2020   Irregular bleeding 11/01/2019   Encounter for initial prescription of vaginal ring hormonal contraceptive 11/01/2019   Menorrhagia with irregular cycle 06/15/2017   Pregnancy examination or test, negative result 06/15/2017   Screening examination for STD (sexually transmitted disease) 06/15/2017   DMDD (disruptive mood dysregulation disorder) (HCC) 09/17/2016   MDD (major depressive disorder) 09/12/2016   Bipolar 1  disorder (HCC) 04/10/2016   Irritability and anger 04/10/2016   ODD (oppositional defiant disorder) 05/14/2014   Bipolar 1 disorder, depressed, moderate (HCC) 05/14/2014   Lives in group home 04/12/2014   Obesity 04/12/2014   Eczema 04/12/2014   Seasonal allergies 04/12/2014   Acanthosis nigricans 04/12/2014   Failed vision screen 04/12/2014    Past Surgical History:  Procedure Laterality Date   ETHMOIDECTOMY Right 03/31/2020   Procedure: RIGHT TOTAL ETHMOIDECTOMY AND FRONTAL RECESS exploration;  Surgeon: Newman Pies, MD;  Location: [facility];  Service: ENT;  Laterality: Right;   MAXILLARY ANTROSTOMY Bilateral 03/31/2020   Procedure: ENDOSCOPIC MAXILLARY ANTROSTOMY WITH TISSUE REMOVAL;  Surgeon: Newman Pies, MD;  Location: [facility];  Service: ENT;  Laterality: Bilateral;   SINUS ENDO WITH FUSION Bilateral 03/31/2020   Procedure: SINUS ENDOSCOPY WITH FUSION NAVIGATION;  Surgeon: Newman Pies, MD;  Location: [facility];  Service: ENT;  Laterality: Bilateral;   TONSILLECTOMY AND ADENOIDECTOMY N/A 08/02/2016   Procedure: TONSILLECTOMY AND ADENOIDECTOMY;  Surgeon: Newman Pies, MD;  Location: [facility];  Service: ENT;  Laterality: N/A;   TURBINATE REDUCTION Bilateral 03/31/2020   Procedure: TURBINATE REDUCTION;  Surgeon: Newman Pies, MD;  Location: [facility];  Service: ENT;  Laterality: Bilateral;   WISDOM TOOTH EXTRACTION      OB History     Gravida  0   Para  0   Term  0   Preterm  0   AB  0   Living  0      SAB  0  IAB  0   Ectopic  0   Multiple  0   Live Births  0            Home Medications    Prior to Admission medications   Medication Sig Start Date End Date Taking? Authorizing Provider  amoxicillin-clavulanate (AUGMENTIN) 875-125 MG tablet Take 1 tablet by mouth 2 (two) times daily. 09/29/20  Yes Bing NeighborsHarris, Shaketa Serafin S, FNP  fluconazole (DIFLUCAN) 150 MG tablet Take 1 tablet (150 mg total) by mouth  daily. 09/29/20  Yes Bing NeighborsHarris, Skyllar Notarianni S, FNP  predniSONE (DELTASONE) 20 MG tablet Take 2 tablets (40 mg total) by mouth daily with breakfast. 09/29/20  Yes Bing NeighborsHarris, Ziara Thelander S, FNP  albuterol (PROVENTIL) (2.5 MG/3ML) 0.083% nebulizer solution Take 3 mLs (2.5 mg total) by nebulization every 4 (four) hours as needed for wheezing or shortness of breath. 09/29/20   Bing NeighborsHarris, Kaleiah Kutzer S, FNP  azelastine (ASTELIN) 0.1 % nasal spray 2 sprays per nostril 1-2 times daily as needed. Patient not taking: Reported on 07/31/2020 07/02/20   Alfonse SpruceGallagher, Joel Louis, MD  fexofenadine (ALLEGRA) 180 MG tablet Take 180 mg by mouth daily.    [provider]  fluticasone (FLONASE) 50 MCG/ACT nasal spray Place 1 spray into both nostrils daily. Patient not taking: No sig reported 09/15/19   Dahlia ByesBast, Traci A, NP  norethindrone (MICRONOR) 0.35 MG tablet Take 1 tablet (0.35 mg total) by mouth daily. Patient not taking: No sig reported 06/04/20   Cyril MourningGriffin, Jennifer A, NP  omeprazole (PRILOSEC) 20 MG capsule Take 1 capsule (20 mg total) by mouth daily. 07/31/20 08/30/20  Carroll SageFaulkner, William J, PA-C  ondansetron (ZOFRAN-ODT) 4 MG disintegrating tablet Take 4 mg by mouth every 8 (eight) hours as needed for nausea or vomiting. 06/17/20   [provider]  RESTASIS 0.05 % ophthalmic emulsion Place 1 drop into both eyes 2 (two) times daily as needed (dry eye). 06/07/19   [provider]  SYMBICORT 80-4.5 MCG/ACT inhaler Inhale 1 puff into the lungs daily. 02/26/19   [provider]  Dwyane LuoRELEGY ELLIPTA 200-62.5-25 MCG/INH AEPB Inhale 1 puff into the lungs daily. Patient not taking: No sig reported 07/02/20   Alfonse SpruceGallagher, Joel Louis, MD  triamcinolone (NASACORT) 55 MCG/ACT AERO nasal inhaler Place 2 sprays into the nose daily. Patient not taking: No sig reported 07/02/20   Alfonse SpruceGallagher, Joel Louis, MD  XARELTO 20 MG TABS tablet Take 20 mg by mouth daily. 05/10/20   [provider]    Family History Family History  Problem  Relation Age of Onset   Hypertension Mother    Cancer Mother        cervical   Diabetes Mother    Other Maternal Grandmother        pre diabetic   Other Paternal Grandmother        brain tumor    Social History Social History   Tobacco Use   Smoking status: Former    Years: 1.00    Types: Cigarettes   Smokeless tobacco: Never  Vaping Use   Vaping Use: Never used  Substance Use Topics   Alcohol use: Yes    Comment: socially   Drug use: Not Currently    Types: Marijuana    Comment: occassionally     Allergies   Adhesive [tape] and Latex  Review of Systems Review of Systems Pertinent negatives listed in HPI  Physical Exam Triage Vital Signs ED Triage Vitals  Enc Vitals Group     BP 09/29/20 0922 126/88  Pulse Rate 09/29/20 0922 74     Resp 09/29/20 0922 16     Temp 09/29/20 0922 98.2 F (36.8 C)     Temp Source 09/29/20 0922 Oral     SpO2 09/29/20 0922 97 %     Weight --      Height --      Head Circumference --      Peak Flow --      Pain Score 09/29/20 0924 7     Pain Loc --      Pain Edu? --      Excl. in GC? --    No data found.  Updated Vital Signs BP 126/88 (BP Location: Right Arm)   Pulse 74   Temp 98.2 F (36.8 C) (Oral)   Resp 16   LMP 09/16/2020 (Exact Date)   SpO2 97%   Visual Acuity Right Eye Distance:   Left Eye Distance:   Bilateral Distance:    Right Eye Near:   Left Eye Near:    Bilateral Near:     Physical Exam Constitutional:      Appearance: Normal appearance.  HENT:     Head: Normocephalic.     Right Ear: Tympanic membrane normal.     Left Ear: Tympanic membrane normal.     Nose: Congestion and rhinorrhea present.     Comments: Mucosal edema     Mouth/Throat:     Mouth: Mucous membranes are moist.     Pharynx: No posterior oropharyngeal erythema.  Eyes:     Extraocular Movements: Extraocular movements intact.     Pupils: Pupils are equal, round, and reactive to light.  Cardiovascular:     Rate and  Rhythm: Normal rate and regular rhythm.  Pulmonary:     Effort: Pulmonary effort is normal.     Breath sounds: Rhonchi present. No wheezing.  Lymphadenopathy:     Cervical: No cervical adenopathy.  Skin:    General: Skin is warm.     Capillary Refill: Capillary refill takes less than 2 seconds.  Neurological:     General: No focal deficit present.     Mental Status: She is alert.  Psychiatric:        Mood and Affect: Mood normal.        Behavior: Behavior normal.        Thought Content: Thought content normal.        Judgment: Judgment normal.     UC Treatments / Results  Labs (all labs ordered are listed, but only abnormal results are displayed) Labs Reviewed - No data to display  EKG   Radiology No results found.  Procedures Procedures (including critical care time)  Medications Ordered in UC Medications - No data to display  Initial Impression / Assessment and Plan / UC Course  I have reviewed the triage vital signs and the nursing notes.  Pertinent labs & imaging results that were available during my care of the patient were reviewed by me and considered in my medical decision making (see chart for details).    Acute nonrecurrent pansinusitis with a moderate asthma exacerbation Treatment per discharge medication orders.  Strict ER precautions given if asthma symptoms worsen or do not respond to current regimen.  Keep follow-up COVID testing appointment with your employer.  RTC as needed. Final Clinical Impressions(s) / UC Diagnoses   Final diagnoses:  Acute non-recurrent pansinusitis  Moderate asthma without complication, unspecified whether persistent   Discharge Instructions   None  ED Prescriptions     Medication Sig Dispense Auth. Provider   predniSONE (DELTASONE) 20 MG tablet Take 2 tablets (40 mg total) by mouth daily with breakfast. 10 tablet Bing Neighbors, FNP   amoxicillin-clavulanate (AUGMENTIN) 875-125 MG tablet Take 1 tablet by mouth 2  (two) times daily. 20 tablet Bing Neighbors, FNP   fluconazole (DIFLUCAN) 150 MG tablet Take 1 tablet (150 mg total) by mouth daily. 2 tablet Bing Neighbors, FNP   albuterol (PROVENTIL) (2.5 MG/3ML) 0.083% nebulizer solution Take 3 mLs (2.5 mg total) by nebulization every 4 (four) hours as needed for wheezing or shortness of breath. 75 mL Bing Neighbors, FNP      PDMP not reviewed this encounter.   Bing Neighbors, FNP 09/29/20 1040

## 2020-09-29 NOTE — ED Triage Notes (Signed)
Nasal congestion and runny nose x 2 weeks. Productive cough with yellow and sometimes bloody mucus.

## 2020-10-07 ENCOUNTER — Telehealth: Payer: 59 | Admitting: Adult Health

## 2020-10-11 ENCOUNTER — Other Ambulatory Visit: Payer: Self-pay | Admitting: Family Medicine

## 2020-10-21 ENCOUNTER — Telehealth: Payer: Self-pay | Admitting: Women's Health

## 2020-10-21 ENCOUNTER — Other Ambulatory Visit: Payer: Self-pay

## 2020-10-21 DIAGNOSIS — Z3200 Encounter for pregnancy test, result unknown: Secondary | ICD-10-CM

## 2020-10-21 NOTE — Telephone Encounter (Signed)
Patient called stating that she has taken two pregnancy test that were negative but patient states she would like to have a blood pregnancy test be cause she is two weeks late and she knows she is pregnant. Please contact pt, she states she does not want an appointment just blood work.

## 2020-10-21 NOTE — Progress Notes (Signed)
Pt requested HCG for possible pregnancy. Confirmed with Cyril Mourning and order placed.

## 2020-10-22 ENCOUNTER — Other Ambulatory Visit: Payer: Self-pay | Admitting: Adult Health

## 2020-10-22 DIAGNOSIS — Z3201 Encounter for pregnancy test, result positive: Secondary | ICD-10-CM | POA: Diagnosis not present

## 2020-10-22 LAB — BETA HCG QUANT (REF LAB): hCG Quant: 14 m[IU]/mL

## 2020-10-22 NOTE — Progress Notes (Signed)
Check [facility] 9/29

## 2020-10-23 ENCOUNTER — Telehealth: Payer: Self-pay | Admitting: Adult Health

## 2020-10-23 ENCOUNTER — Other Ambulatory Visit: Payer: Self-pay | Admitting: Adult Health

## 2020-10-23 DIAGNOSIS — Z3201 Encounter for pregnancy test, result positive: Secondary | ICD-10-CM

## 2020-10-23 LAB — BETA HCG QUANT (REF LAB): hCG Quant: 7 m[IU]/mL

## 2020-10-23 NOTE — Telephone Encounter (Signed)
Pt saw her lab result note in MyChart & would like an explanation as to what a chemical pregnancy is  Please advise & notify pt

## 2020-10-23 NOTE — Telephone Encounter (Signed)
Mychart msg sent to pt.

## 2020-10-29 DIAGNOSIS — Z3201 Encounter for pregnancy test, result positive: Secondary | ICD-10-CM | POA: Diagnosis not present

## 2020-10-30 LAB — BETA HCG QUANT (REF LAB): hCG Quant: <1 m[IU]/mL

## 2020-11-10 ENCOUNTER — Ambulatory Visit: Payer: 59 | Admitting: Obstetrics & Gynecology

## 2020-11-10 ENCOUNTER — Ambulatory Visit (INDEPENDENT_AMBULATORY_CARE_PROVIDER_SITE_OTHER): Payer: 59 | Admitting: Adult Health

## 2020-11-10 ENCOUNTER — Other Ambulatory Visit (HOSPITAL_COMMUNITY)
Admission: RE | Admit: 2020-11-10 | Discharge: 2020-11-10 | Disposition: A | Discharge status: Home / Self Care | Payer: 59 | Source: Ambulatory Visit | Attending: Obstetrics & Gynecology | Admitting: Obstetrics & Gynecology

## 2020-11-10 ENCOUNTER — Encounter: Payer: Self-pay | Admitting: Adult Health

## 2020-11-10 ENCOUNTER — Other Ambulatory Visit: Payer: Self-pay

## 2020-11-10 VITALS — BP 134/89 | HR 94 | Ht 68.0 in | Wt 301.0 lb

## 2020-11-10 DIAGNOSIS — Z3009 Encounter for other general counseling and advice on contraception: Secondary | ICD-10-CM

## 2020-11-10 DIAGNOSIS — Z113 Encounter for screening for infections with a predominantly sexual mode of transmission: Secondary | ICD-10-CM | POA: Insufficient documentation

## 2020-11-10 DIAGNOSIS — Z86711 Personal history of pulmonary embolism: Secondary | ICD-10-CM | POA: Diagnosis not present

## 2020-11-10 DIAGNOSIS — Z3202 Encounter for pregnancy test, result negative: Secondary | ICD-10-CM

## 2020-11-10 HISTORY — DX: Encounter for other general counseling and advice on contraception: Z30.09

## 2020-11-10 LAB — POCT URINE PREGNANCY: Preg Test, Ur: NEGATIVE

## 2020-11-10 NOTE — Progress Notes (Signed)
  Subjective:     Patient ID: Katie Price, female   DOB: 02-13-2000, 20 y.o.   MRN: [id number]  HPI Katie Price is a 20 year old black female single, G1P0010, in for STD testing and talk birth control. She had PE. PCP is Dr Margo Aye. Lab Results  Component Value Date   DIAGPAP  07/22/2020    - Negative for intraepithelial lesion or malignancy (NILM)    Review of Systems Patient denies any headaches, hearing loss, fatigue, blurred vision, shortness of breath, chest pain, abdominal pain, problems with bowel movements, urination, or intercourse. No joint pain or mood swings.  Reviewed past medical,surgical, social and family history. Reviewed medications and allergies.     Objective:   Physical Exam BP 134/89 (BP Location: Left Arm, Patient Position: Sitting, Cuff Size: Large)   Pulse 94   Ht 5\' 8"  (1.727 m)   Wt (!) 301 lb (136.5 kg)   LMP 10/25/2020   BMI 45.77 kg/m     UPT is negative  Skin warm and dry.Pelvic: external genitalia is normal in appearance no lesions, vagina: white discharge without odor,urethra has no lesions or masses noted, cervix:smooth, uterus: normal size, shape and contour, non tender, no masses felt, adnexa: no masses or tenderness noted. Bladder is non tender and no masses felt. CV swab obtained. Fall risk is low  Upstream - 11/10/20 1134       Pregnancy Intention Screening   Does the patient want to become pregnant in the next year? No    Does the patient's partner want to become pregnant in the next year? No    Would the patient like to discuss contraceptive options today? Yes      Contraception Wrap Up   Current Method Female Condom    End Method Hormonal Implant    Contraception Counseling Provided Yes            Examination chaperoned by 11/12/20 young LPN  Assessment:     1. Pregnancy examination or test, negative result  - POCT urine pregnancy  2. Screening examination for STD (sexually transmitted disease) CV swab sent for GC/CHL. Trich,BV and  yeast - Cervicovaginal ancillary only( [place]) - HIV Antibody (routine testing w rflx) - RPR  3. General counseling and advice on contraceptive management Discussed POP,Depo,Nexplanon and IUD and she wants nexplanon  4. History of pulmonary embolism     Plan:     No sex and return 11/19/20 for nexplanon insertion Review handout on nexplanon

## 2020-11-11 LAB — CERVICOVAGINAL ANCILLARY ONLY
Bacterial Vaginitis (gardnerella): NEGATIVE
Candida Glabrata: NEGATIVE
Candida Vaginitis: NEGATIVE
Chlamydia: NEGATIVE
Comment: NEGATIVE
Comment: NEGATIVE
Comment: NEGATIVE
Comment: NEGATIVE
Comment: NEGATIVE
Comment: NORMAL
Neisseria Gonorrhea: NEGATIVE
Trichomonas: NEGATIVE

## 2020-11-11 LAB — RPR: RPR Ser Ql: NONREACTIVE

## 2020-11-11 LAB — HIV ANTIBODY (ROUTINE TESTING W REFLEX): HIV Screen 4th Generation wRfx: NONREACTIVE

## 2020-11-19 ENCOUNTER — Encounter: Payer: 59 | Admitting: Adult Health

## 2020-11-25 ENCOUNTER — Encounter: Payer: 59 | Admitting: Adult Health

## 2020-11-28 DIAGNOSIS — G4700 Insomnia, unspecified: Secondary | ICD-10-CM | POA: Diagnosis not present

## 2021-01-12 ENCOUNTER — Other Ambulatory Visit: Payer: Self-pay | Admitting: Adult Health

## 2021-01-12 MED ORDER — NORETHINDRONE 0.35 MG PO TABS: 1.0000 | ORAL_TABLET | Freq: Every day | ORAL | 11 refills | Status: DC

## 2021-01-12 NOTE — Progress Notes (Signed)
Will rx micronor 

## 2021-01-15 ENCOUNTER — Encounter: Payer: Self-pay | Admitting: Emergency Medicine

## 2021-01-15 ENCOUNTER — Ambulatory Visit
Admission: EM | Admit: 2021-01-15 | Discharge: 2021-01-15 | Disposition: A | Discharge status: Home / Self Care | Payer: 59 | Attending: Family Medicine | Admitting: Family Medicine

## 2021-01-15 ENCOUNTER — Other Ambulatory Visit: Payer: Self-pay

## 2021-01-15 DIAGNOSIS — J3089 Other allergic rhinitis: Secondary | ICD-10-CM

## 2021-01-15 DIAGNOSIS — J0100 Acute maxillary sinusitis, unspecified: Secondary | ICD-10-CM | POA: Diagnosis not present

## 2021-01-15 DIAGNOSIS — J4521 Mild intermittent asthma with (acute) exacerbation: Secondary | ICD-10-CM

## 2021-01-15 MED ORDER — METRONIDAZOLE 500 MG PO TABS: 500.0000 mg | ORAL_TABLET | Freq: Two times a day (BID) | ORAL | 0 refills | Status: DC

## 2021-01-15 MED ORDER — PREDNISONE 20 MG PO TABS: 40.0000 mg | ORAL_TABLET | Freq: Every day | ORAL | 0 refills | Status: DC

## 2021-01-15 MED ORDER — AMOXICILLIN-POT CLAVULANATE 875-125 MG PO TABS: 1.0000 | ORAL_TABLET | Freq: Two times a day (BID) | ORAL | 0 refills | Status: DC

## 2021-01-15 NOTE — ED Provider Notes (Signed)
[facility]    CSN: [id number] Arrival date & time: 01/15/21  0801      History   Chief Complaint Chief Complaint  Patient presents with   Sore Throat   Nasal Congestion    HPI Katie Price is a 20 y.o. female.   Patient presenting today with 1 week history of progressively worsening nasal congestion, facial pain and pressure, sore throat, mild cough.  Denies fever, chills, body aches, chest pain, shortness of breath.  So far trying doubling up on her allergy regimen, albuterol as needed for her asthma symptoms, DayQuil and NyQuil.  No known sick contacts recently.  Requesting COVID and flu testing prior to the holidays.   Past Medical History:  Diagnosis Date   Asthma    Bipolar disorder (HCC)    PE (pulmonary thromboembolism) (HCC)    Tonsillar and adenoid hypertrophy 07/2016   snores during sleep, denies apnea    Patient Active Problem List   Diagnosis Date Noted   General counseling and advice on contraceptive management 11/10/2020   Encounter for gynecological examination with Papanicolaou smear of cervix 07/22/2020   Polypoid sinus degeneration 07/02/2020   Anaphylactic shock due to adverse food reaction 07/02/2020   Seasonal and perennial allergic rhinitis 07/02/2020   Mild persistent asthma, uncomplicated 07/02/2020   History of pulmonary embolism 06/04/2020   Encounter for initial prescription of contraceptive pills 06/04/2020   Encounter for initial prescription of contraceptives 06/04/2020   Irregular bleeding 11/01/2019   Encounter for initial prescription of vaginal ring hormonal contraceptive 11/01/2019   Menorrhagia with irregular cycle 06/15/2017   Pregnancy examination or test, negative result 06/15/2017   Screening examination for STD (sexually transmitted disease) 06/15/2017   DMDD (disruptive mood dysregulation disorder) (HCC) 09/17/2016   MDD (major depressive disorder) 09/12/2016   Bipolar 1 disorder (HCC) 04/10/2016    Irritability and anger 04/10/2016   ODD (oppositional defiant disorder) 05/14/2014   Bipolar 1 disorder, depressed, moderate (HCC) 05/14/2014   Lives in group home 04/12/2014   Obesity 04/12/2014   Eczema 04/12/2014   Seasonal allergies 04/12/2014   Acanthosis nigricans 04/12/2014   Failed vision screen 04/12/2014    Past Surgical History:  Procedure Laterality Date   ETHMOIDECTOMY Right 03/31/2020   Procedure: RIGHT TOTAL ETHMOIDECTOMY AND FRONTAL RECESS exploration;  Surgeon: Newman Pies, MD;  Location: [facility];  Service: ENT;  Laterality: Right;   MAXILLARY ANTROSTOMY Bilateral 03/31/2020   Procedure: ENDOSCOPIC MAXILLARY ANTROSTOMY WITH TISSUE REMOVAL;  Surgeon: Newman Pies, MD;  Location: [facility];  Service: ENT;  Laterality: Bilateral;   SINUS ENDO WITH FUSION Bilateral 03/31/2020   Procedure: SINUS ENDOSCOPY WITH FUSION NAVIGATION;  Surgeon: Newman Pies, MD;  Location: [facility];  Service: ENT;  Laterality: Bilateral;   TONSILLECTOMY AND ADENOIDECTOMY N/A 08/02/2016   Procedure: TONSILLECTOMY AND ADENOIDECTOMY;  Surgeon: Newman Pies, MD;  Location: [facility];  Service: ENT;  Laterality: N/A;   TURBINATE REDUCTION Bilateral 03/31/2020   Procedure: TURBINATE REDUCTION;  Surgeon: Newman Pies, MD;  Location: [facility];  Service: ENT;  Laterality: Bilateral;   WISDOM TOOTH EXTRACTION      OB History     Gravida  1   Para      Term      Preterm      AB  1   Living         SAB      IAB  Ectopic      Multiple      Live Births               Home Medications    Prior to Admission medications   Medication Sig Start Date End Date Taking? Authorizing Provider  amoxicillin-clavulanate (AUGMENTIN) 875-125 MG tablet Take 1 tablet by mouth every 12 (twelve) hours. 01/15/21  Yes Particia Nearing, PA-C  fexofenadine (ALLEGRA) 180 MG tablet Take 180 mg by mouth daily.   Yes [provider]   metroNIDAZOLE (FLAGYL) 500 MG tablet Take 1 tablet (500 mg total) by mouth 2 (two) times daily. 01/15/21  Yes Particia Nearing, PA-C  norethindrone (MICRONOR) 0.35 MG tablet Take 1 tablet (0.35 mg total) by mouth daily. Use condoms for 1 pack, start with next period and take at same time daily 01/12/21   Adline Potter, NP  predniSONE (DELTASONE) 20 MG tablet Take 2 tablets (40 mg total) by mouth daily with breakfast. 01/15/21  Yes Particia Nearing, PA-C  albuterol (PROVENTIL) (2.5 MG/3ML) 0.083% nebulizer solution Take 3 mLs (2.5 mg total) by nebulization every 4 (four) hours as needed for wheezing or shortness of breath. 09/29/20   Bing Neighbors, FNP  ondansetron (ZOFRAN-ODT) 4 MG disintegrating tablet Take 4 mg by mouth every 8 (eight) hours as needed for nausea or vomiting. 06/17/20   [provider]  SYMBICORT 80-4.5 MCG/ACT inhaler Inhale 1 puff into the lungs daily. 02/26/19   [provider]    Family History Family History  Problem Relation Age of Onset   Hypertension Mother    Cancer Mother        cervical   Diabetes Mother    Other Maternal Grandmother        pre diabetic   Other Paternal Grandmother        brain tumor    Social History Social History   Tobacco Use   Smoking status: Former    Years: 1.00    Types: Cigarettes   Smokeless tobacco: Never  Vaping Use   Vaping Use: Never used  Substance Use Topics   Alcohol use: Yes    Comment: socially   Drug use: Not Currently    Types: Marijuana    Comment: occassionally     Allergies   Adhesive [tape] and Latex   Review of Systems Review of Systems Per HPI  Physical Exam Triage Vital Signs ED Triage Vitals  Enc Vitals Group     BP 01/15/21 0831 116/88     Pulse Rate 01/15/21 0831 92     Resp 01/15/21 0831 16     Temp 01/15/21 0831 99.3 F (37.4 C)     Temp Source 01/15/21 0831 Oral     SpO2 01/15/21 0831 97 %     Weight --      Height --      Head  Circumference --      Peak Flow --      Pain Score 01/15/21 0829 6     Pain Loc --      Pain Edu? --      Excl. in GC? --    No data found.  Updated Vital Signs BP 116/88    Pulse 92    Temp 99.3 F (37.4 C) (Oral)    Resp 16    LMP 12/29/2020    SpO2 97%   Visual Acuity Right Eye Distance:   Left Eye Distance:   Bilateral Distance:  Right Eye Near:   Left Eye Near:    Bilateral Near:     Physical Exam Vitals and nursing note reviewed.  Constitutional:      Appearance: Normal appearance.  HENT:     Head: Atraumatic.     Right Ear: Tympanic membrane and external ear normal.     Left Ear: Tympanic membrane and external ear normal.     Nose: Congestion present.     Mouth/Throat:     Mouth: Mucous membranes are moist.     Pharynx: Posterior oropharyngeal erythema present.  Eyes:     Extraocular Movements: Extraocular movements intact.     Conjunctiva/sclera: Conjunctivae normal.  Cardiovascular:     Rate and Rhythm: Normal rate and regular rhythm.     Heart sounds: Normal heart sounds.  Pulmonary:     Effort: Pulmonary effort is normal.     Breath sounds: Wheezing present. No rales.     Comments: Mild scattered wheezes bilaterally Musculoskeletal:        General: Normal range of motion.     Cervical back: Normal range of motion and neck supple.  Skin:    General: Skin is warm and dry.  Neurological:     Mental Status: She is alert and oriented to person, place, and time.  Psychiatric:        Mood and Affect: Mood normal.        Thought Content: Thought content normal.     UC Treatments / Results  Labs (all labs ordered are listed, but only abnormal results are displayed) Labs Reviewed  COVID-19, FLU A+B NAA    EKG   Radiology No results found.  Procedures Procedures (including critical care time)  Medications Ordered in UC Medications - No data to display  Initial Impression / Assessment and Plan / UC Course  I have reviewed the triage vital  signs and the nursing notes.  Pertinent labs & imaging results that were available during my care of the patient were reviewed by me and considered in my medical decision making (see chart for details).     Vital signs overall reassuring today, will treat for maxillary sinusitis and asthma exacerbation with prednisone, Augmentin, and continued albuterol and allergy regimen.  Supportive home care and return precautions reviewed.  COVID and flu test pending per her request.  Final Clinical Impressions(s) / UC Diagnoses   Final diagnoses:  Acute maxillary sinusitis, recurrence not specified  Mild intermittent asthma with acute exacerbation  Seasonal allergic rhinitis due to other allergic trigger   Discharge Instructions   None    ED Prescriptions     Medication Sig Dispense Auth. Provider   amoxicillin-clavulanate (AUGMENTIN) 875-125 MG tablet Take 1 tablet by mouth every 12 (twelve) hours. 14 tablet Particia Nearing, [place]   predniSONE (DELTASONE) 20 MG tablet Take 2 tablets (40 mg total) by mouth daily with breakfast. 10 tablet Particia Nearing, PA-C   metroNIDAZOLE (FLAGYL) 500 MG tablet Take 1 tablet (500 mg total) by mouth 2 (two) times daily. 14 tablet Particia Nearing, [place]      PDMP not reviewed this encounter.   Particia Nearing, [place] 01/15/21 1024

## 2021-01-15 NOTE — ED Triage Notes (Signed)
PT reports congestion, facial pain, and sore throat for 1 week. Requests covid and flu testing.

## 2021-01-16 LAB — COVID-19, FLU A+B NAA
Influenza A, NAA: NOT DETECTED
Influenza B, NAA: NOT DETECTED
SARS-CoV-2, NAA: NOT DETECTED

## 2021-03-18 ENCOUNTER — Ambulatory Visit
Admission: EM | Admit: 2021-03-18 | Discharge: 2021-03-18 | Disposition: A | Discharge status: Home / Self Care | Payer: Medicaid Other | Attending: Family Medicine | Admitting: Family Medicine

## 2021-03-18 ENCOUNTER — Other Ambulatory Visit: Payer: Self-pay

## 2021-03-18 DIAGNOSIS — J0100 Acute maxillary sinusitis, unspecified: Secondary | ICD-10-CM

## 2021-03-18 DIAGNOSIS — J4521 Mild intermittent asthma with (acute) exacerbation: Secondary | ICD-10-CM

## 2021-03-18 MED ORDER — AMOXICILLIN-POT CLAVULANATE 875-125 MG PO TABS: 1.0000 | ORAL_TABLET | Freq: Two times a day (BID) | ORAL | 0 refills | Status: DC

## 2021-03-18 MED ORDER — FLUCONAZOLE 150 MG PO TABS: ORAL_TABLET | ORAL | 0 refills | Status: DC

## 2021-03-18 MED ORDER — DEXAMETHASONE SODIUM PHOSPHATE 10 MG/ML IJ SOLN
10.0000 mg | Freq: Once | INTRAMUSCULAR | Status: AC
  Administered 2021-03-18: 10 mg via INTRAMUSCULAR

## 2021-03-18 NOTE — ED Triage Notes (Signed)
Pt reports nasal congestion and drainage in throat x 3 days. Tylenol and Sudafed gives no relief.

## 2021-03-21 NOTE — ED Provider Notes (Signed)
[facility]   [id number] 03/18/21 Arrival Time: 1744  ASSESSMENT & PLAN:  1. Mild intermittent asthma with acute exacerbation   2. Acute non-recurrent maxillary sinusitis    No resp distress.  Begin: Meds ordered this encounter  Medications   amoxicillin-clavulanate (AUGMENTIN) 875-125 MG tablet    Sig: Take 1 tablet by mouth every 12 (twelve) hours.    Dispense:  20 tablet    Refill:  0   dexamethasone (DECADRON) injection 10 mg   fluconazole (DIFLUCAN) 150 MG tablet    Sig: Take one tablet by mouth as a single dose. May repeat in 3 days if symptoms persist.    Dispense:  2 tablet    Refill:  0   No indication for chest imaging at this time. Asthma precautions given. OTC symptom care as needed.  Recommend:  Follow-up Information     Benita Stabile, MD.   Specialty: Internal Medicine Why: As needed. Contact information: [address] [place] [facility] [address] [phone]                 Reviewed expectations re: course of current medical issues. Questions answered. Outlined signs and symptoms indicating need for more acute intervention. Patient verbalized understanding. After Visit Summary given.  SUBJECTIVE: History from: patient.  Katie Price is a 21 y.o. female who reports asthma exac with wheezing; approx 2-3 days. No current SOB. Also with max sinus pain over past two weeks following URI. Afebrile. Overall normal PO intake without n/v. Sick contacts: no. Ambulatory without difficulty. No LE edema. Typically her asthma is well controlled. No tx PTA.  Social History   Tobacco Use  Smoking Status Former   Years: 1.00   Types: Cigarettes  Smokeless Tobacco Never    OBJECTIVE:  Vitals:   03/18/21 1814  BP: 113/80  Pulse: 85  Resp: 18  Temp: 98.6 F (37 C)  TempSrc: Oral  SpO2: 95%     General appearance: alert; NAD HEENT: [place]; AT; with mild nasal congestion and maxillary sinus TTP Neck: supple without LAD Cv: RRR  without murmer Lungs: unlabored respirations, moderate bilateral expiratory wheezing; cough: mild, dry; no significant respiratory distress Skin: warm and dry Psychological: alert and cooperative; normal mood and affect   Allergies  Allergen Reactions   Adhesive [Tape] Rash   Latex Itching    Past Medical History:  Diagnosis Date   Asthma    Bipolar disorder (HCC)    PE (pulmonary thromboembolism) (HCC)    Tonsillar and adenoid hypertrophy 07/2016   snores during sleep, denies apnea   Family History  Problem Relation Age of Onset   Hypertension Mother    Cancer Mother        cervical   Diabetes Mother    Other Maternal Grandmother        pre diabetic   Other Paternal Grandmother        brain tumor   Social History   Socioeconomic History   Marital status: Single    Spouse name: Not on file   Number of children: Not on file   Years of education: Not on file   Highest education level: Not on file  Occupational History   Not on file  Tobacco Use   Smoking status: Former    Years: 1.00    Types: Cigarettes   Smokeless tobacco: Never  Vaping Use   Vaping Use: Never used  Substance and Sexual Activity   Alcohol use: Yes  Comment: socially   Drug use: Not Currently    Types: Marijuana    Comment: occassionally   Sexual activity: Yes    Birth control/protection: Condom  Other Topics Concern   Not on file  Social History Narrative   Not on file   Social Determinants of Health   Financial Resource Strain: Low Risk    Difficulty of Paying Living Expenses: Not very hard  Food Insecurity: No Food Insecurity   Worried About Programme researcher, broadcasting/film/video in the Last Year: Never true   Ran Out of Food in the Last Year: Never true  Transportation Needs: No Transportation Needs   Lack of Transportation (Medical): No   Lack of Transportation (Non-Medical): No  Physical Activity: Insufficiently Active   Days of Exercise per Week: 4 days   Minutes of Exercise per Session:  30 min  Stress: No Stress Concern Present   Feeling of Stress : Not at all  Social Connections: Moderately Isolated   Frequency of Communication with Friends and Family: More than three times a week   Frequency of Social Gatherings with Friends and Family: Three times a week   Attends Religious Services: Never   Active Member of Clubs or Organizations: Yes   Attends Banker Meetings: 1 to 4 times per year   Marital Status: Never married  Intimate Partner Violence: Not At Risk   Fear of Current or Ex-Partner: No   Emotionally Abused: No   Physically Abused: No   Sexually Abused: No             Mardella Layman, MD 03/21/21 [phone]

## 2021-04-16 ENCOUNTER — Encounter: Payer: Self-pay | Admitting: Advanced Practice Midwife

## 2021-04-16 ENCOUNTER — Ambulatory Visit (INDEPENDENT_AMBULATORY_CARE_PROVIDER_SITE_OTHER): Payer: Medicaid Other | Admitting: Advanced Practice Midwife

## 2021-04-16 ENCOUNTER — Other Ambulatory Visit (HOSPITAL_COMMUNITY)
Admission: RE | Admit: 2021-04-16 | Discharge: 2021-04-16 | Disposition: A | Discharge status: Home / Self Care | Payer: Medicaid Other | Source: Ambulatory Visit | Attending: Advanced Practice Midwife | Admitting: Advanced Practice Midwife

## 2021-04-16 ENCOUNTER — Other Ambulatory Visit: Payer: Self-pay

## 2021-04-16 VITALS — BP 118/76 | HR 100 | Ht 68.0 in | Wt 290.0 lb

## 2021-04-16 DIAGNOSIS — N921 Excessive and frequent menstruation with irregular cycle: Secondary | ICD-10-CM | POA: Insufficient documentation

## 2021-04-16 DIAGNOSIS — R102 Pelvic and perineal pain: Secondary | ICD-10-CM | POA: Insufficient documentation

## 2021-04-16 DIAGNOSIS — N761 Subacute and chronic vaginitis: Secondary | ICD-10-CM | POA: Diagnosis present

## 2021-04-16 MED ORDER — TRANEXAMIC ACID 650 MG PO TABS: 1300.0000 mg | ORAL_TABLET | Freq: Three times a day (TID) | ORAL | 11 refills | Status: DC

## 2021-04-16 NOTE — Patient Instructions (Signed)
Probiotics for the vagina:  VAGINAL:    OTC products such as Luvena, use 2-3 times a week   ORAL:   UP4 ADULT Superior, compatible and safe strains DDS  -1 L.acidophilus (super strain) with B. Longum, B.Bifidum & B.Infantis Acid-resistant-survives stomach acid and Bile salts   Fortified with prebiotic Fructooligosaccharide to enhance growth and performance Potency: 15 Billion CFU/capsule Dosage: 1 capsule daily, best before a meal.  Amazon:  $21.90 for 60 caps   iF THERE IS STILL A PROBLEM ADD,   FLORAJEN ACIDOPHILUS High Potency Acidophilus Florajen high potency acidophilus is especially effective for restoring and maintaining a healthy, comfortable balance of vaginal flora. It is also beneficial for overall intestinal health and maintaining the immune system. 20 Billion live cultures per capsule Available in 30 and 60 capsules   Amazon:  $19.99 for 60 caps  

## 2021-04-16 NOTE — Progress Notes (Signed)
[facility] Visit  ?Patient name: Katie Price MRN [id number]  Date of birth: 04-24-2000 ? ?CC & HPI:  ?Katie Price is a 21 y.o. African American female presenting today for cramps on right side for 2 months(even when not bleeding) and passing clots when on period for about the last 4 months (periods are much heavier, has been off [facility] for a year, doesn't want to go back on it). Has frequent BV, uses OTC boric acid about 2 weeks out of every month (odor and heavy discharge are her sx).  LMP 2 weeks ago. Also requests STD testing. ? ?Pertinent History Reviewed:  ?Medical & Surgical Hx:   ?Past Medical History:  ?Diagnosis Date  ? Asthma   ? Bipolar disorder (HCC)   ? PE (pulmonary thromboembolism) (HCC)   ? Tonsillar and adenoid hypertrophy 07/2016  ? snores during sleep, denies apnea  ? ?Past Surgical History:  ?Procedure Laterality Date  ? ETHMOIDECTOMY Right 03/31/2020  ? Procedure: RIGHT TOTAL ETHMOIDECTOMY AND FRONTAL RECESS exploration;  Surgeon: Newman Pies, MD;  Location: [facility];  Service: ENT;  Laterality: Right;  ? MAXILLARY ANTROSTOMY Bilateral 03/31/2020  ? Procedure: ENDOSCOPIC MAXILLARY ANTROSTOMY WITH TISSUE REMOVAL;  Surgeon: Newman Pies, MD;  Location: [facility];  Service: ENT;  Laterality: Bilateral;  ? SINUS ENDO WITH FUSION Bilateral 03/31/2020  ? Procedure: SINUS ENDOSCOPY WITH FUSION NAVIGATION;  Surgeon: Newman Pies, MD;  Location: [facility];  Service: ENT;  Laterality: Bilateral;  ? TONSILLECTOMY AND ADENOIDECTOMY N/A 08/02/2016  ? Procedure: TONSILLECTOMY AND ADENOIDECTOMY;  Surgeon: Newman Pies, MD;  Location: [facility];  Service: ENT;  Laterality: N/A;  ? TURBINATE REDUCTION Bilateral 03/31/2020  ? Procedure: TURBINATE REDUCTION;  Surgeon: Newman Pies, MD;  Location:  SUR[facility]ERY C[facility];  Service: ENT;  Laterality: Bilateral;  ? WISDOM TOOTH EXTRACTION    ? ?Family History  ?Problem Relation Age of Onset  ? Hypertension Mother   ?  Cancer Mother   ?     cervical  ? Diabetes Mother   ? Other Maternal Grandmother   ?     pre diabetic  ? Other Paternal Grandmother   ?     brain tumor  ? ? ?Current Outpatient Medications:  ?  albuterol (PROVENTIL) (2.5 MG/3ML) 0.083% nebulizer solution, Take 3 mLs (2.5 mg total) by nebulization every 4 (four) hours as needed for wheezing or shortness of breath., Disp: 75 mL, Rfl: 0 ?  amoxicillin-clavulanate (AUGMENTIN) 875-125 MG tablet, Take 1 tablet by mouth every 12 (twelve) hours., Disp: 20 tablet, Rfl: 0 ?  fexofenadine (ALLEGRA) 180 MG tablet, Take 180 mg by mouth daily., Disp: , Rfl:  ?  fluconazole (DIFLUCAN) 150 MG tablet, Take one tablet by mouth as a single dose. May repeat in 3 days if symptoms persist., Disp: 2 tablet, Rfl: 0 ?  SYMBICORT 80-4.5 MCG/ACT inhaler, Inhale 1 puff into the lungs daily., Disp: , Rfl:  ?Social History: Reviewed -  reports that she has quit smoking. Her smoking use included cigarettes. She has never used smokeless tobacco. ? ?Review of Systems:   ?Constitutional: Negative for fever and chills ?Eyes: Negative for visual disturbances ?Respiratory: Negative for shortness of breath, dyspnea ?Cardiovascular: Negative for chest pain or palpitations  ?Gastrointestinal: Negative for vomiting, diarrhea and constipation; no abdominal pain ?Genitourinary: Negative for dysuria and urgency, vaginal irritation or itching ?Musculoskeletal: Negative for back pain, joint pain, myalgias  ?Neurological: Negative for dizziness and headaches ? ? ? ?  Objective Findings:  ? ? ?Physical Examination: ?There were no vitals filed for this visit. ?General appearance - well appearing, and in no distress ?Mental status - alert, oriented to person, place, and time ?Chest:  Normal respiratory effort ?Heart - normal rate and regular rhythm ?Abdomen:  Soft, nontender ?Pelvic: SSE: small amount of DC w/slight amine odor.  ?Musculoskeletal:  Normal range of motion without pain ?Extremities:  No edema ? ? ? ?No  results found for this or any previous visit (from the past 24 hour(s)).  ? ? ?Assessment & Plan:  ?A:  ? Menorrhagia/dysmenorrhea ? Pelvic pain, right side ?P: ?  ?Orders Placed This Encounter  ?Procedures  ? Mycoplasma / ureaplasma culture  ?Lysteda w/periods ?Screen for CHL/GC  If negative, order pelvic US to assess pain on right side.  ?Vaginal probiotic for chronic odor/?chronic BV ?  ? No follow-ups on file. ? ?Jacklyn Shell CNM ?04/16/2021 ?2:37 PM ? ? ? ? ? ?

## 2021-04-16 NOTE — Addendum Note (Signed)
Addended by: Colen Darling on: 04/16/2021 03:26 PM ? ? Modules accepted: Orders ? ?

## 2021-04-17 ENCOUNTER — Encounter: Payer: Self-pay | Admitting: Advanced Practice Midwife

## 2021-04-17 ENCOUNTER — Telehealth: Payer: Self-pay

## 2021-04-17 NOTE — Telephone Encounter (Signed)
[facility] called stating that they received the mycoplasma swab, but since it was not in a viral transport container, they are unable to run the test. Provider sent msg. ?

## 2021-04-20 ENCOUNTER — Other Ambulatory Visit: Payer: Self-pay

## 2021-04-20 ENCOUNTER — Other Ambulatory Visit (INDEPENDENT_AMBULATORY_CARE_PROVIDER_SITE_OTHER): Payer: Medicaid Other

## 2021-04-20 DIAGNOSIS — N761 Subacute and chronic vaginitis: Secondary | ICD-10-CM

## 2021-04-20 LAB — CERVICOVAGINAL ANCILLARY ONLY
Bacterial Vaginitis (gardnerella): POSITIVE — AB
Candida Glabrata: NEGATIVE
Candida Vaginitis: NEGATIVE
Chlamydia: NEGATIVE
Comment: NEGATIVE
Comment: NEGATIVE
Comment: NEGATIVE
Comment: NEGATIVE
Comment: NEGATIVE
Comment: NORMAL
Neisseria Gonorrhea: NEGATIVE
Trichomonas: NEGATIVE

## 2021-04-20 NOTE — Progress Notes (Signed)
? ?  NURSE VISIT- Swab recollection per provider ? ?SUBJECTIVE:  ?Katie Price is a 21 y.o. G1P0010 GYN patientfemale here for a vaginal swab for recollection of mycoplasma/ureaplasma culture per Katie Price.  She was here on 04/16/21 and swab was collected for chronic vaginitis, however, it was collected in the wrong tube per lab. Katie Price sent message to patient requesting she come in for recollection. ? ?OBJECTIVE:  ?LMP 04/05/2021   ?Appears well, in no apparent distress ? ?ASSESSMENT: ?Vaginal swab for chronic vaginitis ? ?PLAN: ?Self-collected vaginal probe for chronic vaginitis sent to lab ?Treatment: to be determined once results are received ?Follow-up as needed if symptoms persist/worsen, or new symptoms develop ? ?Katie Price  ?04/20/2021 ?11:29 AM ? ?

## 2021-04-21 ENCOUNTER — Encounter: Payer: Self-pay | Admitting: Advanced Practice Midwife

## 2021-04-21 LAB — MYCOPLASMA / UREAPLASMA CULTURE

## 2021-04-21 LAB — SPECIMEN STATUS REPORT

## 2021-04-23 ENCOUNTER — Encounter: Payer: Self-pay | Admitting: Advanced Practice Midwife

## 2021-04-23 ENCOUNTER — Other Ambulatory Visit: Payer: Self-pay | Admitting: Advanced Practice Midwife

## 2021-04-23 MED ORDER — FLUCONAZOLE 150 MG PO TABS: ORAL_TABLET | ORAL | 2 refills | Status: DC

## 2021-04-23 MED ORDER — DOXYCYCLINE HYCLATE 100 MG PO CAPS: 100.0000 mg | ORAL_CAPSULE | Freq: Two times a day (BID) | ORAL | 0 refills | Status: DC

## 2021-04-23 NOTE — Progress Notes (Signed)
Doxycycline for ureaplasma.  ?

## 2021-04-27 LAB — MYCOPLASMA / UREAPLASMA CULTURE
Mycoplasma hominis Culture: POSITIVE — AB
Ureaplasma urealyticum: POSITIVE — AB

## 2021-05-13 ENCOUNTER — Other Ambulatory Visit: Payer: Self-pay

## 2021-05-13 ENCOUNTER — Ambulatory Visit: Payer: Medicaid Other

## 2021-05-13 ENCOUNTER — Emergency Department (HOSPITAL_COMMUNITY): Payer: Medicaid Other

## 2021-05-13 ENCOUNTER — Emergency Department (HOSPITAL_COMMUNITY)
Admission: EM | Admit: 2021-05-13 | Discharge: 2021-05-13 | Disposition: A | Discharge status: Home / Self Care | Payer: Medicaid Other | Attending: Emergency Medicine | Admitting: Emergency Medicine

## 2021-05-13 ENCOUNTER — Ambulatory Visit
Admission: EM | Admit: 2021-05-13 | Discharge: 2021-05-13 | Disposition: A | Discharge status: Home / Self Care | Payer: Medicaid Other | Attending: Family Medicine | Admitting: Family Medicine

## 2021-05-13 ENCOUNTER — Encounter (HOSPITAL_COMMUNITY): Payer: Self-pay

## 2021-05-13 ENCOUNTER — Encounter: Payer: Self-pay | Admitting: Emergency Medicine

## 2021-05-13 DIAGNOSIS — R059 Cough, unspecified: Secondary | ICD-10-CM | POA: Diagnosis not present

## 2021-05-13 DIAGNOSIS — R791 Abnormal coagulation profile: Secondary | ICD-10-CM | POA: Diagnosis not present

## 2021-05-13 DIAGNOSIS — Z91040 Latex allergy status: Secondary | ICD-10-CM | POA: Insufficient documentation

## 2021-05-13 DIAGNOSIS — R0789 Other chest pain: Secondary | ICD-10-CM

## 2021-05-13 DIAGNOSIS — R000 Tachycardia, unspecified: Secondary | ICD-10-CM

## 2021-05-13 DIAGNOSIS — R7989 Other specified abnormal findings of blood chemistry: Secondary | ICD-10-CM

## 2021-05-13 DIAGNOSIS — Z7951 Long term (current) use of inhaled steroids: Secondary | ICD-10-CM | POA: Diagnosis not present

## 2021-05-13 DIAGNOSIS — Z86711 Personal history of pulmonary embolism: Secondary | ICD-10-CM | POA: Diagnosis not present

## 2021-05-13 DIAGNOSIS — R079 Chest pain, unspecified: Secondary | ICD-10-CM

## 2021-05-13 DIAGNOSIS — R0781 Pleurodynia: Secondary | ICD-10-CM | POA: Diagnosis present

## 2021-05-13 DIAGNOSIS — J4530 Mild persistent asthma, uncomplicated: Secondary | ICD-10-CM | POA: Diagnosis not present

## 2021-05-13 DIAGNOSIS — R0602 Shortness of breath: Secondary | ICD-10-CM | POA: Insufficient documentation

## 2021-05-13 LAB — CBC WITH DIFFERENTIAL/PLATELET
Abs Immature Granulocytes: 0.01 10*3/uL (ref 0.00–0.07)
Basophils Absolute: 0 10*3/uL (ref 0.0–0.1)
Basophils Relative: 0 %
Eosinophils Absolute: 0.2 10*3/uL (ref 0.0–0.5)
Eosinophils Relative: 4 %
HCT: 40.2 % (ref 36.0–46.0)
Hemoglobin: 13.3 g/dL (ref 12.0–15.0)
Immature Granulocytes: 0 %
Lymphocytes Relative: 29 %
Lymphs Abs: 1.3 10*3/uL (ref 0.7–4.0)
MCH: 29 pg (ref 26.0–34.0)
MCHC: 33.1 g/dL (ref 30.0–36.0)
MCV: 87.8 fL (ref 80.0–100.0)
Monocytes Absolute: 0.3 10*3/uL (ref 0.1–1.0)
Monocytes Relative: 6 %
Neutro Abs: 2.8 10*3/uL (ref 1.7–7.7)
Neutrophils Relative %: 61 %
Platelets: 200 10*3/uL (ref 150–400)
RBC: 4.58 MIL/uL (ref 3.87–5.11)
RDW: 14.1 % (ref 11.5–15.5)
WBC: 4.6 10*3/uL (ref 4.0–10.5)
nRBC: 0 % (ref 0.0–0.2)

## 2021-05-13 LAB — TROPONIN I (HIGH SENSITIVITY)
Troponin I (High Sensitivity): 2 ng/L (ref <18)
Troponin I (High Sensitivity): <2 ng/L (ref <18)

## 2021-05-13 LAB — BASIC METABOLIC PANEL
Anion gap: 7 (ref 5–15)
BUN: 12 mg/dL (ref 6–20)
CO2: 23 mmol/L (ref 22–32)
Calcium: 9.4 mg/dL (ref 8.9–10.3)
Chloride: 109 mmol/L (ref 98–111)
Creatinine, Ser: 0.56 mg/dL (ref 0.44–1.00)
GFR, Estimated: >60 mL/min (ref >60)
Glucose, Bld: 98 mg/dL (ref 70–99)
Potassium: 3.8 mmol/L (ref 3.5–5.1)
Sodium: 139 mmol/L (ref 135–145)

## 2021-05-13 LAB — D-DIMER, QUANTITATIVE: D-Dimer, Quant: 0.63 ug/mL-FEU — ABNORMAL HIGH (ref 0.00–0.50)

## 2021-05-13 LAB — HCG, SERUM, QUALITATIVE: Preg, Serum: NEGATIVE

## 2021-05-13 MED ORDER — IOHEXOL 350 MG/ML SOLN
100.0000 mL | Freq: Once | INTRAVENOUS | Status: AC | PRN
  Administered 2021-05-13: 80 mL via INTRAVENOUS

## 2021-05-13 MED ORDER — LIDOCAINE 5 % EX PTCH
1.0000 | MEDICATED_PATCH | CUTANEOUS | Status: DC
  Administered 2021-05-13: 1 via TRANSDERMAL
  Filled 2021-05-13: qty 1

## 2021-05-13 NOTE — ED Provider Notes (Signed)
?[facility] ? ? ?[id number] ?05/13/21 Arrival Time: 1540 ? ?ASSESSMENT & PLAN: ? ?1. Chest pain, unspecified type   ?2. Tachycardia   ?3. History of pulmonary embolism   ? ?Cannot r/o recurrent PE here. ECG shows slight tachycardia without ischemic changes. ?Declines CXR here. ?To ED via private vehicle for further evaluation. Stable upon discharge. ? ?Reviewed expectations re: course of current medical issues. Questions answered. ?Outlined signs and symptoms indicating need for more acute intervention. ?Patient verbalized understanding. ?After Visit Summary given. ? ? ?SUBJECTIVE: ? ?History from: patient. ?DELONA CLASBY is a 21 y.o. female who presents with complaint of chest pain; abrupt onset within past 24-48 hours; worse with deep breaths. Mild SOB. With reported associated tachycardia "in the 170s" this am. History of PE on year ago with similar symptoms. ? ?Social History  ? ?Tobacco Use  ?Smoking Status Former  ? Years: 1.00  ? Types: Cigarettes  ?Smokeless Tobacco Never  ? ?Social History  ? ?Substance and Sexual Activity  ?Alcohol Use Yes  ? Comment: socially  ? ? ?OBJECTIVE: ? ?Vitals:  ? 05/13/21 1547  ?BP: 122/85  ?Pulse: (!) 110  ?Resp: 18  ?Temp: 98.6 ?F (37 ?C)  ?TempSrc: Oral  ?SpO2: 97%  ?Weight: 126.1 kg  ?Height: 5\' 9"  (1.753 m)  ?  ?General appearance: alert, oriented, no acute distress ?Eyes: PERRLA; EOMI; conjunctivae normal ?HENT: normocephalic; atraumatic ?Neck: supple with FROM ?Lungs: without labored respirations; speaks full sentences without difficulty; CTAB ?Heart: tachycardic ?Extremities: without edema; without calf swelling or tenderness; symmetrical without gross deformities ?Skin: warm and dry; without rash or lesions ?Neuro: normal gait ?Psychological: alert and cooperative; normal mood and affect ? ?Allergies  ?Allergen Reactions  ? Adhesive [Tape] Rash  ? Latex Itching  ? ? ?Past Medical History:  ?Diagnosis Date  ? Asthma   ? Bipolar disorder (HCC)   ? PE (pulmonary  thromboembolism) (HCC)   ? Tonsillar and adenoid hypertrophy 07/2016  ? snores during sleep, denies apnea  ? ?Social History  ? ?Socioeconomic History  ? Marital status: Single  ?  Spouse name: Not on file  ? Number of children: Not on file  ? Years of education: Not on file  ? Highest education level: Not on file  ?Occupational History  ? Not on file  ?Tobacco Use  ? Smoking status: Former  ?  Years: 1.00  ?  Types: Cigarettes  ? Smokeless tobacco: Never  ?Vaping Use  ? Vaping Use: Never used  ?Substance and Sexual Activity  ? Alcohol use: Yes  ?  Comment: socially  ? Drug use: Not Currently  ?  Types: Marijuana  ?  Comment: occassionally  ? Sexual activity: Yes  ?  Birth control/protection: Condom  ?Other Topics Concern  ? Not on file  ?Social History Narrative  ? Not on file  ? ?Social Determinants of Health  ? ?Financial Resource Strain: Low Risk   ? Difficulty of Paying Living Expenses: Not very hard  ?Food Insecurity: No Food Insecurity  ? Worried About 08/2016 in the Last Year: Never true  ? Ran Out of Food in the Last Year: Never true  ?Transportation Needs: No Transportation Needs  ? Lack of Transportation (Medical): No  ? Lack of Transportation (Non-Medical): No  ?Physical Activity: Insufficiently Active  ? Days of Exercise per Week: 4 days  ? Minutes of Exercise per Session: 30 min  ?Stress: No Stress Concern Present  ? Feeling of Stress : Not  at all  ?Social Connections: Moderately Isolated  ? Frequency of Communication with Friends and Family: More than three times a week  ? Frequency of Social Gatherings with Friends and Family: Three times a week  ? Attends Religious Services: Never  ? Active Member of Clubs or Organizations: Yes  ? Attends Banker Meetings: 1 to 4 times per year  ? Marital Status: Never married  ?Intimate Partner Violence: Not At Risk  ? Fear of Current or Ex-Partner: No  ? Emotionally Abused: No  ? Physically Abused: No  ? Sexually Abused: No  ? ?Family  History  ?Problem Relation Age of Onset  ? Hypertension Mother   ? Cancer Mother   ?     cervical  ? Diabetes Mother   ? Other Maternal Grandmother   ?     pre diabetic  ? Other Paternal Grandmother   ?     brain tumor  ? ?Past Surgical History:  ?Procedure Laterality Date  ? ETHMOIDECTOMY Right 03/31/2020  ? Procedure: RIGHT TOTAL ETHMOIDECTOMY AND FRONTAL RECESS exploration;  Surgeon: Newman Pies, MD;  Location: [facility];  Service: ENT;  Laterality: Right;  ? MAXILLARY ANTROSTOMY Bilateral 03/31/2020  ? Procedure: ENDOSCOPIC MAXILLARY ANTROSTOMY WITH TISSUE REMOVAL;  Surgeon: Newman Pies, MD;  Location: [facility];  Service: ENT;  Laterality: Bilateral;  ? SINUS ENDO WITH FUSION Bilateral 03/31/2020  ? Procedure: SINUS ENDOSCOPY WITH FUSION NAVIGATION;  Surgeon: Newman Pies, MD;  Location: [facility];  Service: ENT;  Laterality: Bilateral;  ? TONSILLECTOMY AND ADENOIDECTOMY N/A 08/02/2016  ? Procedure: TONSILLECTOMY AND ADENOIDECTOMY;  Surgeon: Newman Pies, MD;  Location: [facility];  Service: ENT;  Laterality: N/A;  ? TURBINATE REDUCTION Bilateral 03/31/2020  ? Procedure: TURBINATE REDUCTION;  Surgeon: Newman Pies, MD;  Location: [facility];  Service: ENT;  Laterality: Bilateral;  ? WISDOM TOOTH EXTRACTION    ? ? ?  ?Mardella Layman, MD ?05/13/21 1605 ? ?

## 2021-05-13 NOTE — ED Triage Notes (Addendum)
Pt reports chest pain x2 days and reports pain is worse with deep breath and reports elevated heart rate and BP today while at home. Pt reports history of PE. ?

## 2021-05-13 NOTE — Discharge Instructions (Addendum)
Please schedule a follow-up appointment with your primary care doctor to discuss your ED visit and symptoms today. ?If your symptoms change, worsen, or you have any new concerns please seek additional medical care and evaluation. ? ? ?

## 2021-05-13 NOTE — ED Provider Triage Note (Signed)
Emergency Medicine Provider Triage Evaluation Note ? ?Katie Price , a 21 y.o. female  was evaluated in triage.  Pt complains of chest pain and shortness of breath.  Chest pain is left-sided.  She has a history of a PE last year and states that this feels similar.  She is not currently anticoagulated.  Her pain is pleuritic in nature. ? ? ?Physical Exam  ?BP (!) 144/98   Pulse (!) 103   Temp 98 ?F (36.7 ?C) (Oral)   Resp 16   Ht 5\' 9"  (1.753 m)   Wt 126.1 kg   LMP 05/08/2021 (Approximate)   SpO2 99%   BMI 41.05 kg/m?  ?Gen:   Awake, no distress   ?Resp:  Normal effort, lungs CTAB ?MSK:   Moves extremities without difficulty  ?Other:  Normal speech.  ? ?Medical Decision Making  ?Medically screening exam initiated at 4:48 PM.  Appropriate orders placed.  05/10/2021 was informed that the remainder of the evaluation will be completed by another provider, this initial triage assessment does not replace that evaluation, and the importance of remaining in the ED until their evaluation is complete. ? ?Note: Portions of this report may have been transcribed using voice recognition software. Every effort was made to ensure accuracy; however, inadvertent computerized transcription errors may be present ? ?  ?Katie Malling, PA-C ?05/13/21 1648 ? ?

## 2021-05-13 NOTE — ED Provider Notes (Signed)
?[facility] ?Provider Note ? ? ?CSN: [id number] ?Arrival date & time: 05/13/21  1610 ? ?  ? ?History ? ?Chief Complaint  ?Patient presents with  ? Chest Pain  ? Hypertension  ? ? ?Katie Price is a 21 y.o. female who presents today for evaluation of elevated heart rate, chest pain, and shortness of breath for 2 days. ?She has a history of PE about a year ago however that was reportedly postop and in the setting of OCP use. ?She denies any fevers.  She has not had any leg pain or swelling.  She does have a history of asthma, states she has been using her treatments at home.  She took a at home COVID test and was negative. ? ?She does report cough starting yesterday.  She works in [employer], [employer]. ? ?Her pain is pleuritic in nature.  It is made worse with deep breaths, made better with being still. ? ?HPI ? ?  ? ?Home Medications ?Prior to Admission medications   ?Medication Sig Start Date End Date Taking? Authorizing Provider  ?albuterol (PROVENTIL) (2.5 MG/3ML) 0.083% nebulizer solution Take 3 mLs (2.5 mg total) by nebulization every 4 (four) hours as needed for wheezing or shortness of breath. 09/29/20  Yes Bing Neighbors, FNP  ?diphenhydrAMINE (BENADRYL) 25 mg capsule Take 25 mg by mouth every 6 (six) hours as needed.   Yes [provider]  ?fexofenadine (ALLEGRA) 180 MG tablet Take 180 mg by mouth daily.   Yes [provider]  ?Multiple Vitamin (MULTIVITAMIN) tablet Take 1 tablet by mouth daily.   Yes [provider]  ?phentermine (ADIPEX-P) 37.5 MG tablet Take 37.5 mg by mouth daily. 03/28/21  Yes [provider]  ?SYMBICORT 80-4.5 MCG/ACT inhaler Inhale 1 puff into the lungs 2 (two) times daily. 02/26/19  Yes [provider]  ?traZODone (DESYREL) 100 MG tablet Take 100 mg by mouth at bedtime. 03/25/21  Yes [provider]  ?triamcinolone cream (KENALOG) 0.1 % Apply topically 2 (two) times daily. 01/27/21  Yes [provider]  ?VENTOLIN HFA  108 (90 Base) MCG/ACT inhaler Inhale 2 puffs into the lungs every 6 (six) hours as needed. 02/26/21  Yes [provider]  ?VITAMIN D PO Take by mouth daily.   Yes [provider]  ?doxycycline (VIBRAMYCIN) 100 MG capsule Take 1 capsule (100 mg total) by mouth 2 (two) times daily. ?Patient not taking: Reported on 05/13/2021 04/23/21   Jacklyn Shell, CNM  ?fluconazole (DIFLUCAN) 150 MG tablet 1 po stat; repeat in 3 days ?Patient not taking: Reported on 05/13/2021 04/23/21   Jacklyn Shell, CNM  ?tranexamic acid (LYSTEDA) 650 MG TABS tablet Take 2 tablets (1,300 mg total) by mouth 3 (three) times daily. ?Patient not taking: Reported on 05/13/2021 04/16/21   Jacklyn Shell, CNM  ?   ? ?Allergies    ?Adhesive [tape] and Latex   ? ?Review of Systems   ?Review of Systems ? ?Physical Exam ?Updated Vital Signs ?BP (!) 130/92   Pulse 84   Temp 98 ?F (36.7 ?C) (Oral)   Resp 20   Ht 5\' 9"  (1.753 m)   Wt 126.1 kg   LMP 05/08/2021 (Approximate)   SpO2 100%   BMI 41.05 kg/m?  ?Physical Exam ?Vitals and nursing note reviewed.  ?Constitutional:   ?   General: She is not in acute distress. ?   Appearance: She is not diaphoretic.  ?HENT:  ?   Head: Normocephalic and atraumatic.  ?Eyes:  ?  General: No scleral icterus.    ?   Right eye: No discharge.     ?   Left eye: No discharge.  ?   Conjunctiva/sclera: Conjunctivae normal.  ?Cardiovascular:  ?   Rate and Rhythm: Regular rhythm. Tachycardia present.  ?   Pulses:     ?     Radial pulses are 2+ on the right side and 2+ on the left side.  ?   Heart sounds: Normal heart sounds. No murmur heard. ?Pulmonary:  ?   Effort: Pulmonary effort is normal. No respiratory distress.  ?   Breath sounds: Normal breath sounds. No stridor. No decreased breath sounds or wheezing.  ?Chest:  ?   Chest wall: Tenderness (Patient over anterior chest recreates and exacerbates her reported pain.) present. No deformity or crepitus.  ?Abdominal:  ?   General:  There is no distension.  ?Musculoskeletal:     ?   General: No deformity.  ?   Cervical back: Normal range of motion and neck supple.  ?   Right lower leg: No tenderness. No edema.  ?   Left lower leg: No tenderness. No edema.  ?Skin: ?   General: Skin is warm and dry.  ?Neurological:  ?   Mental Status: She is alert.  ?   Motor: No abnormal muscle tone.  ?   Comments: Patient is awake and alert.  Speech is not slurred.  Answers questions appropriately without difficulty.  ?Psychiatric:     ?   Behavior: Behavior normal.  ? ? ?ED Results / Procedures / Treatments   ?Labs ?(all labs ordered are listed, but only abnormal results are displayed) ?Labs Reviewed  ?D-DIMER, QUANTITATIVE - Abnormal; Notable for the following components:  ?    Result Value  ? D-Dimer, Quant 0.63 (*)   ? All other components within normal limits  ?CBC WITH DIFFERENTIAL/PLATELET  ?BASIC METABOLIC PANEL  ?HCG, SERUM, QUALITATIVE  ?TROPONIN I (HIGH SENSITIVITY)  ?TROPONIN I (HIGH SENSITIVITY)  ? ? ?EKG ?None ? ?Radiology ?DG Chest 2 View ? ?Result Date: 05/13/2021 ?CLINICAL DATA:  Left-sided chest pain, short of breath and wheezing for 2 days EXAM: CHEST - 2 VIEW COMPARISON:  04/17/2020 FINDINGS: The heart size and mediastinal contours are within normal limits. Both lungs are clear. The visualized skeletal structures are unremarkable. IMPRESSION: No active cardiopulmonary disease. Electronically Signed   By: Sharlet SalinaMichael  Brown M.D.   On: 05/13/2021 17:23  ? ?CT Angio Chest PE W and/or Wo Contrast ? ?Result Date: 05/13/2021 ?CLINICAL DATA:  Chest pain, shortness of breath and tachycardia. EXAM: CT ANGIOGRAPHY CHEST WITH CONTRAST TECHNIQUE: Multidetector CT imaging of the chest was performed using the standard protocol during bolus administration of intravenous contrast. Multiplanar CT image reconstructions and MIPs were obtained to evaluate the vascular anatomy. RADIATION DOSE REDUCTION: This exam was performed according to the departmental  dose-optimization program which includes automated exposure control, adjustment of the mA and/or kV according to patient size and/or use of iterative reconstruction technique. CONTRAST:  80mL OMNIPAQUE IOHEXOL 350 MG/ML SOLN COMPARISON:  CTA chest 04/17/2020 FINDINGS: Cardiovascular: The cardiac size is normal. The aorta is normal in course and caliber without atherosclerosis. The great vessels are patent, with normal variant origin of the left vertebral artery from the aortic arch just proximal to the left subclavian takeoff. The pulmonary veins are decompressed. There is no pericardial effusion. The pulmonary arteries are normal in caliber without visible embolic filling defects. Mediastinum/Nodes: No enlarged mediastinal, hilar, or axillary lymph  nodes. Thyroid gland, trachea, and esophagus demonstrate no significant findings. Small volume of residual thymus is again noted in the substernal anterior mediastinum. Lungs/Pleura: No pleural effusion, thickening or pneumothorax. There is a linear scar-like opacity now seen in the posterior left base in the location of the prior consolidative infiltrate. There is a stable pleural-based 3 mm left lower lobe nodule on 6:84 and several 2-4 mm stable pleural-based or subpleural right middle lobe nodules on 6: 77-88. There is no new, enlarging or further nodule. No active infiltrate is seen. There is no evidence of interstitial disease or edema. Upper Abdomen: No acute abnormality. Musculoskeletal: Slight midthoracic kyphodextroscoliosis. No acute or suspicious bone lesions. No focal chest wall abnormality. Review of the MIP images confirms the above findings. IMPRESSION: 1. No acute chest CT or CTA findings. 2. Stable tiny 2-4 mm nodules. 3. Linear scar-like opacity in the area of previous left lower lobe consolidation. Electronically Signed   By: Almira Bar M.D.   On: 05/13/2021 20:44   ? ?Procedures ?Procedures  ? ? ?Medications Ordered in ED ?Medications  ?lidocaine  (LIDODERM) 5 % 1 patch (1 patch Transdermal Patch Applied 05/13/21 2249)  ?iohexol (OMNIPAQUE) 350 MG/ML injection 100 mL (80 mLs Intravenous Contrast Given 05/13/21 2030)  ? ? ?ED Course/ Medical Decision Making/ A&P ?

## 2021-05-13 NOTE — ED Triage Notes (Signed)
Pt reports HTN, tachycardia, CP and SOB for 2 days. Pt sent from UC due to hx of PE. Pt not on blood thinners anymore. Left sided chest pain.  ?

## 2021-05-25 ENCOUNTER — Ambulatory Visit (HOSPITAL_COMMUNITY)
Admission: RE | Admit: 2021-05-25 | Discharge: 2021-05-25 | Disposition: A | Discharge status: Home / Self Care | Payer: Medicaid Other | Source: Ambulatory Visit | Attending: Physician Assistant | Admitting: Physician Assistant

## 2021-05-25 DIAGNOSIS — R7989 Other specified abnormal findings of blood chemistry: Secondary | ICD-10-CM | POA: Insufficient documentation

## 2021-06-26 ENCOUNTER — Encounter: Payer: Self-pay | Admitting: Adult Health

## 2021-06-26 ENCOUNTER — Ambulatory Visit (INDEPENDENT_AMBULATORY_CARE_PROVIDER_SITE_OTHER): Payer: Medicaid Other | Admitting: Adult Health

## 2021-06-26 VITALS — BP 122/87 | HR 90 | Ht 68.0 in | Wt 278.0 lb

## 2021-06-26 DIAGNOSIS — Z30013 Encounter for initial prescription of injectable contraceptive: Secondary | ICD-10-CM | POA: Diagnosis not present

## 2021-06-26 DIAGNOSIS — Z86711 Personal history of pulmonary embolism: Secondary | ICD-10-CM | POA: Diagnosis not present

## 2021-06-26 DIAGNOSIS — Z3202 Encounter for pregnancy test, result negative: Secondary | ICD-10-CM | POA: Diagnosis not present

## 2021-06-26 DIAGNOSIS — Z3009 Encounter for other general counseling and advice on contraception: Secondary | ICD-10-CM | POA: Diagnosis not present

## 2021-06-26 LAB — POCT URINE PREGNANCY: Preg Test, Ur: NEGATIVE

## 2021-06-26 MED ORDER — MEDROXYPROGESTERONE ACETATE 150 MG/ML IM SUSP
150.0000 mg | Freq: Once | INTRAMUSCULAR | Status: AC
  Administered 2021-06-26: 150 mg via INTRAMUSCULAR

## 2021-06-26 MED ORDER — MEDROXYPROGESTERONE ACETATE 150 MG/ML IM SUSP: 150.0000 mg | INTRAMUSCULAR | 4 refills | Status: DC

## 2021-06-26 NOTE — Progress Notes (Signed)
Subjective:     Patient ID: Katie Price, female   DOB: 2000-12-10, 21 y.o.   MRN: [id number]  HPI Katie Price is a 21 year old black female, single, G1P0010, in to discuss birth control PCP recommended IUD has history of PE after nose surgery. She tried Micronor and it made her itch so she stopped it. She has used depo in the past and liked it but gained weight. She is interested in [facility] to talk with PCP.   Lab Results  Component Value Date   DIAGPAP  07/22/2020    - Negative for intraepithelial lesion or malignancy (NILM)   PCP is Dr Katie Price  Review of Systems Periods regular Patient denies any headaches, hearing loss, fatigue, blurred vision, shortness of breath, chest pain, abdominal pain, problems with bowel movements,(uses metamucil for constipation issues), urination, or intercourse. No joint pain or mood swings.  Reviewed past medical,surgical, social and family history. Reviewed medications and allergies.     Objective:   Physical Exam BP 122/87 (BP Location: Left Arm, Patient Position: Sitting, Cuff Size: Large)   Pulse 90   Ht 5\' 8"  (1.727 m)   Wt 278 lb (126.1 kg)   LMP 06/07/2021   BMI 42.27 kg/m  UPT is negative. Sex over 2 weeks ago.    Skin warm and dry. Neck: mid line trachea, normal thyroid, good ROM, no lymphadenopathy noted. Lungs: clear to ausculation bilaterally. Cardiovascular: regular rate and rhythm.  AA is 5 Fall risk is low    06/26/2021    8:42 AM 07/22/2020   11:49 AM 11/01/2019    2:35 PM  Depression screen PHQ 2/9  Decreased Interest 0 0 0  Down, Depressed, Hopeless 1 0 0  PHQ - 2 Score 1 0 0  Altered sleeping 0 0   Tired, decreased energy 0 0   Change in appetite 0 0   Feeling bad or failure about yourself  0 0   Trouble concentrating 0 0   Moving slowly or fidgety/restless 0 0   Suicidal thoughts 0 0   PHQ-9 Score 1 0        06/26/2021    8:42 AM 07/22/2020   11:49 AM  GAD 7 : Generalized Anxiety Score  Nervous, Anxious, on Edge 1 0   Control/stop worrying 0 0  Worry too much - different things 0 0  Trouble relaxing 0 0  Restless 0 0  Easily annoyed or irritable 0 0  Afraid - awful might happen 0 0  Total GAD 7 Score 1 0      Upstream - 06/26/21 0842       Pregnancy Intention Screening   Does the patient want to become pregnant in the next year? No    Does the patient's partner want to become pregnant in the next year? No    Would the patient like to discuss contraceptive options today? Yes      Contraception Wrap Up   Current Method Female Condom    End Method Hormonal Injection    Contraception Counseling Provided Yes             Assessment:     1. Encounter for initial prescription of injectable contraceptive Will get depo in office today Rx sent in for depo Meds ordered this encounter  Medications   medroxyPROGESTERone (DEPO-PROVERA) 150 MG/ML injection    Sig: Inject 1 mL (150 mg total) into the muscle every 3 (three) months.    Dispense:  1 mL  Refill:  4    Order Specific Question:   Supervising Provider    Answer:   Despina Hidden, LUTHER H [[id number]]     2. General counseling and advice on contraceptive management Discussed options and she wants depo today but may switch to mirena, will need Cytotec before insertion  Reviewed CDC contraception 2016 with her, and depo and mirena both 1  3. History of pulmonary embolism Had PE after nose surgery  4. Pregnancy examination or test, negative result     Plan:     Follow up with me in 10 weeks to see if wants to continue depo or get mirena IUD

## 2021-06-26 NOTE — Addendum Note (Signed)
Addended by: Colen Darling on: 06/26/2021 09:13 AM   Modules accepted: Orders

## 2021-07-21 ENCOUNTER — Ambulatory Visit (INDEPENDENT_AMBULATORY_CARE_PROVIDER_SITE_OTHER): Payer: Medicaid Other | Admitting: *Deleted

## 2021-07-21 ENCOUNTER — Other Ambulatory Visit: Payer: Self-pay | Admitting: *Deleted

## 2021-07-21 VITALS — BP 121/77 | HR 92 | Wt 275.6 lb

## 2021-07-21 DIAGNOSIS — Z3201 Encounter for pregnancy test, result positive: Secondary | ICD-10-CM

## 2021-07-21 DIAGNOSIS — O3680X0 Pregnancy with inconclusive fetal viability, not applicable or unspecified: Secondary | ICD-10-CM

## 2021-07-21 DIAGNOSIS — N926 Irregular menstruation, unspecified: Secondary | ICD-10-CM

## 2021-07-21 LAB — POCT URINE PREGNANCY: Preg Test, Ur: POSITIVE — AB

## 2021-07-22 LAB — BETA HCG QUANT (REF LAB): hCG Quant: <1 m[IU]/mL

## 2021-08-04 ENCOUNTER — Other Ambulatory Visit (HOSPITAL_BASED_OUTPATIENT_CLINIC_OR_DEPARTMENT_OTHER): Payer: Medicaid Other

## 2021-08-10 ENCOUNTER — Other Ambulatory Visit: Payer: Self-pay

## 2021-08-10 MED ORDER — PREDNISONE 10 MG (21) PO TBPK
ORAL_TABLET | ORAL | 0 refills | Status: DC
  Filled 2021-08-10: qty 21, 6d supply, fill #0

## 2021-08-12 ENCOUNTER — Other Ambulatory Visit (HOSPITAL_COMMUNITY): Payer: Self-pay

## 2021-08-21 ENCOUNTER — Other Ambulatory Visit: Payer: Self-pay

## 2021-08-24 ENCOUNTER — Other Ambulatory Visit: Payer: Self-pay

## 2021-09-01 ENCOUNTER — Other Ambulatory Visit (HOSPITAL_COMMUNITY): Payer: Self-pay

## 2021-09-02 ENCOUNTER — Other Ambulatory Visit (HOSPITAL_COMMUNITY): Payer: Self-pay

## 2021-09-03 ENCOUNTER — Other Ambulatory Visit (HOSPITAL_COMMUNITY): Payer: Self-pay

## 2021-09-04 ENCOUNTER — Encounter: Payer: Self-pay | Admitting: Adult Health

## 2021-09-04 ENCOUNTER — Ambulatory Visit (INDEPENDENT_AMBULATORY_CARE_PROVIDER_SITE_OTHER): Payer: No Typology Code available for payment source | Admitting: Adult Health

## 2021-09-04 ENCOUNTER — Other Ambulatory Visit (HOSPITAL_COMMUNITY): Payer: Self-pay

## 2021-09-04 ENCOUNTER — Ambulatory Visit: Payer: Medicaid Other | Admitting: Adult Health

## 2021-09-04 ENCOUNTER — Other Ambulatory Visit (HOSPITAL_COMMUNITY)
Admission: RE | Admit: 2021-09-04 | Discharge: 2021-09-04 | Disposition: A | Discharge status: Home / Self Care | Payer: No Typology Code available for payment source | Source: Ambulatory Visit | Attending: Adult Health | Admitting: Adult Health

## 2021-09-04 VITALS — BP 134/95 | HR 82 | Ht 68.0 in | Wt 285.0 lb

## 2021-09-04 DIAGNOSIS — R030 Elevated blood-pressure reading, without diagnosis of hypertension: Secondary | ICD-10-CM | POA: Diagnosis not present

## 2021-09-04 DIAGNOSIS — F32A Depression, unspecified: Secondary | ICD-10-CM | POA: Insufficient documentation

## 2021-09-04 DIAGNOSIS — Z113 Encounter for screening for infections with a predominantly sexual mode of transmission: Secondary | ICD-10-CM

## 2021-09-04 DIAGNOSIS — F419 Anxiety disorder, unspecified: Secondary | ICD-10-CM

## 2021-09-04 DIAGNOSIS — Z01419 Encounter for gynecological examination (general) (routine) without abnormal findings: Secondary | ICD-10-CM | POA: Diagnosis not present

## 2021-09-04 HISTORY — DX: Anxiety disorder, unspecified: F41.9

## 2021-09-04 HISTORY — DX: Depression, unspecified: F32.A

## 2021-09-04 MED ORDER — BUSPIRONE HCL 5 MG PO TABS
5.0000 mg | ORAL_TABLET | Freq: Three times a day (TID) | ORAL | 3 refills | Status: DC
  Filled 2021-09-04 – 2021-09-09 (×2): qty 90, 30d supply, fill #0
  Filled 2021-09-30 (×2): qty 90, 30d supply, fill #1

## 2021-09-04 NOTE — Progress Notes (Signed)
Patient ID: Katie Price, female   DOB: Nov 09, 2000, 21 y.o.   MRN: [id number] History of Present Illness: Katie Price is a 21 year old black female,single, G2P0010, in for a well woman gyn exam. She requests STD testing. She works at [employer] [address], and going to [employer] school in the Fall.  Lab Results  Component Value Date   DIAGPAP  07/22/2020    - Negative for intraepithelial lesion or malignancy (NILM)   PCP is Dr Margo Aye.  Current Medications, Allergies, Past Medical History, Past Surgical History, Family History and Social History were reviewed in [name].     Review of Systems: Patient denies any headaches, hearing loss, fatigue, blurred vision, shortness of breath, chest pain, abdominal pain, problems with bowel movements, urination, or intercourse. No joint pain or mood swings.     Physical Exam:BP (!) 134/95 (BP Location: Left Arm, Cuff Size: Large)   Pulse 82   Ht 5\' 8"  (1.727 m)   Wt 285 lb (129.3 kg)   LMP 07/21/2021   Breastfeeding No   BMI 43.33 kg/m   General:  Well developed, well nourished, no acute distress Skin:  Warm and dry Neck:  Midline trachea, normal thyroid, good ROM, no lymphadenopathy Lungs; Clear to auscultation bilaterally Breast:  No dominant palpable mass, retraction, or nipple discharge,has bilateral nipple rods Cardiovascular: Regular rate and rhythm Abdomen:  Soft, non tender, no hepatosplenomegaly Pelvic:  External genitalia is normal in appearance, no lesions.  The vagina is normal in appearance. Urethra has no lesions or masses. The cervix is smooth, CV swab obtained.  Uterus is felt to be normal size, shape, and contour.  No adnexal masses or tenderness noted.Bladder is non tender, no masses felt. Extremities/musculoskeletal:  No swelling or varicosities noted, no clubbing or cyanosis Psych:  No mood changes, alert and cooperative,seems happy AA is 4 Fall risk is low    09/04/2021   11:57 AM 06/26/2021    8:42 AM 07/22/2020    11:49 AM  Depression screen PHQ 2/9  Decreased Interest 1 0 0  Down, Depressed, Hopeless 1 1 0  PHQ - 2 Score 2 1 0  Altered sleeping 2 0 0  Tired, decreased energy 2 0 0  Change in appetite 0 0 0  Feeling bad or failure about yourself  1 0 0  Trouble concentrating 1 0 0  Moving slowly or fidgety/restless 0 0 0  Suicidal thoughts 0 0 0  PHQ-9 Score 8 1 0       09/04/2021   11:57 AM 06/26/2021    8:42 AM 07/22/2020   11:49 AM  GAD 7 : Generalized Anxiety Score  Nervous, Anxious, on Edge 2 1 0  Control/stop worrying 0 0 0  Worry too much - different things 2 0 0  Trouble relaxing 1 0 0  Restless 1 0 0  Easily annoyed or irritable 2 0 0  Afraid - awful might happen 0 0 0  Total GAD 7 Score 8 1 0      Upstream - 09/04/21 1157       Pregnancy Intention Screening   Does the patient want to become pregnant in the next year? No    Does the patient's partner want to become pregnant in the next year? No    Would the patient like to discuss contraceptive options today? No      Contraception Wrap Up   Current Method Hormonal Injection    End Method Hormonal Injection  Contraception Counseling Provided No            Examination chaperoned by Katie Rogue LPN   Impression and Plan: 1. Encounter for well woman exam with routine gynecological exam Physical in 1 year Pap in 2025  2. Screening examination for STD (sexually transmitted disease) CV swab sent for GC/CHL and trich - Cervicovaginal ancillary only( [facility])  3. Elevated BP without diagnosis of hypertension Keep check at work and follow up with [name]  4. Anxiety and depression Does feel anxious Will rx buspar Meds ordered this encounter  Medications   busPIRone (BUSPAR) 5 MG tablet    Sig: Take 1 tablet by mouth 3 times daily.    Dispense:  90 tablet    Refill:  3    Order Specific Question:   Supervising Provider    Answer:   Duane Lope H [[id number]]   Follow up in 8 weeks for ROS  and have BP  reading for me

## 2021-09-07 LAB — CERVICOVAGINAL ANCILLARY ONLY
Chlamydia: NEGATIVE
Comment: NEGATIVE
Comment: NEGATIVE
Comment: NORMAL
Neisseria Gonorrhea: NEGATIVE
Trichomonas: NEGATIVE

## 2021-09-09 ENCOUNTER — Other Ambulatory Visit (HOSPITAL_COMMUNITY): Payer: Self-pay

## 2021-09-09 MED ORDER — SEMAGLUTIDE-WEIGHT MANAGEMENT 0.25 MG/0.5ML ~~LOC~~ SOAJ
0.2500 mg | SUBCUTANEOUS | 0 refills | Status: DC
  Filled 2021-09-09 – 2021-09-14 (×2): qty 2, 28d supply, fill #0

## 2021-09-09 MED ORDER — PROMETHAZINE-DM 6.25-15 MG/5ML PO SYRP
5.0000 mL | ORAL_SOLUTION | Freq: Four times a day (QID) | ORAL | 0 refills | Status: DC
  Filled 2021-09-09: qty 200, 10d supply, fill #0

## 2021-09-09 MED ORDER — ALBUTEROL SULFATE (2.5 MG/3ML) 0.083% IN NEBU
3.0000 mL | INHALATION_SOLUTION | Freq: Three times a day (TID) | RESPIRATORY_TRACT | 0 refills | Status: DC
  Filled 2021-09-09: qty 75, 9d supply, fill #0

## 2021-09-09 MED ORDER — HYDROCHLOROTHIAZIDE 12.5 MG PO TABS
12.5000 mg | ORAL_TABLET | Freq: Every day | ORAL | 0 refills | Status: DC
  Filled 2021-09-09: qty 30, 30d supply, fill #0

## 2021-09-11 ENCOUNTER — Other Ambulatory Visit (HOSPITAL_COMMUNITY): Payer: Self-pay

## 2021-09-14 ENCOUNTER — Other Ambulatory Visit (HOSPITAL_COMMUNITY): Payer: Self-pay

## 2021-09-14 ENCOUNTER — Ambulatory Visit
Admission: EM | Admit: 2021-09-14 | Discharge: 2021-09-14 | Disposition: A | Discharge status: Home / Self Care | Payer: No Typology Code available for payment source | Attending: Family Medicine | Admitting: Family Medicine

## 2021-09-14 DIAGNOSIS — J3089 Other allergic rhinitis: Secondary | ICD-10-CM | POA: Diagnosis present

## 2021-09-14 DIAGNOSIS — J4521 Mild intermittent asthma with (acute) exacerbation: Secondary | ICD-10-CM | POA: Insufficient documentation

## 2021-09-14 DIAGNOSIS — N898 Other specified noninflammatory disorders of vagina: Secondary | ICD-10-CM | POA: Diagnosis present

## 2021-09-14 MED ORDER — IPRATROPIUM-ALBUTEROL 0.5-2.5 (3) MG/3ML IN SOLN
3.0000 mL | RESPIRATORY_TRACT | 0 refills | Status: DC | PRN
  Filled 2021-09-14: qty 360, 20d supply, fill #0

## 2021-09-14 MED ORDER — FLUTICASONE-SALMETEROL 250-50 MCG/ACT IN AEPB
1.0000 | INHALATION_SPRAY | Freq: Two times a day (BID) | RESPIRATORY_TRACT | 2 refills | Status: DC
Start: 2021-09-14 — End: 2023-09-23
  Filled 2021-09-14 – 2021-12-04 (×2): qty 60, 30d supply, fill #0
  Filled 2022-01-26: qty 60, 30d supply, fill #1

## 2021-09-14 MED ORDER — ALBUTEROL SULFATE HFA 108 (90 BASE) MCG/ACT IN AERS
1.0000 | INHALATION_SPRAY | Freq: Four times a day (QID) | RESPIRATORY_TRACT | 0 refills | Status: DC | PRN
  Filled 2021-09-14: qty 6.7, 25d supply, fill #0

## 2021-09-14 MED ORDER — BUDESONIDE-FORMOTEROL FUMARATE 160-4.5 MCG/ACT IN AERO
2.0000 | INHALATION_SPRAY | Freq: Two times a day (BID) | RESPIRATORY_TRACT | 12 refills | Status: DC
  Filled 2021-09-14: qty 10.2, 30d supply, fill #0

## 2021-09-14 NOTE — ED Triage Notes (Signed)
Pt also wants std screen for vaginal itching

## 2021-09-14 NOTE — ED Triage Notes (Signed)
Pt presents with persistent cough for past month, has had 2 rounds of prednisone with little relief. Covid in July

## 2021-09-14 NOTE — ED Provider Notes (Signed)
[facility]    CSN: [id number] Arrival date & time: 09/14/21  1258      History   Chief Complaint Chief Complaint  Patient presents with   Cough    HPI Katie Price is a 21 y.o. female.   Presenting today with persistent cough is 1 month since having COVID.  She states the cough is nonproductive, occasional wheezing and shortness of breath associated.  She has been on 2 rounds of prednisone and takes albuterol nebulizer treatments with minimal relief.  She does have a history of asthma, previously on Symbicort consistently but has not had a refill in quite some time.  She states she tried some DuoNeb solution at 1 point that helped tremendously and requesting this today.  Denies fever, chills, chest pain, abdominal pain, nausea vomiting diarrhea.  Does have history of seasonal allergies on Allegra and Benadryl daily.  She is also requesting a vaginal swab as she has some vaginal itching.  History of recurrent BV and yeast infections.  Not tried anything over-the-counter for symptoms.  No known exposures to STDs.   Past Medical History:  Diagnosis Date   Asthma    Bipolar disorder (HCC)    PE (pulmonary thromboembolism) (HCC)    Tonsillar and adenoid hypertrophy 07/2016   snores during sleep, denies apnea    Patient Active Problem List   Diagnosis Date Noted   Elevated BP without diagnosis of hypertension 09/04/2021   Encounter for well woman exam with routine gynecological exam 09/04/2021   Anxiety and depression 09/04/2021   Encounter for initial prescription of injectable contraceptive 06/26/2021   General counseling and advice on contraceptive management 11/10/2020   Encounter for gynecological examination with Papanicolaou smear of cervix 07/22/2020   Polypoid sinus degeneration 07/02/2020   Anaphylactic shock due to adverse food reaction 07/02/2020   Seasonal and perennial allergic rhinitis 07/02/2020   Mild persistent asthma, uncomplicated 07/02/2020    History of pulmonary embolism 06/04/2020   Encounter for initial prescription of contraceptive pills 06/04/2020   Encounter for initial prescription of contraceptives 06/04/2020   Irregular bleeding 11/01/2019   Encounter for initial prescription of vaginal ring hormonal contraceptive 11/01/2019   Menorrhagia with irregular cycle 06/15/2017   Pregnancy examination or test, negative result 06/15/2017   Screening examination for STD (sexually transmitted disease) 06/15/2017   DMDD (disruptive mood dysregulation disorder) (HCC) 09/17/2016   MDD (major depressive disorder) 09/12/2016   Bipolar 1 disorder (HCC) 04/10/2016   Irritability and anger 04/10/2016   ODD (oppositional defiant disorder) 05/14/2014   Bipolar 1 disorder, depressed, moderate (HCC) 05/14/2014   Lives in group home 04/12/2014   Obesity 04/12/2014   Eczema 04/12/2014   Seasonal allergies 04/12/2014   Acanthosis nigricans 04/12/2014   Failed vision screen 04/12/2014    Past Surgical History:  Procedure Laterality Date   ETHMOIDECTOMY Right 03/31/2020   Procedure: RIGHT TOTAL ETHMOIDECTOMY AND FRONTAL RECESS exploration;  Surgeon: Newman Pies, MD;  Location: [facility];  Service: ENT;  Laterality: Right;   MAXILLARY ANTROSTOMY Bilateral 03/31/2020   Procedure: ENDOSCOPIC MAXILLARY ANTROSTOMY WITH TISSUE REMOVAL;  Surgeon: Newman Pies, MD;  Location: [facility];  Service: ENT;  Laterality: Bilateral;   SINUS ENDO WITH FUSION Bilateral 03/31/2020   Procedure: SINUS ENDOSCOPY WITH FUSION NAVIGATION;  Surgeon: Newman Pies, MD;  Location: [facility];  Service: ENT;  Laterality: Bilateral;   TONSILLECTOMY AND ADENOIDECTOMY N/A 08/02/2016   Procedure: TONSILLECTOMY AND ADENOIDECTOMY;  Surgeon: Newman Pies, MD;  Location: [facility];  Service: ENT;  Laterality: N/A;   TURBINATE REDUCTION Bilateral 03/31/2020   Procedure: TURBINATE REDUCTION;  Surgeon: Newman Pies, MD;  Location:   SURGERY CENTER;  Service: ENT;  Laterality: Bilateral;   WISDOM TOOTH EXTRACTION      OB History     Gravida  2   Para      Term      Preterm      AB  1   Living         SAB      IAB      Ectopic      Multiple      Live Births              Home Medications    Prior to Admission medications   Medication Sig Start Date End Date Taking? Authorizing Provider  albuterol (VENTOLIN HFA) 108 (90 Base) MCG/ACT inhaler Inhale 1-2 puffs into the lungs every 6 (six) hours as needed for wheezing or shortness of breath. 09/14/21  Yes Particia Nearing, PA-C  budesonide-formoterol [facility]) 160-4.5 MCG/ACT inhaler Inhale 2 puffs into the lungs 2 (two) times daily. 09/14/21  Yes Particia Nearing, PA-C  ipratropium-albuterol (DUONEB) 0.5-2.5 (3) MG/3ML SOLN Take 3 mLs by nebulization every 4 (four) hours as needed. 09/14/21  Yes Particia Nearing, PA-C  albuterol (PROVENTIL) (2.5 MG/3ML) 0.083% nebulizer solution Take 3 mLs (2.5 mg total) by nebulization every 4 (four) hours as needed for wheezing or shortness of breath. 09/29/20   Bing Neighbors, FNP  albuterol (PROVENTIL) (2.5 MG/3ML) 0.083% nebulizer solution Take 3 mLs by nebulization 3 (three) times daily. 02/27/21     busPIRone (BUSPAR) 5 MG tablet Take 1 tablet by mouth 3 times daily. 09/04/21   Adline Potter, NP  diphenhydrAMINE (BENADRYL) 25 mg capsule Take 25 mg by mouth every 6 (six) hours as needed.    [provider]  fexofenadine (ALLEGRA) 180 MG tablet Take 180 mg by mouth daily.    [provider]  hydrochlorothiazide (HYDRODIURIL) 12.5 MG tablet Take 1 tablet (12.5 mg total) by mouth daily. 07/21/21     medroxyPROGESTERone (DEPO-PROVERA) 150 MG/ML injection Inject 1 mL (150 mg total) into the muscle every 3 (three) months. 06/26/21   Adline Potter, NP  Multiple Vitamin (MULTIVITAMIN) tablet Take 1 tablet by mouth daily.    [provider]   promethazine-dextromethorphan (PROMETHAZINE-DM) 6.25-15 MG/5ML syrup Take 5 mLs by mouth every 6 (six) hours for 10 days. 08/22/21     Semaglutide-Weight Management 0.25 MG/0.5ML SOAJ Inject 0.25 mg into the skin once a week. 08/31/21     SYMBICORT 80-4.5 MCG/ACT inhaler Inhale 1 puff into the lungs 2 (two) times daily. 02/26/19   [provider]  traZODone (DESYREL) 100 MG tablet Take 100 mg by mouth at bedtime. 03/25/21   [provider]  triamcinolone cream (KENALOG) 0.1 % Apply topically 2 (two) times daily. 01/27/21   [provider]  VENTOLIN HFA 108 (90 Base) MCG/ACT inhaler Inhale 2 puffs into the lungs every 6 (six) hours as needed. 02/26/21   [provider]  VITAMIN D PO Take by mouth daily.    [provider]   Family History Family History  Problem Relation Age of Onset   Hypertension Mother    Cancer Mother        cervical   Diabetes Mother    Other Maternal Grandmother  pre diabetic   Other Paternal Grandmother        brain tumor   Social History Social History   Tobacco Use   Smoking status: Former    Years: 1.00    Types: Cigarettes   Smokeless tobacco: Never  Vaping Use   Vaping Use: Never used  Substance Use Topics   Alcohol use: Not Currently    Comment: socially   Drug use: Yes    Types: Marijuana    Comment: occassionally     Allergies   Adhesive [tape], Latex, and Phentermine   Review of Systems Review of Systems PER HPI  Physical Exam Triage Vital Signs ED Triage Vitals  Enc Vitals Group     BP 09/14/21 1409 (!) 139/97     Pulse Rate 09/14/21 1409 98     Resp 09/14/21 1409 20     Temp 09/14/21 1409 99.2 F (37.3 C)     Temp src --      SpO2 09/14/21 1409 98 %     Weight --      Height --      Head Circumference --      Peak Flow --      Pain Score 09/14/21 1407 0     Pain Loc --      Pain Edu? --      Excl. in GC? --    No data found.  Updated Vital Signs BP (!) 139/97   Pulse 98    Temp 99.2 F (37.3 C)   Resp 20   LMP 07/21/2021   SpO2 98%   Visual Acuity Right Eye Distance:   Left Eye Distance:   Bilateral Distance:    Right Eye Near:   Left Eye Near:    Bilateral Near:     Physical Exam Vitals and nursing note reviewed.  Constitutional:      Appearance: Normal appearance. She is not ill-appearing.  HENT:     Head: Atraumatic.     Nose: Nose normal.     Mouth/Throat:     Mouth: Mucous membranes are moist.     Pharynx: Oropharynx is clear.  Eyes:     Extraocular Movements: Extraocular movements intact.     Conjunctiva/sclera: Conjunctivae normal.  Cardiovascular:     Rate and Rhythm: Normal rate and regular rhythm.     Heart sounds: Normal heart sounds.  Pulmonary:     Effort: Pulmonary effort is normal.     Breath sounds: Normal breath sounds. No wheezing or rales.  Genitourinary:    Comments: GU exam deferred, self swab performed Musculoskeletal:        General: Normal range of motion.     Cervical back: Normal range of motion and neck supple.  Skin:    General: Skin is warm and dry.  Neurological:     Mental Status: She is alert and oriented to person, place, and time.  Psychiatric:        Mood and Affect: Mood normal.        Thought Content: Thought content normal.        Judgment: Judgment normal.    UC Treatments / Results  Labs (all labs ordered are listed, but only abnormal results are displayed) Labs Reviewed  CERVICOVAGINAL ANCILLARY ONLY   EKG  Radiology No results found.  Procedures Procedures (including critical care time)  Medications Ordered in UC Medications - No data to display  Initial Impression / Assessment and Plan / UC Course  I  have reviewed the triage vital signs and the nursing notes.  Pertinent labs & imaging results that were available during my care of the patient were reviewed by me and considered in my medical decision making (see chart for details).     We will restart consistent inhaler  regimen with Symbicort, albuterol as needed and DuoNeb since she states this works better than albuterol for her.  Continue good allergy regimen with daily antihistamine, nasal sprays as needed.  Discussed supportive measures and return precautions.  Vaginal swab pending, treat based on results.  Final Clinical Impressions(s) / UC Diagnoses   Final diagnoses:  Seasonal allergic rhinitis due to other allergic trigger  Mild intermittent asthma with acute exacerbation  Vaginal itching   Discharge Instructions   None    ED Prescriptions     Medication Sig Dispense Auth. Provider   ipratropium-albuterol (DUONEB) 0.5-2.5 (3) MG/3ML SOLN Take 3 mLs by nebulization every 4 (four) hours as needed. 360 mL Particia Nearing, PA-C   budesonide-formoterol [facility]) 160-4.5 MCG/ACT inhaler Inhale 2 puffs into the lungs 2 (two) times daily. 10.2 g Particia Nearing, PA-C   albuterol (VENTOLIN HFA) 108 (90 Base) MCG/ACT inhaler Inhale 1-2 puffs into the lungs every 6 (six) hours as needed for wheezing or shortness of breath. 6.7 g Particia Nearing, [place]      PDMP not reviewed this encounter.   Particia Nearing, [place] 09/14/21 1550

## 2021-09-15 LAB — CERVICOVAGINAL ANCILLARY ONLY
Bacterial Vaginitis (gardnerella): NEGATIVE
Candida Glabrata: NEGATIVE
Candida Vaginitis: NEGATIVE
Chlamydia: NEGATIVE
Comment: NEGATIVE
Comment: NEGATIVE
Comment: NEGATIVE
Comment: NEGATIVE
Comment: NEGATIVE
Comment: NORMAL
Neisseria Gonorrhea: NEGATIVE
Trichomonas: NEGATIVE

## 2021-09-30 ENCOUNTER — Other Ambulatory Visit (HOSPITAL_COMMUNITY): Payer: Self-pay

## 2021-10-05 ENCOUNTER — Other Ambulatory Visit (HOSPITAL_COMMUNITY): Payer: Self-pay

## 2021-10-06 ENCOUNTER — Other Ambulatory Visit (HOSPITAL_COMMUNITY): Payer: Self-pay

## 2021-10-07 ENCOUNTER — Other Ambulatory Visit (HOSPITAL_COMMUNITY): Payer: Self-pay

## 2021-10-07 MED ORDER — WEGOVY 0.25 MG/0.5ML ~~LOC~~ SOAJ
0.2500 mg | SUBCUTANEOUS | 0 refills | Status: DC
  Filled 2021-10-07: qty 2, 28d supply, fill #0

## 2021-10-08 ENCOUNTER — Other Ambulatory Visit (HOSPITAL_COMMUNITY): Payer: Self-pay

## 2021-10-08 ENCOUNTER — Other Ambulatory Visit: Payer: Self-pay

## 2021-10-08 MED ORDER — WEGOVY 0.5 MG/0.5ML ~~LOC~~ SOAJ
0.5000 mg | SUBCUTANEOUS | 0 refills | Status: DC
  Filled 2021-10-08: qty 2, 28d supply, fill #0

## 2021-10-08 MED ORDER — WEGOVY 0.5 MG/0.5ML ~~LOC~~ SOAJ
0.5000 mL | SUBCUTANEOUS | 0 refills | Status: DC
  Filled 2021-10-08: qty 2, 28d supply, fill #0

## 2021-10-12 ENCOUNTER — Other Ambulatory Visit (HOSPITAL_COMMUNITY): Payer: Self-pay

## 2021-10-12 MED ORDER — WEGOVY 1 MG/0.5ML ~~LOC~~ SOAJ
1.0000 mg | SUBCUTANEOUS | 2 refills | Status: DC
  Filled 2021-10-12: qty 2, 28d supply, fill #0
  Filled 2021-11-02 – 2021-11-03 (×2): qty 2, 28d supply, fill #1

## 2021-10-12 MED ORDER — DESVENLAFAXINE SUCCINATE ER 50 MG PO TB24
50.0000 mg | ORAL_TABLET | Freq: Every day | ORAL | 0 refills | Status: DC
  Filled 2021-10-12: qty 90, 90d supply, fill #0

## 2021-10-20 ENCOUNTER — Encounter (HOSPITAL_COMMUNITY): Payer: Self-pay | Admitting: Psychiatry

## 2021-10-20 ENCOUNTER — Telehealth (HOSPITAL_COMMUNITY): Payer: Self-pay

## 2021-10-20 ENCOUNTER — Ambulatory Visit (INDEPENDENT_AMBULATORY_CARE_PROVIDER_SITE_OTHER): Payer: No Typology Code available for payment source | Admitting: Psychiatry

## 2021-10-20 DIAGNOSIS — F411 Generalized anxiety disorder: Secondary | ICD-10-CM | POA: Diagnosis not present

## 2021-10-20 DIAGNOSIS — G4721 Circadian rhythm sleep disorder, delayed sleep phase type: Secondary | ICD-10-CM | POA: Diagnosis not present

## 2021-10-20 DIAGNOSIS — F4310 Post-traumatic stress disorder, unspecified: Secondary | ICD-10-CM | POA: Diagnosis not present

## 2021-10-20 NOTE — Telephone Encounter (Signed)
Called pt to schedule 1 month 30 min f/u with Dr Nehemiah Settle and np therapy no answer left vm
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
## 2021-10-20 NOTE — Patient Instructions (Signed)
It is ok to start taking your pristiq as prescribed by your primary care provider. We will meet again in 1 month to see how things are going. You can look on google for CBT-I (insomnia) hand outs that may be helpful in getting to sleep. Dr. Nehemiah Settle has placed a referral for psychotherapy.

## 2021-10-21 NOTE — Telephone Encounter (Signed)
scheduled

## 2021-11-02 ENCOUNTER — Telehealth (HOSPITAL_COMMUNITY): Payer: Self-pay

## 2021-11-02 ENCOUNTER — Other Ambulatory Visit (HOSPITAL_COMMUNITY): Payer: Self-pay

## 2021-11-02 NOTE — Telephone Encounter (Signed)
Pt brought in paperwork will send to scan tomorrow when courier comes and follow up when in chart

## 2021-11-02 NOTE — Telephone Encounter (Signed)
Pt called in stating that her medication pristiq 50 mg is not working for, she states that she has had diarrhea for 2 weeks now and is wanting to see about switching to something else. She states that she did do a gene test and has the results however she is unable to send a message to provider on mychart. Pt states that she can bring it over and we can get a copy into her chart. Please advise.

## 2021-11-04 ENCOUNTER — Ambulatory Visit: Payer: No Typology Code available for payment source | Admitting: Adult Health

## 2021-11-04 ENCOUNTER — Other Ambulatory Visit (HOSPITAL_COMMUNITY): Payer: Self-pay

## 2021-11-05 ENCOUNTER — Other Ambulatory Visit (HOSPITAL_COMMUNITY): Payer: Self-pay

## 2021-11-06 ENCOUNTER — Telehealth (INDEPENDENT_AMBULATORY_CARE_PROVIDER_SITE_OTHER): Payer: No Typology Code available for payment source | Admitting: Psychiatry

## 2021-11-06 ENCOUNTER — Other Ambulatory Visit: Payer: Self-pay

## 2021-11-06 ENCOUNTER — Other Ambulatory Visit (HOSPITAL_COMMUNITY): Payer: Self-pay

## 2021-11-06 ENCOUNTER — Encounter (HOSPITAL_COMMUNITY): Payer: Self-pay

## 2021-11-06 ENCOUNTER — Encounter (HOSPITAL_COMMUNITY): Payer: Self-pay | Admitting: Psychiatry

## 2021-11-06 DIAGNOSIS — F4310 Post-traumatic stress disorder, unspecified: Secondary | ICD-10-CM | POA: Diagnosis not present

## 2021-11-06 DIAGNOSIS — G4721 Circadian rhythm sleep disorder, delayed sleep phase type: Secondary | ICD-10-CM | POA: Diagnosis not present

## 2021-11-06 DIAGNOSIS — F411 Generalized anxiety disorder: Secondary | ICD-10-CM | POA: Diagnosis not present

## 2021-11-06 DIAGNOSIS — F3481 Disruptive mood dysregulation disorder: Secondary | ICD-10-CM | POA: Diagnosis not present

## 2021-11-06 MED ORDER — SERTRALINE HCL 25 MG PO TABS
ORAL_TABLET | ORAL | 0 refills | Status: DC
  Filled 2021-11-06: qty 30, 60d supply, fill #0

## 2021-11-06 NOTE — Patient Instructions (Signed)
We discontinued the pristiq between appointments. Next, we will trial zoloft at 12.5mg  daily for the next two weeks and will check in via mychart at that point, may increase to 25mg  depending on how your body is responding to it.
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
## 2021-11-10 ENCOUNTER — Encounter (HOSPITAL_COMMUNITY): Payer: Self-pay

## 2021-11-10 ENCOUNTER — Ambulatory Visit (INDEPENDENT_AMBULATORY_CARE_PROVIDER_SITE_OTHER): Payer: No Typology Code available for payment source | Admitting: Clinical

## 2021-11-10 DIAGNOSIS — F411 Generalized anxiety disorder: Secondary | ICD-10-CM | POA: Diagnosis not present

## 2021-11-10 DIAGNOSIS — F4310 Post-traumatic stress disorder, unspecified: Secondary | ICD-10-CM | POA: Diagnosis not present
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
## 2021-11-10 NOTE — Plan of Care (Signed)
Verbal Consent 

## 2021-11-19 ENCOUNTER — Telehealth (HOSPITAL_COMMUNITY): Payer: No Typology Code available for payment source | Admitting: Psychiatry

## 2021-11-21 IMAGING — CT CT MAXILLOFACIAL W/O CM
3 of 4 series · 13 of 47 positions shown, 15 images · non-contrast
Comparison: None.

CLINICAL DATA: Chronic maxillary sinusitis.

EXAM:
CT MAXILLOFACIAL WITHOUT CONTRAST
TECHNIQUE: Multidetector CT images of the paranasal sinuses were obtained using
the standard protocol without intravenous contrast.

[Series 2: standard · axial · 0.36mm/px · z∈[-98,-6]mm · 8 of 107 slices shown, 10 images]
[im 8/107  brain]
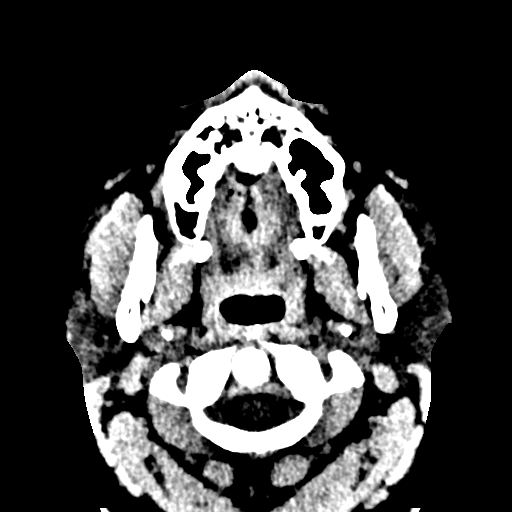
[im 8/107  bone]
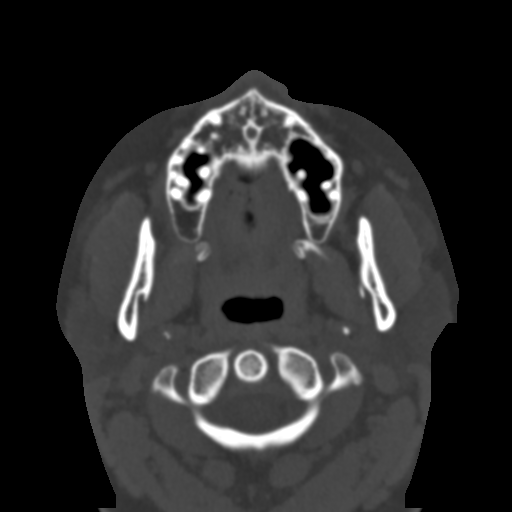
[im 22/107  bone]
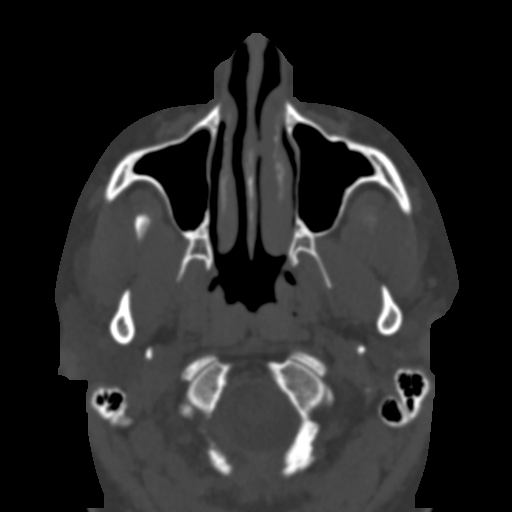
[im 33/107  bone]
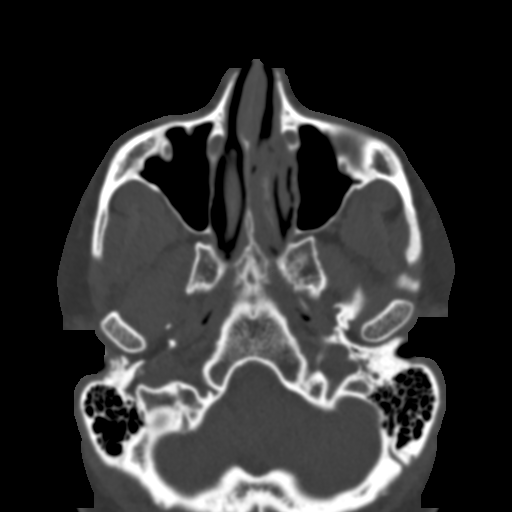
[im 48/107  bone]
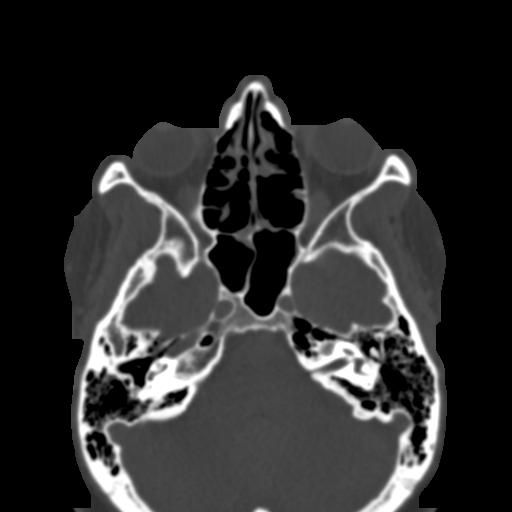
[im 59/107  brain]
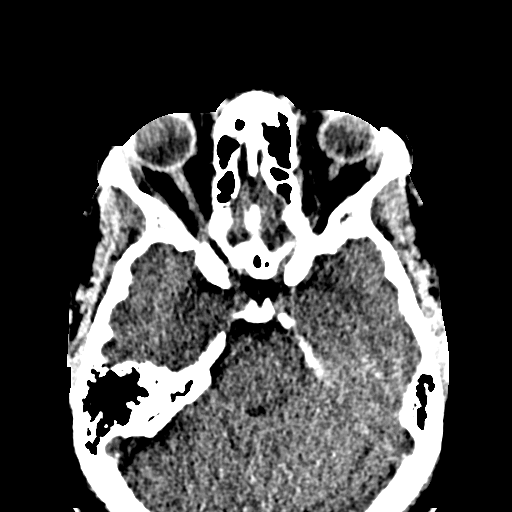
[im 59/107  bone]
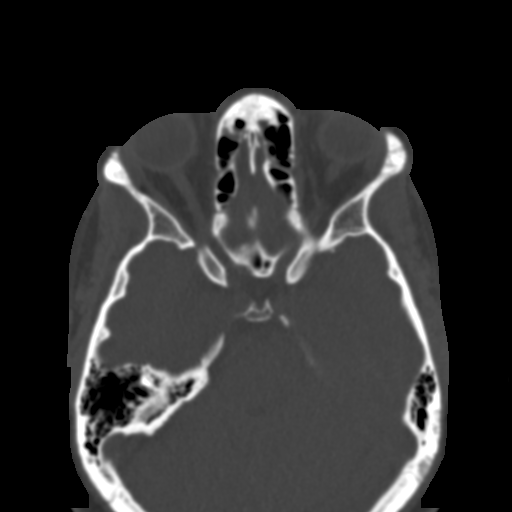
[im 74/107  bone]
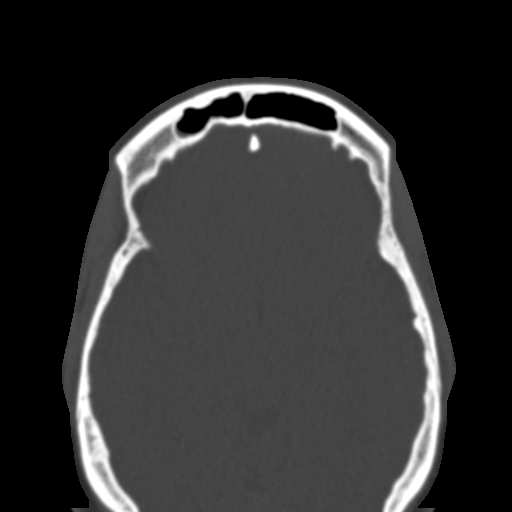
[im 85/107  bone]
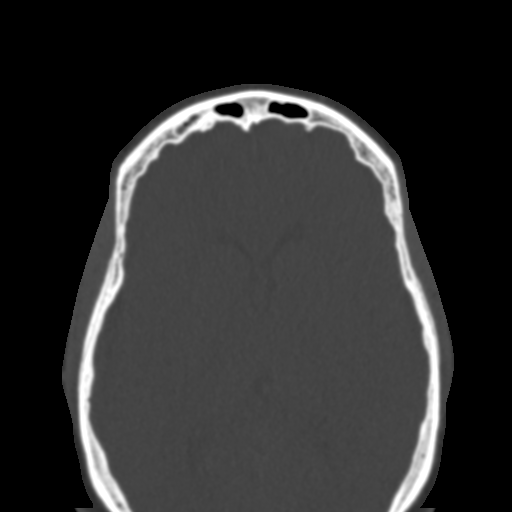
[im 99/107  bone]
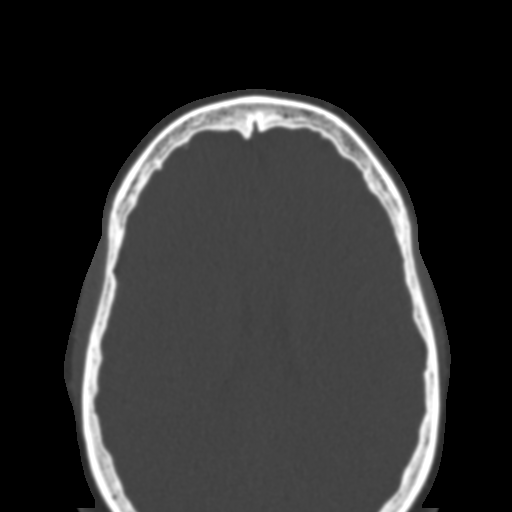

[Series 4: coronal · coronal · 0.22mm/px · 3 of 101 slices shown]
[im 34/101  bone]
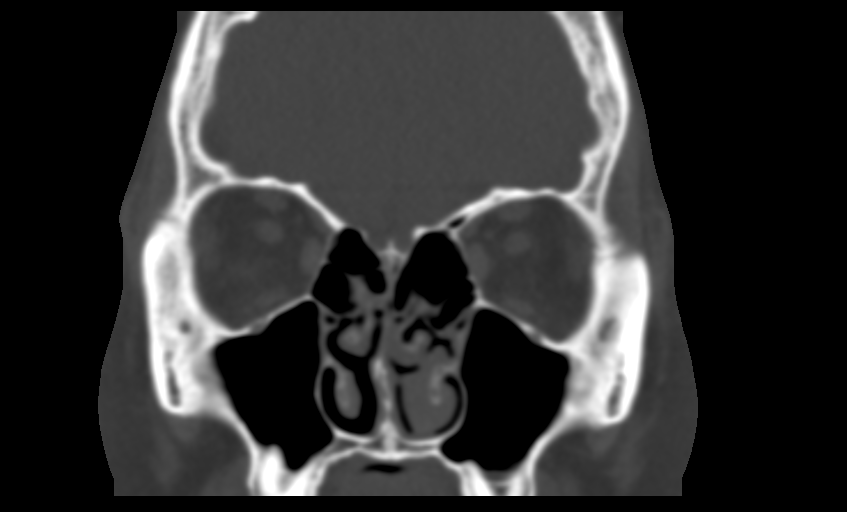
[im 45/101  bone]
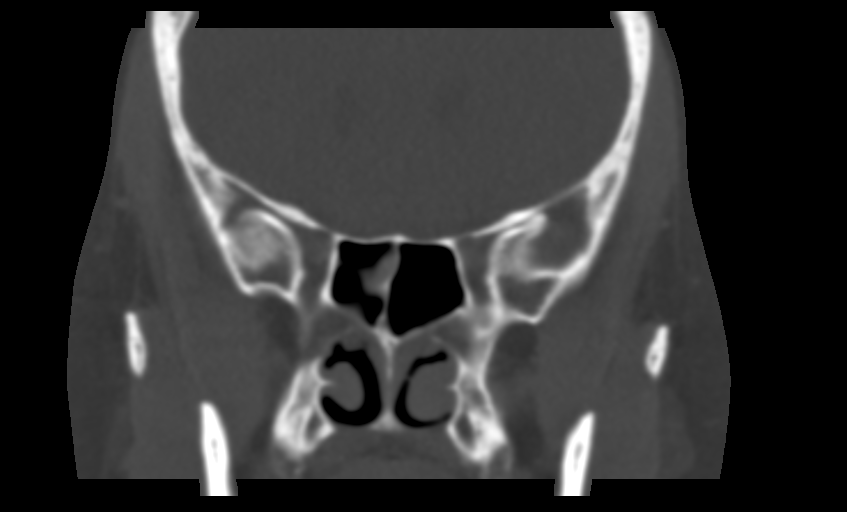
[im 56/101  bone]
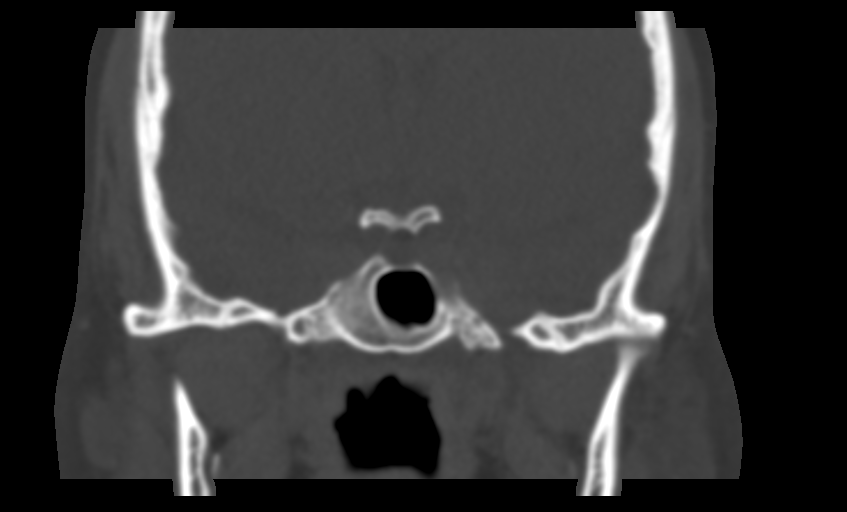

[Series 7: sag bone · sagittal · 0.23mm/px · 2 of 101 slices shown]
[im 34/101  bone]
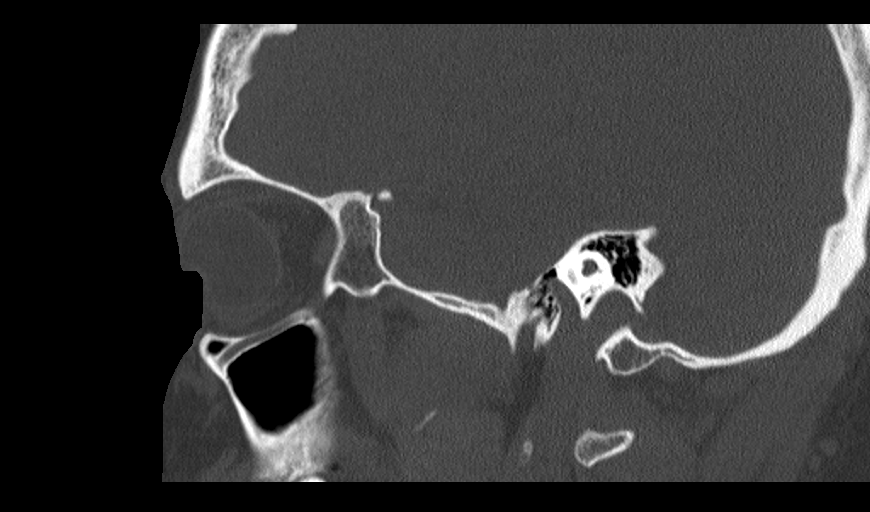
[im 67/101  bone]
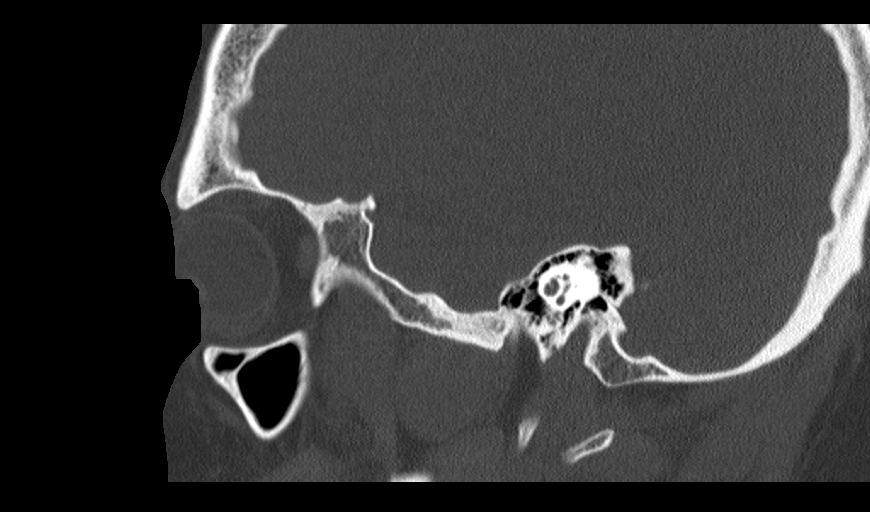

[13 of 47 positions shown; findings below may reference images not displayed]

FINDINGS: Paranasal sinuses:

Frontal: Mild mucosal thickening and fluid level in the right
frontal sinus, obliterating the corresponding frontal ethmoidal
recess. The frontal sinus is clear.

Ethmoid: Mucosal thickening in the bilateral ethmoid cells.

Maxillary: Minimal mucosal thickening in the bilateral maxillary
sinus with mucosal thickening noted at the level the infundibuli.

Sphenoid: Mucosal thickening at the level the sphenoid ethmoidal
recesses.

Right ostiomeatal unit: Obliterated by mucosal thickening.

Left ostiomeatal unit: Obliterated by mucosal thickening.

Nasal passages: Patent. Intact nasal septum is midline.

Anatomy: Pneumatization noted superior to anterior ethmoid notches.
Symmetric and intact olfactory grooves and fovea ethmoidalis, Keros
II (4-7mm). Sellar sphenoid pneumatization pattern. No dehiscence of
carotid or optic canals. No onodi cell.

Other: Orbits and intracranial compartment are unremarkable. Visible
mastoid air cells are normally aerated.
IMPRESSION: 1. Mild mucosal thickening and fluid level in the right frontal
sinus, obliterating the corresponding frontal ethmoidal recess.
2. Additional mild mucosal thickening as described.

## 2021-12-04 ENCOUNTER — Telehealth: Payer: Self-pay | Admitting: Adult Health

## 2021-12-04 ENCOUNTER — Other Ambulatory Visit (HOSPITAL_COMMUNITY): Payer: Self-pay

## 2021-12-04 ENCOUNTER — Other Ambulatory Visit: Payer: Self-pay | Admitting: Adult Health

## 2021-12-04 MED ORDER — MEDROXYPROGESTERONE ACETATE 150 MG/ML IM SUSY
150.0000 mg | PREFILLED_SYRINGE | INTRAMUSCULAR | 4 refills | Status: DC
  Filled 2021-12-04: qty 1, 90d supply, fill #0

## 2021-12-04 MED ORDER — WEGOVY 2.4 MG/0.75ML ~~LOC~~ SOAJ
2.4000 mg | SUBCUTANEOUS | 1 refills | Status: DC
  Filled 2021-12-04: qty 3, 28d supply, fill #0
  Filled 2021-12-29: qty 3, 28d supply, fill #1

## 2021-12-04 MED ORDER — MEDROXYPROGESTERONE ACETATE 150 MG/ML IM SUSY
150.0000 mg | PREFILLED_SYRINGE | INTRAMUSCULAR | 4 refills | Status: DC
  Filled 2021-12-04: qty 1, 90d supply, fill #0
  Filled 2022-01-26 – 2022-02-23 (×3): qty 1, 90d supply, fill #1
  Filled 2022-05-13: qty 1, 30d supply, fill #2
  Filled 2022-05-13: qty 1, 90d supply, fill #2

## 2021-12-04 NOTE — Telephone Encounter (Signed)
Patient needs her depo prescription transferred to [facility]. Please advise.

## 2021-12-04 NOTE — Addendum Note (Signed)
Addended by: Cyril Mourning A on: 12/04/2021 01:39 PM   Modules accepted: Orders

## 2021-12-04 NOTE — Telephone Encounter (Signed)
Pt aware that depo sent to [facility]

## 2021-12-09 ENCOUNTER — Ambulatory Visit (HOSPITAL_COMMUNITY): Payer: No Typology Code available for payment source | Admitting: Clinical

## 2021-12-09 ENCOUNTER — Encounter (HOSPITAL_COMMUNITY): Payer: Self-pay

## 2021-12-09 ENCOUNTER — Telehealth (HOSPITAL_COMMUNITY): Payer: Self-pay | Admitting: Clinical

## 2021-12-09 NOTE — Telephone Encounter (Signed)
the patient called in and cancelled this appointment

## 2021-12-14 ENCOUNTER — Encounter (HOSPITAL_COMMUNITY): Payer: Self-pay | Admitting: Psychiatry

## 2021-12-14 ENCOUNTER — Telehealth (INDEPENDENT_AMBULATORY_CARE_PROVIDER_SITE_OTHER): Payer: No Typology Code available for payment source | Admitting: Psychiatry

## 2021-12-14 ENCOUNTER — Other Ambulatory Visit (HOSPITAL_COMMUNITY): Payer: Self-pay

## 2021-12-14 DIAGNOSIS — F3481 Disruptive mood dysregulation disorder: Secondary | ICD-10-CM | POA: Diagnosis not present

## 2021-12-14 DIAGNOSIS — G4721 Circadian rhythm sleep disorder, delayed sleep phase type: Secondary | ICD-10-CM

## 2021-12-14 DIAGNOSIS — F4310 Post-traumatic stress disorder, unspecified: Secondary | ICD-10-CM | POA: Diagnosis not present

## 2021-12-14 DIAGNOSIS — F411 Generalized anxiety disorder: Secondary | ICD-10-CM | POA: Diagnosis not present

## 2021-12-14 MED ORDER — MIRTAZAPINE 15 MG PO TABS
7.5000 mg | ORAL_TABLET | Freq: Every day | ORAL | 0 refills | Status: DC
  Filled 2021-12-14: qty 30, 60d supply, fill #0
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
## 2021-12-14 NOTE — Patient Instructions (Addendum)
We discontinued trazodone today in favor of starting remeron at 7.5mg  (half a tablet) nightly. Take this 30-13min before bed. Also, reach out to your OB provider to re-trial an IUD as the progesterone only seems to be worsening your headaches and irritability/mood symptoms. Contact your insurer to see other psychotherapists that are in network.  Here is a DBT skills workbook you can begin to look through to gain more tools for when panic attacks happen: https://www.[place]ley.[email or web address]

## 2021-12-29 ENCOUNTER — Other Ambulatory Visit (HOSPITAL_COMMUNITY): Payer: Self-pay

## 2021-12-29 ENCOUNTER — Telehealth (INDEPENDENT_AMBULATORY_CARE_PROVIDER_SITE_OTHER): Payer: No Typology Code available for payment source | Admitting: Psychiatry

## 2021-12-29 ENCOUNTER — Encounter (HOSPITAL_COMMUNITY): Payer: Self-pay | Admitting: Psychiatry

## 2021-12-29 DIAGNOSIS — F4310 Post-traumatic stress disorder, unspecified: Secondary | ICD-10-CM

## 2021-12-29 DIAGNOSIS — F411 Generalized anxiety disorder: Secondary | ICD-10-CM | POA: Diagnosis not present

## 2021-12-29 DIAGNOSIS — F3481 Disruptive mood dysregulation disorder: Secondary | ICD-10-CM | POA: Diagnosis not present

## 2021-12-29 DIAGNOSIS — G4721 Circadian rhythm sleep disorder, delayed sleep phase type: Secondary | ICD-10-CM | POA: Diagnosis not present

## 2021-12-29 MED ORDER — MIRTAZAPINE 15 MG PO TABS
7.5000 mg | ORAL_TABLET | Freq: Every day | ORAL | 1 refills | Status: DC
  Filled 2021-12-29 – 2022-01-26 (×2): qty 30, 60d supply, fill #0
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
## 2021-12-29 NOTE — Patient Instructions (Signed)
We didn't make any changes to your medication today. Let your PCP know about the nausea from wegovy and discuss changing your birth control with your OB provider.

## 2022-01-04 ENCOUNTER — Other Ambulatory Visit (HOSPITAL_COMMUNITY): Payer: Self-pay

## 2022-01-20 ENCOUNTER — Telehealth: Payer: No Typology Code available for payment source | Admitting: Physician Assistant

## 2022-01-20 ENCOUNTER — Other Ambulatory Visit (HOSPITAL_COMMUNITY): Payer: Self-pay

## 2022-01-20 ENCOUNTER — Other Ambulatory Visit: Payer: Self-pay

## 2022-01-20 DIAGNOSIS — B9689 Other specified bacterial agents as the cause of diseases classified elsewhere: Secondary | ICD-10-CM

## 2022-01-20 DIAGNOSIS — J069 Acute upper respiratory infection, unspecified: Secondary | ICD-10-CM | POA: Diagnosis not present

## 2022-01-20 MED ORDER — FLUCONAZOLE 150 MG PO TABS
150.0000 mg | ORAL_TABLET | Freq: Once | ORAL | 0 refills | Status: AC
  Filled 2022-01-20 (×2): qty 1, 1d supply, fill #0

## 2022-01-20 MED ORDER — BENZONATATE 100 MG PO CAPS
100.0000 mg | ORAL_CAPSULE | Freq: Three times a day (TID) | ORAL | 0 refills | Status: DC | PRN
  Filled 2022-01-20 (×3): qty 30, 10d supply, fill #0

## 2022-01-20 MED ORDER — AZITHROMYCIN 250 MG PO TABS
ORAL_TABLET | ORAL | 0 refills | Status: AC
  Filled 2022-01-20 (×3): qty 6, 5d supply, fill #0

## 2022-01-20 NOTE — Progress Notes (Signed)

## 2022-01-20 NOTE — Addendum Note (Signed)
Addended by: Waldon Merl on: 01/20/2022 04:15 PM   Modules accepted: Orders

## 2022-01-20 NOTE — Progress Notes (Signed)
I have spent 5 minutes in review of e-visit questionnaire, review and updating patient chart, medical decision making and response to patient.   Palmer [facility] Cody Lashe Oliveira, PA-C    

## 2022-01-26 ENCOUNTER — Other Ambulatory Visit: Payer: Self-pay | Admitting: Family Medicine

## 2022-01-26 ENCOUNTER — Other Ambulatory Visit (HOSPITAL_COMMUNITY): Payer: Self-pay

## 2022-01-27 ENCOUNTER — Other Ambulatory Visit: Payer: Self-pay

## 2022-01-27 ENCOUNTER — Other Ambulatory Visit (HOSPITAL_COMMUNITY): Payer: Self-pay

## 2022-01-27 MED ORDER — WEGOVY 2.4 MG/0.75ML ~~LOC~~ SOAJ
2.4000 mg | SUBCUTANEOUS | 3 refills | Status: DC
  Filled 2022-01-27: qty 3, 28d supply, fill #0
  Filled 2022-02-22: qty 3, 28d supply, fill #1
  Filled 2022-02-23: qty 3, 28d supply, fill #0
  Filled 2022-05-13 (×2): qty 3, 28d supply, fill #1

## 2022-01-27 NOTE — Telephone Encounter (Signed)
Urgent Care Patient Requested Prescriptions  Pending Prescriptions Disp Refills   ipratropium-albuterol (DUONEB) 0.5-2.5 (3) MG/3ML SOLN 360 mL 0    Sig: Take 3 mLs by nebulization every 4 (four) hours as needed.     There is no refill protocol information for this order

## 2022-01-28 ENCOUNTER — Other Ambulatory Visit (HOSPITAL_COMMUNITY): Payer: Self-pay

## 2022-01-28 ENCOUNTER — Other Ambulatory Visit: Payer: Self-pay

## 2022-01-29 ENCOUNTER — Other Ambulatory Visit (HOSPITAL_COMMUNITY): Payer: Self-pay

## 2022-01-29 MED ORDER — ALBUTEROL SULFATE (2.5 MG/3ML) 0.083% IN NEBU
3.0000 mL | INHALATION_SOLUTION | Freq: Three times a day (TID) | RESPIRATORY_TRACT | 0 refills | Status: AC
  Filled 2022-01-29: qty 90, 10d supply, fill #0
  Filled 2022-01-29: qty 75, 9d supply, fill #0

## 2022-02-10 ENCOUNTER — Other Ambulatory Visit (HOSPITAL_COMMUNITY)
Admission: RE | Admit: 2022-02-10 | Discharge: 2022-02-10 | Disposition: A | Discharge status: Home / Self Care | Payer: Commercial Managed Care - PPO | Source: Ambulatory Visit | Attending: Obstetrics & Gynecology | Admitting: Obstetrics & Gynecology

## 2022-02-10 ENCOUNTER — Other Ambulatory Visit (HOSPITAL_COMMUNITY): Payer: Self-pay

## 2022-02-10 ENCOUNTER — Other Ambulatory Visit (INDEPENDENT_AMBULATORY_CARE_PROVIDER_SITE_OTHER): Payer: Commercial Managed Care - PPO

## 2022-02-10 DIAGNOSIS — Z113 Encounter for screening for infections with a predominantly sexual mode of transmission: Secondary | ICD-10-CM | POA: Insufficient documentation

## 2022-02-10 MED ORDER — TRIAMCINOLONE ACETONIDE 0.1 % EX CREA
TOPICAL_CREAM | Freq: Two times a day (BID) | CUTANEOUS | 2 refills | Status: DC
  Filled 2022-02-10: qty 60, 30d supply, fill #0
  Filled 2022-05-13 (×2): qty 60, 30d supply, fill #1

## 2022-02-10 NOTE — Progress Notes (Addendum)
   NURSE VISIT- VAGINITIS/STD/POC  SUBJECTIVE:  Katie Price is a 22 y.o. G2P0010 GYN patientfemale here for a vaginal swab for STD screen.  She reports the following symptoms: none for 0 days. Denies abnormal vaginal bleeding, significant pelvic pain, fever, or UTI symptoms.  OBJECTIVE:  There were no vitals taken for this visit.  Appears well, in no apparent distress  ASSESSMENT: Vaginal swab for STD screen  PLAN: Self-collected vaginal probe for Gonorrhea, Chlamydia, Trichomonas, Bacterial Vaginosis, Yeast sent to lab Treatment: to be determined once results are received Follow-up as needed if symptoms persist/worsen, or new symptoms develop  Janece Canterbury  02/10/2022 2:57 PM

## 2022-02-12 ENCOUNTER — Other Ambulatory Visit (HOSPITAL_COMMUNITY): Payer: Self-pay

## 2022-02-12 LAB — CERVICOVAGINAL ANCILLARY ONLY
Bacterial Vaginitis (gardnerella): NEGATIVE
Candida Glabrata: NEGATIVE
Candida Vaginitis: POSITIVE — AB
Chlamydia: NEGATIVE
Comment: NEGATIVE
Comment: NEGATIVE
Comment: NEGATIVE
Comment: NEGATIVE
Comment: NEGATIVE
Comment: NORMAL
Neisseria Gonorrhea: NEGATIVE
Trichomonas: NEGATIVE

## 2022-02-15 ENCOUNTER — Other Ambulatory Visit: Payer: Self-pay | Admitting: Adult Health

## 2022-02-15 ENCOUNTER — Other Ambulatory Visit (HOSPITAL_COMMUNITY): Payer: Self-pay

## 2022-02-15 LAB — MYCOPLASMA / UREAPLASMA CULTURE

## 2022-02-15 MED ORDER — FLUCONAZOLE 150 MG PO TABS: ORAL_TABLET | ORAL | 1 refills | Status: DC

## 2022-02-15 MED ORDER — FLUCONAZOLE 150 MG PO TABS
ORAL_TABLET | ORAL | 1 refills | Status: DC
  Filled 2022-02-15: qty 2, 3d supply, fill #0
  Filled 2022-02-22: qty 2, 3d supply, fill #1

## 2022-02-15 NOTE — Progress Notes (Signed)
Pt wants diflucan sent to [facility] not [facility]

## 2022-02-15 NOTE — Progress Notes (Signed)
+  yeast on vaginal swab, rx diflucan

## 2022-02-22 ENCOUNTER — Other Ambulatory Visit (HOSPITAL_COMMUNITY): Payer: Self-pay

## 2022-02-23 ENCOUNTER — Other Ambulatory Visit: Payer: Self-pay | Admitting: Adult Health

## 2022-02-23 ENCOUNTER — Other Ambulatory Visit (HOSPITAL_COMMUNITY): Payer: Self-pay

## 2022-02-23 MED ORDER — TERCONAZOLE 0.4 % VA CREA
1.0000 | TOPICAL_CREAM | Freq: Every day | VAGINAL | 0 refills | Status: DC
  Filled 2022-02-23: qty 45, 7d supply, fill #0

## 2022-02-23 NOTE — Progress Notes (Signed)
Rx terazol  

## 2022-02-24 ENCOUNTER — Other Ambulatory Visit: Payer: Self-pay

## 2022-02-25 ENCOUNTER — Other Ambulatory Visit (HOSPITAL_COMMUNITY): Payer: Self-pay

## 2022-02-27 ENCOUNTER — Other Ambulatory Visit (HOSPITAL_COMMUNITY): Payer: Self-pay

## 2022-03-16 ENCOUNTER — Other Ambulatory Visit (HOSPITAL_COMMUNITY): Payer: Self-pay

## 2022-03-25 ENCOUNTER — Other Ambulatory Visit (HOSPITAL_COMMUNITY): Payer: Self-pay

## 2022-05-13 ENCOUNTER — Other Ambulatory Visit (HOSPITAL_COMMUNITY): Payer: Self-pay

## 2022-05-18 ENCOUNTER — Other Ambulatory Visit (HOSPITAL_COMMUNITY): Payer: Self-pay

## 2022-05-21 ENCOUNTER — Other Ambulatory Visit (HOSPITAL_COMMUNITY): Payer: Self-pay

## 2022-05-24 ENCOUNTER — Other Ambulatory Visit (HOSPITAL_COMMUNITY): Payer: Self-pay

## 2022-05-26 ENCOUNTER — Other Ambulatory Visit (HOSPITAL_COMMUNITY): Payer: Self-pay

## 2022-05-27 ENCOUNTER — Other Ambulatory Visit (HOSPITAL_COMMUNITY): Payer: Self-pay

## 2022-06-17 ENCOUNTER — Other Ambulatory Visit (INDEPENDENT_AMBULATORY_CARE_PROVIDER_SITE_OTHER): Payer: BC Managed Care – PPO

## 2022-06-17 ENCOUNTER — Other Ambulatory Visit (HOSPITAL_COMMUNITY)
Admission: RE | Admit: 2022-06-17 | Discharge: 2022-06-17 | Disposition: A | Discharge status: Home / Self Care | Payer: BC Managed Care – PPO | Source: Ambulatory Visit | Attending: Obstetrics & Gynecology | Admitting: Obstetrics & Gynecology

## 2022-06-17 DIAGNOSIS — N898 Other specified noninflammatory disorders of vagina: Secondary | ICD-10-CM

## 2022-06-17 NOTE — Progress Notes (Signed)
   NURSE VISIT- VAGINITIS/STD/POC  SUBJECTIVE:  Katie Price is a 22 y.o. G2P0010 GYN patientfemale here for a vaginal swab for vaginitis screening.  She reports the following symptoms: increase in vaginal for several days Denies abnormal vaginal bleeding, significant pelvic pain, fever, or UTI symptoms.  OBJECTIVE:  There were no vitals taken for this visit.  Appears well, in no apparent distress  ASSESSMENT: Vaginal swab for vaginitis screening  PLAN: Self-collected vaginal probe for Gonorrhea, Chlamydia, Trichomonas, Bacterial Vaginosis, Yeast sent to lab Treatment: to be determined once results are received Follow-up as needed if symptoms persist/worsen, or new symptoms develop  Jobe Marker  06/17/2022 1:43 PM

## 2022-06-18 LAB — CERVICOVAGINAL ANCILLARY ONLY
Bacterial Vaginitis (gardnerella): NEGATIVE
Candida Glabrata: NEGATIVE
Candida Vaginitis: NEGATIVE
Chlamydia: NEGATIVE
Comment: NEGATIVE
Comment: NEGATIVE
Comment: NEGATIVE
Comment: NEGATIVE
Comment: NEGATIVE
Comment: NORMAL
Neisseria Gonorrhea: NEGATIVE
Trichomonas: NEGATIVE

## 2022-08-12 ENCOUNTER — Telehealth: Payer: BC Managed Care – PPO | Admitting: Physician Assistant

## 2022-08-12 ENCOUNTER — Encounter: Payer: BC Managed Care – PPO | Admitting: Physician Assistant

## 2022-08-12 DIAGNOSIS — J019 Acute sinusitis, unspecified: Secondary | ICD-10-CM

## 2022-08-12 DIAGNOSIS — B9689 Other specified bacterial agents as the cause of diseases classified elsewhere: Secondary | ICD-10-CM

## 2022-08-12 MED ORDER — AMOXICILLIN-POT CLAVULANATE 875-125 MG PO TABS: 1.0000 | ORAL_TABLET | Freq: Two times a day (BID) | ORAL | 0 refills | Status: DC

## 2022-08-12 NOTE — Progress Notes (Signed)
Error, questionnaire malfunctioning

## 2022-08-12 NOTE — Progress Notes (Signed)
E-Visit for Sinus Problems  We are sorry that you are not feeling well.  Here is how we plan to help!  Based on what you have shared with me it looks like you have sinusitis.  Sinusitis is inflammation and infection in the sinus cavities of the head.  Based on your presentation I believe you most likely have Acute Bacterial Sinusitis.  This is an infection caused by bacteria and is treated with antibiotics. I have prescribed Augmentin 875mg/125mg one tablet twice daily with food, for 7 days. You may use an oral decongestant such as Mucinex D or if you have glaucoma or high blood pressure use plain Mucinex. Saline nasal spray help and can safely be used as often as needed for congestion.  If you develop worsening sinus pain, fever or notice severe headache and vision changes, or if symptoms are not better after completion of antibiotic, please schedule an appointment with a health care provider.    Sinus infections are not as easily transmitted as other respiratory infection, however we still recommend that you avoid close contact with loved ones, especially the very young and elderly.  Remember to wash your hands thoroughly throughout the day as this is the number one way to prevent the spread of infection!  Home Care: Only take medications as instructed by your medical team. Complete the entire course of an antibiotic. Do not take these medications with alcohol. A steam or ultrasonic humidifier can help congestion.  You can place a towel over your head and breathe in the steam from hot water coming from a faucet. Avoid close contacts especially the very young and the elderly. Cover your mouth when you cough or sneeze. Always remember to wash your hands.  Get Help Right Away If: You develop worsening fever or sinus pain. You develop a severe head ache or visual changes. Your symptoms persist after you have completed your treatment plan.  Make sure you Understand these instructions. Will watch  your condition. Will get help right away if you are not doing well or get worse.  Thank you for choosing an e-visit.  Your e-visit answers were reviewed by a board certified advanced clinical practitioner to complete your personal care plan. Depending upon the condition, your plan could have included both over the counter or prescription medications.  Please review your pharmacy choice. Make sure the pharmacy is open so you can pick up prescription now. If there is a problem, you may contact your provider through MyChart messaging and have the prescription routed to another pharmacy.  Your safety is important to us. If you have drug allergies check your prescription carefully.   For the next 24 hours you can use MyChart to ask questions about today's visit, request a non-urgent call back, or ask for a work or school excuse. You will get an email in the next two days asking about your experience. I hope that your e-visit has been valuable and will speed your recovery.  I have spent 5 minutes in review of e-visit questionnaire, review and updating patient chart, medical decision making and response to patient.   Jennifer M Burnette, PA-C  

## 2022-08-12 NOTE — Telephone Encounter (Signed)
This encounter was created in error - please disregard.

## 2022-08-16 ENCOUNTER — Other Ambulatory Visit (HOSPITAL_COMMUNITY): Payer: Self-pay

## 2022-09-06 ENCOUNTER — Other Ambulatory Visit (HOSPITAL_COMMUNITY)
Admission: RE | Admit: 2022-09-06 | Discharge: 2022-09-06 | Disposition: A | Discharge status: Home / Self Care | Payer: 59 | Source: Ambulatory Visit | Attending: Adult Health | Admitting: Adult Health

## 2022-09-06 ENCOUNTER — Encounter: Payer: Self-pay | Admitting: Adult Health

## 2022-09-06 ENCOUNTER — Ambulatory Visit (INDEPENDENT_AMBULATORY_CARE_PROVIDER_SITE_OTHER): Payer: 59 | Admitting: Adult Health

## 2022-09-06 VITALS — BP 129/83 | HR 106 | Ht 68.0 in | Wt 284.0 lb

## 2022-09-06 DIAGNOSIS — F419 Anxiety disorder, unspecified: Secondary | ICD-10-CM | POA: Insufficient documentation

## 2022-09-06 DIAGNOSIS — Z1331 Encounter for screening for depression: Secondary | ICD-10-CM

## 2022-09-06 DIAGNOSIS — Z01419 Encounter for gynecological examination (general) (routine) without abnormal findings: Secondary | ICD-10-CM | POA: Diagnosis not present

## 2022-09-06 DIAGNOSIS — Z113 Encounter for screening for infections with a predominantly sexual mode of transmission: Secondary | ICD-10-CM | POA: Diagnosis present

## 2022-09-06 MED ORDER — SERTRALINE HCL 50 MG PO TABS: 50.0000 mg | ORAL_TABLET | Freq: Every day | ORAL | 3 refills | Status: DC

## 2022-09-06 NOTE — Progress Notes (Signed)
Patient ID: ARYONNA VERDERBER, female   DOB: 26-Nov-2000, 22 y.o.   MRN: [id number] History of Present Illness: Onyx is a 22 year old black female,single, G1P0010, in for a well woman gyn exam. She has been on depo, but is not going to take anymore, will use condoms.      Component Value Date/Time   DIAGPAP  07/22/2020 1151    - Negative for intraepithelial lesion or malignancy (NILM)   ADEQPAP  07/22/2020 1151    Satisfactory but limited for evaluation with partially obscuring blood;   ADEQPAP transformation zone component present. 07/22/2020 1151   ADEQPAP  07/22/2020 1151    Satisfactory but limited for evaluation with scant cellularity;   ADEQPAP transformation zone component present. 07/22/2020 1151    PCP is Dr Margo Aye.   Current Medications, Allergies, Past Medical History, Past Surgical History, Family History and Social History were reviewed in [name].     Review of Systems: Patient denies any headaches, hearing loss, fatigue, blurred vision, shortness of breath, chest pain, abdominal pain, problems with bowel movements, urination, or intercourse. No joint pain or mood swings.     Physical Exam:BP 129/83 (BP Location: Left Arm, Patient Position: Sitting, Cuff Size: Large)   Pulse (!) 106   Ht 5\' 8"  (1.727 m)   Wt 284 lb (128.8 kg)   BMI 43.18 kg/m   General:  Well developed, well nourished, no acute distress Skin:  Warm and dry Neck:  Midline trachea, normal thyroid, good ROM, no lymphadenopathy Lungs; Clear to auscultation bilaterally Breast:  No dominant palpable mass, retraction, or nipple discharge,has bilateral nipple rods Cardiovascular: Regular rate and rhythm Abdomen:  Soft, non tender, no hepatosplenomegaly Pelvic:  External genitalia is normal in appearance, no lesions.  The vagina is normal in appearance. Urethra has no lesions or masses. The cervix is smooth.  CV swab obtained. Uterus is felt to be normal size, shape, and contour.   No adnexal masses or tenderness noted.Bladder is non tender, no masses felt. Extremities/musculoskeletal:  No swelling or varicosities noted, no clubbing or cyanosis Psych:  No mood changes, alert and cooperative,seems happy AA is 1 Fall risk is low    09/06/2022    1:42 PM 10/20/2021    9:57 AM 09/04/2021   11:57 AM  Depression screen PHQ 2/9  Decreased Interest 0  1  Down, Depressed, Hopeless 0  1  PHQ - 2 Score 0  2  Altered sleeping 0  2  Tired, decreased energy 0  2  Change in appetite 2  0  Feeling bad or failure about yourself  0  1  Trouble concentrating 0  1  Moving slowly or fidgety/restless 0  0  Suicidal thoughts 0  0  PHQ-9 Score 2  8     Information is confidential and restricted. Go to Review Flowsheets to unlock data.       09/06/2022    1:43 PM 11/10/2021    1:18 PM 11/10/2021    1:17 PM 09/04/2021   11:57 AM  GAD 7 : Generalized Anxiety Score  Nervous, Anxious, on Edge 3   2  Control/stop worrying 3   0  Worry too much - different things 0   2  Trouble relaxing 1   1  Restless 0   1  Easily annoyed or irritable 3   2  Afraid - awful might happen 0   0  Total GAD 7 Score 10   8  Anxiety  Difficulty         Information is confidential and restricted. Go to Review Flowsheets to unlock data.      Upstream - 09/06/22 1342       Pregnancy Intention Screening   Does the patient want to become pregnant in the next year? No    Does the patient's partner want to become pregnant in the next year? No    Would the patient like to discuss contraceptive options today? No      Contraception Wrap Up   Current Method Hormonal Injection    End Method Female Condom    Contraception Counseling Provided Yes    How was the end contraceptive method provided? N/A            Examination chaperoned by Malachy Mood LPN  Impression and plan: 1. Encounter for well woman exam with routine gynecological exam Pap and physical in 1 year  2. Screening examination for STD  (sexually transmitted disease) CV swab sent for GC/CHL,trich,BV and yeast She declines HIV and RPR - Cervicovaginal ancillary only( [place])  3. Anxiety Will try zoloft 50 mg 1 daily Meds ordered this encounter  Medications   sertraline (ZOLOFT) 50 MG tablet    Sig: Take 1 tablet (50 mg total) by mouth daily.    Dispense:  30 tablet    Refill:  3    Order Specific Question:   Supervising Provider    Answer:   Duane Lope H [[id number]]     Follow up in 10 weeks for ROS

## 2022-11-15 ENCOUNTER — Ambulatory Visit: Payer: 59 | Admitting: Adult Health

## 2022-11-15 ENCOUNTER — Encounter: Payer: Self-pay | Admitting: Adult Health

## 2022-11-15 VITALS — Ht 68.0 in | Wt 285.0 lb

## 2022-11-15 DIAGNOSIS — F419 Anxiety disorder, unspecified: Secondary | ICD-10-CM | POA: Diagnosis not present

## 2022-11-15 DIAGNOSIS — F32A Depression, unspecified: Secondary | ICD-10-CM | POA: Diagnosis not present

## 2022-11-15 MED ORDER — BUSPIRONE HCL 5 MG PO TABS: 5.0000 mg | ORAL_TABLET | Freq: Three times a day (TID) | ORAL | 3 refills | Status: DC

## 2022-11-15 MED ORDER — SERTRALINE HCL 50 MG PO TABS: 50.0000 mg | ORAL_TABLET | Freq: Every day | ORAL | 3 refills | Status: DC

## 2022-11-15 NOTE — Progress Notes (Signed)
Patient ID: Katie Price, female   DOB: 05-09-2000, 22 y.o.   MRN: [id number]   TELEHEALTH GYNECOLOGY VISIT ENCOUNTER NOTE  Provider location: [facility]   Patient location: work  I connected with Katie Price on 11/15/22 at  4:10 PM EDT by telephone and verified that I am speaking with the correct person using two identifiers. Patient was unable to do MyChart audiovisual encounter due to technical difficulties, she tried several times.    I discussed the limitations, risks, security and privacy concerns of performing an evaluation and management service by telephone and the availability of in person appointments. I also discussed with the patient that there may be a patient responsible charge related to this service. The patient expressed understanding and agreed to proceed.   History:  Katie Price is a 22 y.o. G42P0010 female being evaluated today for anxiety and depression after starting zoloft in August and was doing well, then she got custody of 80 year old sister and is more stressed. She denies any  other concerns.       Past Medical History:  Diagnosis Date   Anxiety and depression 09/04/2021   Asthma    Bipolar 1 disorder (HCC) 04/10/2016   Bipolar 1 disorder, depressed, moderate (HCC) 05/14/2014   Bipolar disorder (HCC)    Encounter for gynecological examination with Papanicolaou smear of cervix 07/22/2020   Encounter for initial prescription of contraceptive pills 06/04/2020   Encounter for initial prescription of contraceptives 06/04/2020   Encounter for initial prescription of vaginal ring hormonal contraceptive 11/01/2019   Failed vision screen 04/12/2014   General counseling and advice on contraceptive management 11/10/2020   MDD (major depressive disorder) 09/12/2016   PE (pulmonary thromboembolism) (HCC)    Pregnancy examination or test, negative result 06/15/2017   Screening examination for STD (sexually transmitted disease) 06/15/2017    Tonsillar and adenoid hypertrophy 07/2016   snores during sleep, denies apnea   Past Surgical History:  Procedure Laterality Date   ETHMOIDECTOMY Right 03/31/2020   Procedure: RIGHT TOTAL ETHMOIDECTOMY AND FRONTAL RECESS exploration;  Surgeon: Newman Pies, MD;  Location: [facility];  Service: ENT;  Laterality: Right;   MAXILLARY ANTROSTOMY Bilateral 03/31/2020   Procedure: ENDOSCOPIC MAXILLARY ANTROSTOMY WITH TISSUE REMOVAL;  Surgeon: Newman Pies, MD;  Location: [facility];  Service: ENT;  Laterality: Bilateral;   SINUS ENDO WITH FUSION Bilateral 03/31/2020   Procedure: SINUS ENDOSCOPY WITH FUSION NAVIGATION;  Surgeon: Newman Pies, MD;  Location: [facility];  Service: ENT;  Laterality: Bilateral;   TONSILLECTOMY AND ADENOIDECTOMY N/A 08/02/2016   Procedure: TONSILLECTOMY AND ADENOIDECTOMY;  Surgeon: Newman Pies, MD;  Location: [facility];  Service: ENT;  Laterality: N/A;   TURBINATE REDUCTION Bilateral 03/31/2020   Procedure: TURBINATE REDUCTION;  Surgeon: Newman Pies, MD;  Location:  SURGERY [facility];  Service: ENT;  Laterality: Bilateral;   WISDOM TOOTH EXTRACTION     The following portions of the patient's history were reviewed and updated as appropriate: allergies, current medications, past family history, past medical history, past social history, past surgical history and problem list.   Health Maintenance:  Normal pap 07/22/20    Component Value Date/Time   DIAGPAP  07/22/2020 1151    - Negative for intraepithelial lesion or malignancy (NILM)   ADEQPAP  07/22/2020 1151    Satisfactory but limited for evaluation with partially obscuring blood;   ADEQPAP transformation zone component present. 07/22/2020 1151  ADEQPAP  07/22/2020 1151    Satisfactory but limited for evaluation with scant cellularity;   ADEQPAP transformation zone component present. 07/22/2020 1151    PCP is Dr Margo Aye.   Review of Systems:  Pertinent items noted in HPI  and remainder of comprehensive ROS otherwise negative.  Physical Exam:   General:  Alert, oriented and cooperative.   Mental Status: Normal mood and affect perceived. Normal judgment and thought content.  Physical exam deferred due to nature of the encounter Ht 5\' 8"  (1.727 m)   Wt 285 lb (129.3 kg)   BMI 43.33 kg/m      11/15/2022    4:22 PM 09/06/2022    1:42 PM 10/20/2021    9:57 AM  Depression screen PHQ 2/9  Decreased Interest 2 0   Down, Depressed, Hopeless 2 0   PHQ - 2 Score 4 0   Altered sleeping 1 0   Tired, decreased energy 3 0   Change in appetite 0 2   Feeling bad or failure about yourself  0 0   Trouble concentrating 3 0   Moving slowly or fidgety/restless 3 0   Suicidal thoughts 0 0   PHQ-9 Score 14 2      Information is confidential and restricted. Go to Review Flowsheets to unlock data.       11/15/2022    4:24 PM 09/06/2022    1:43 PM 11/10/2021    1:18 PM 11/10/2021    1:17 PM  GAD 7 : Generalized Anxiety Score  Nervous, Anxious, on Edge 3 3    Control/stop worrying 3 3    Worry too much - different things 3 0    Trouble relaxing 2 1    Restless 3 0    Easily annoyed or irritable 3 3    Afraid - awful might happen 1 0    Total GAD 7 Score 18 10    Anxiety Difficulty         Information is confidential and restricted. Go to Review Flowsheets to unlock data.      Upstream - 11/15/22 1622       Pregnancy Intention Screening   Does the patient want to become pregnant in the next year? No    Does the patient's partner want to become pregnant in the next year? No    Would the patient like to discuss contraceptive options today? No      Contraception Wrap Up   Current Method Abstinence    End Method Abstinence;Female Condom             Labs and Imaging No results found for this or any previous visit (from the past 336 hour(s)). No results found.    Assessment and Plan:     1. Anxiety and depression Will continue zoloft 50 mg 1 daily and  add Buspar 5 mg 1 tid Meds ordered this encounter  Medications   sertraline (ZOLOFT) 50 MG tablet    Sig: Take 1 tablet (50 mg total) by mouth daily.    Dispense:  30 tablet    Refill:  3    Order Specific Question:   Supervising Provider    Answer:   Despina Hidden, LUTHER H [[id number]]   busPIRone (BUSPAR) 5 MG tablet    Sig: Take 1 tablet (5 mg total) by mouth 3 (three) times daily.    Dispense:  90 tablet    Refill:  3    Order Specific Question:   Supervising Provider  Answer:   Duane Lope H [[id number]]   Declines counseling for now  Follow up in 6 weeks for ROS      I discussed the assessment and treatment plan with the patient. The patient was provided an opportunity to ask questions and all were answered. The patient agreed with the plan and demonstrated an understanding of the instructions.   The patient was advised to call back or seek an in-person evaluation/go to the ED if the symptoms worsen or if the condition fails to improve as anticipated.  I provided 10 minutes of non-face-to-face time during this encounter.   Cyril Mourning, NP [facility] [employer], [facility]

## 2022-12-27 ENCOUNTER — Ambulatory Visit: Payer: 59 | Admitting: Adult Health

## 2022-12-27 ENCOUNTER — Encounter: Payer: Self-pay | Admitting: Adult Health

## 2022-12-27 VITALS — Ht 68.0 in | Wt 280.0 lb

## 2022-12-27 DIAGNOSIS — F32A Depression, unspecified: Secondary | ICD-10-CM

## 2022-12-27 DIAGNOSIS — F419 Anxiety disorder, unspecified: Secondary | ICD-10-CM

## 2022-12-27 MED ORDER — SERTRALINE HCL 25 MG PO TABS
ORAL_TABLET | ORAL | 3 refills | Status: DC
Start: 2022-12-27 — End: 2023-02-07

## 2022-12-27 MED ORDER — ONDANSETRON HCL 4 MG PO TABS: 4.0000 mg | ORAL_TABLET | Freq: Three times a day (TID) | ORAL | 1 refills | Status: AC | PRN

## 2022-12-27 NOTE — Progress Notes (Addendum)
Patient ID: Katie Price, female   DOB: Jan 12, 2001, 22 y.o.   MRN: [id number]   TELEHEALTH GYNECOLOGY VISIT ENCOUNTER NOTE  Provider location: [facility]   Patient location: Home  I connected with Katie Price on 12/27/22 at  1:50 PM EST by telephone and verified that I am speaking with the correct person using two identifiers. Patient was unable to do MyChart audiovisual encounter due to technical difficulties, she tried several times.    I discussed the limitations, risks, security and privacy concerns of performing an evaluation and management service by telephone and the availability of in person appointments. I also discussed with the patient that there may be a patient responsible charge related to this service. The patient expressed understanding and agreed to proceed.   History:  Katie Price is a 22 y.o. G109P0010 female being evaluated today for anxiety and depression, doing about the same, has appointment to start therapy. She worries about her sister, who is having mental health issues. Has some nausea with buspar. She denies any SI or HI or other concerns.       Past Medical History:  Diagnosis Date   Anxiety and depression 09/04/2021   Asthma    Bipolar 1 disorder (HCC) 04/10/2016   Bipolar 1 disorder, depressed, moderate (HCC) 05/14/2014   Bipolar disorder (HCC)    Encounter for gynecological examination with Papanicolaou smear of cervix 07/22/2020   Encounter for initial prescription of contraceptive pills 06/04/2020   Encounter for initial prescription of contraceptives 06/04/2020   Encounter for initial prescription of vaginal ring hormonal contraceptive 11/01/2019   Failed vision screen 04/12/2014   General counseling and advice on contraceptive management 11/10/2020   MDD (major depressive disorder) 09/12/2016   PE (pulmonary thromboembolism) (HCC)    Pregnancy examination or test, negative result 06/15/2017   Screening examination for STD  (sexually transmitted disease) 06/15/2017   Tonsillar and adenoid hypertrophy 07/2016   snores during sleep, denies apnea   Past Surgical History:  Procedure Laterality Date   ETHMOIDECTOMY Right 03/31/2020   Procedure: RIGHT TOTAL ETHMOIDECTOMY AND FRONTAL RECESS exploration;  Surgeon: Newman Pies, MD;  Location: [facility];  Service: ENT;  Laterality: Right;   MAXILLARY ANTROSTOMY Bilateral 03/31/2020   Procedure: ENDOSCOPIC MAXILLARY ANTROSTOMY WITH TISSUE REMOVAL;  Surgeon: Newman Pies, MD;  Location: [facility];  Service: ENT;  Laterality: Bilateral;   SINUS ENDO WITH FUSION Bilateral 03/31/2020   Procedure: SINUS ENDOSCOPY WITH FUSION NAVIGATION;  Surgeon: Newman Pies, MD;  Location: [facility];  Service: ENT;  Laterality: Bilateral;   TONSILLECTOMY AND ADENOIDECTOMY N/A 08/02/2016   Procedure: TONSILLECTOMY AND ADENOIDECTOMY;  Surgeon: Newman Pies, MD;  Location: [facility];  Service: ENT;  Laterality: N/A;   TURBINATE REDUCTION Bilateral 03/31/2020   Procedure: TURBINATE REDUCTION;  Surgeon: Newman Pies, MD;  Location: [facility];  Service: ENT;  Laterality: Bilateral;   WISDOM TOOTH EXTRACTION     The following portions of the patient's history were reviewed and updated as appropriate: allergies, current medications, past family history, past medical history, past social history, past surgical history and problem list.   Health Maintenance:      Component Value Date/Time   DIAGPAP  07/22/2020 1151    - Negative for intraepithelial lesion or malignancy (NILM)   ADEQPAP  07/22/2020 1151    Satisfactory but limited for evaluation with partially obscuring blood;   ADEQPAP transformation zone component present.  07/22/2020 1151   ADEQPAP  07/22/2020 1151    Satisfactory but limited for evaluation with scant cellularity;   ADEQPAP transformation zone component present. 07/22/2020 1151     Review of Systems:  Pertinent items noted  in HPI and remainder of comprehensive ROS otherwise negative.  Physical Exam:   General:  Alert, oriented and cooperative.   Mental Status: Normal mood and affect perceived. Normal judgment and thought content.  Physical exam deferred due to nature of the encounter Ht 5\' 8"  (1.727 m)   Wt 280 lb (127 kg)   LMP 12/22/2022   BMI 42.57 kg/m   Fall risk is low    12/27/2022    2:13 PM 11/15/2022    4:22 PM 09/06/2022    1:42 PM  Depression screen PHQ 2/9  Decreased Interest 2 2 0  Down, Depressed, Hopeless 2 2 0  PHQ - 2 Score 4 4 0  Altered sleeping 2 1 0  Tired, decreased energy 2 3 0  Change in appetite 0 0 2  Feeling bad or failure about yourself  0 0 0  Trouble concentrating 3 3 0  Moving slowly or fidgety/restless 3 3 0  Suicidal thoughts 0 0 0  PHQ-9 Score 14 14 2        12/27/2022    2:15 PM 11/15/2022    4:24 PM 09/06/2022    1:43 PM 11/10/2021    1:18 PM  GAD 7 : Generalized Anxiety Score  Nervous, Anxious, on Edge 3 3 3    Control/stop worrying 3 3 3    Worry too much - different things 3 3 0   Trouble relaxing 2 2 1    Restless 2 3 0   Easily annoyed or irritable 3 3 3    Afraid - awful might happen 3 1 0   Total GAD 7 Score 19 18 10       Information is confidential and restricted. Go to Review Flowsheets to unlock data.      Upstream - 12/27/22 1413       Pregnancy Intention Screening   Does the patient want to become pregnant in the next year? No    Does the patient's partner want to become pregnant in the next year? No    Would the patient like to discuss contraceptive options today? No      Contraception Wrap Up   Current Method Female Condom    End Method Female Condom             Labs and Imaging No results found for this or any previous visit (from the past 336 hour(s)). No results found.    Assessment and Plan:     1. Anxiety and depression Continue buspar 5 mg 1 tid, has refills Will increase zoloft to 75 mg  Meds ordered this encounter   Medications   sertraline (ZOLOFT) 25 MG tablet    Sig: Take 3 po daily    Dispense:  90 tablet    Refill:  3    Order Specific Question:   Supervising Provider    Answer:   Despina Hidden, LUTHER H [[id number]]   ondansetron (ZOFRAN) 4 MG tablet    Sig: Take 1 tablet (4 mg total) by mouth every 8 (eight) hours as needed.    Dispense:  20 tablet    Refill:  1    Order Specific Question:   Supervising Provider    Answer:   Duane Lope H [[id number]]   Will rx zofran for nausea with  Buspar Keep therapy appt.  Follow up in 6 weeks for ROS       I discussed the assessment and treatment plan with the patient. The patient was provided an opportunity to ask questions and all were answered. The patient agreed with the plan and demonstrated an understanding of the instructions.   The patient was advised to call back or seek an in-person evaluation/go to the ED if the symptoms worsen or if the condition fails to improve as anticipated.  I provided 10 minutes of non-face-to-face time during this encounter.   Cyril Mourning, NP [facility] [employer], [facility]

## 2023-01-10 ENCOUNTER — Telehealth: Payer: 59 | Admitting: Nurse Practitioner

## 2023-01-10 DIAGNOSIS — B379 Candidiasis, unspecified: Secondary | ICD-10-CM

## 2023-01-10 DIAGNOSIS — T3695XA Adverse effect of unspecified systemic antibiotic, initial encounter: Secondary | ICD-10-CM | POA: Diagnosis not present

## 2023-01-10 MED ORDER — FLUCONAZOLE 150 MG PO TABS: 150.0000 mg | ORAL_TABLET | Freq: Once | ORAL | 0 refills | Status: AC

## 2023-01-10 NOTE — Progress Notes (Signed)

## 2023-02-07 ENCOUNTER — Ambulatory Visit: Payer: 59 | Admitting: Adult Health

## 2023-02-07 ENCOUNTER — Encounter: Payer: Self-pay | Admitting: Adult Health

## 2023-02-07 VITALS — Ht 69.0 in | Wt 281.0 lb

## 2023-02-07 DIAGNOSIS — F419 Anxiety disorder, unspecified: Secondary | ICD-10-CM | POA: Diagnosis not present

## 2023-02-07 DIAGNOSIS — F32A Depression, unspecified: Secondary | ICD-10-CM | POA: Diagnosis not present

## 2023-02-07 MED ORDER — BUPROPION HCL ER (XL) 150 MG PO TB24
150.0000 mg | ORAL_TABLET | Freq: Every day | ORAL | 3 refills | Status: DC
Start: 2023-02-07 — End: 2023-02-17

## 2023-02-07 MED ORDER — HYDROXYZINE PAMOATE 25 MG PO CAPS
25.0000 mg | ORAL_CAPSULE | Freq: Three times a day (TID) | ORAL | 0 refills | Status: DC | PRN
Start: 2023-02-07 — End: 2023-02-17

## 2023-02-07 NOTE — Progress Notes (Signed)
 Patient ID: Katie Price, female   DOB: 29-Dec-2000, 23 y.o.   MRN: [id number]   TELEHEALTH GYNECOLOGY VISIT ENCOUNTER NOTE  Provider location: [facility]   Patient location: Home  I connected with Katie Price on 02/07/23 at  1:50 PM EST by telephone and verified that I am speaking with the correct person using two identifiers. Patient was unable to do MyChart audiovisual encounter due to technical difficulties, she tried several times.    I discussed the limitations, risks, security and privacy concerns of performing an evaluation and management service by telephone and the availability of in person appointments. I also discussed with the patient that there may be a patient responsible charge related to this service. The patient expressed understanding and agreed to proceed.   History:  Katie Price is a 23 y.o. G37P0010 female being evaluated today for anxiety and depression, and she says she is not doing well on zoloft , having headaches and has been to therapy but wants referral to [facility], had panic attack and had to leave work. She denies any SI or HI or other concerns.       Past Medical History:  Diagnosis Date   Anxiety and depression 09/04/2021   Asthma    Bipolar 1 disorder (HCC) 04/10/2016   Bipolar 1 disorder, depressed, moderate (HCC) 05/14/2014   Bipolar disorder (HCC)    Encounter for gynecological examination with Papanicolaou smear of cervix 07/22/2020   Encounter for initial prescription of contraceptive pills 06/04/2020   Encounter for initial prescription of contraceptives 06/04/2020   Encounter for initial prescription of vaginal ring hormonal contraceptive 11/01/2019   Failed vision screen 04/12/2014   General counseling and advice on contraceptive management 11/10/2020   MDD (major depressive disorder) 09/12/2016   PE (pulmonary thromboembolism) (HCC)    Pregnancy examination or test, negative result 06/15/2017   Screening examination for STD  (sexually transmitted disease) 06/15/2017   Tonsillar and adenoid hypertrophy 07/2016   snores during sleep, denies apnea   Past Surgical History:  Procedure Laterality Date   ETHMOIDECTOMY Right 03/31/2020   Procedure: RIGHT TOTAL ETHMOIDECTOMY AND FRONTAL RECESS exploration;  Surgeon: Karis Clunes, MD;  Location: [facility];  Service: ENT;  Laterality: Right;   MAXILLARY ANTROSTOMY Bilateral 03/31/2020   Procedure: ENDOSCOPIC MAXILLARY ANTROSTOMY WITH TISSUE REMOVAL;  Surgeon: Karis Clunes, MD;  Location: [facility];  Service: ENT;  Laterality: Bilateral;   SINUS ENDO WITH FUSION Bilateral 03/31/2020   Procedure: SINUS ENDOSCOPY WITH FUSION NAVIGATION;  Surgeon: Karis Clunes, MD;  Location: [facility];  Service: ENT;  Laterality: Bilateral;   TONSILLECTOMY AND ADENOIDECTOMY N/A 08/02/2016   Procedure: TONSILLECTOMY AND ADENOIDECTOMY;  Surgeon: Karis Clunes, MD;  Location: [facility];  Service: ENT;  Laterality: N/A;   TURBINATE REDUCTION Bilateral 03/31/2020   Procedure: TURBINATE REDUCTION;  Surgeon: Karis Clunes, MD;  Location:  SUR[facility]ERY CENTER;  Service: ENT;  Laterality: Bilateral;   WISDOM TOOTH EXTRACTION     The following portions of the patient's history were reviewed and updated as appropriate: allergies, current medications, past family history, past medical history, past social history, past surgical history and problem list.   Health Maintenance:     Component Value Date/Time   DIAGPAP  07/22/2020 1151    - Negative for intraepithelial lesion or malignancy (NILM)   ADEQPAP  07/22/2020 1151    Satisfactory but limited for evaluation with partially obscuring blood;   ADEQPAP  transformation zone component present. 07/22/2020 1151   ADEQPAP  07/22/2020 1151    Satisfactory but limited for evaluation with scant cellularity;   ADEQPAP transformation zone component present. 07/22/2020 1151    PCP is Dr Shona   Review of Systems:   Pertinent items noted in HPI and remainder of comprehensive ROS otherwise negative.  Physical Exam:   General:  Alert, oriented and cooperative.   Mental Status: Normal mood and affect perceived. Normal judgment and thought content.  Physical exam deferred due to nature of the encounter Ht 5' 9 (1.753 m)   Wt 281 lb (127.5 kg)   LMP 01/22/2023 (Approximate)   BMI 41.50 kg/m      02/07/2023    2:06 PM 12/27/2022    2:13 PM 11/15/2022    4:22 PM  Depression screen PHQ 2/9  Decreased Interest 3 2 2   Down, Depressed, Hopeless 3 2 2   PHQ - 2 Score 6 4 4   Altered sleeping 3 2 1   Tired, decreased energy 2 2 3   Change in appetite 1 0 0  Feeling bad or failure about yourself  3 0 0  Trouble concentrating 3 3 3   Moving slowly or fidgety/restless 3 3 3   Suicidal thoughts 0 0 0  PHQ-9 Score 21 14 14        02/07/2023    2:08 PM 12/27/2022    2:15 PM 11/15/2022    4:24 PM 09/06/2022    1:43 PM  GAD 7 : Generalized Anxiety Score  Nervous, Anxious, on Edge 3 3 3 3   Control/stop worrying 3 3 3 3   Worry too much - different things 3 3 3  0  Trouble relaxing 3 2 2 1   Restless 3 2 3  0  Easily annoyed or irritable 3 3 3 3   Afraid - awful might happen 3 3 1  0  Total GAD 7 Score 21 19 18 10       Upstream - 02/07/23 1406       Pregnancy Intention Screening   Does the patient want to become pregnant in the next year? No    Does the patient's partner want to become pregnant in the next year? No    Would the patient like to discuss contraceptive options today? No      Contraception Wrap Up   Current Method Female Condom    End Method Female Condom             Labs and Imaging No results found for this or any previous visit (from the past 2 weeks). No results found.    Assessment and Plan:     1. Anxiety and depression (Primary) Will continue buspar  5 mg 1 tid Stop zoloft  and start Wellbutrin  150 mg  and will add vistaril  25 mg prn increased anxiety Meds ordered this encounter   Medications   buPROPion  (WELLBUTRIN  XL) 150 MG 24 hr tablet    Sig: Take 1 tablet (150 mg total) by mouth daily.    Dispense:  30 tablet    Refill:  3    Supervising Provider:   JAYNE VONN DEL [[id number]]   hydrOXYzine  (VISTARIL ) 25 MG capsule    Sig: Take 1 capsule (25 mg total) by mouth 3 (three) times daily as needed.    Dispense:  30 capsule    Refill:  0    Supervising Provider:   JAYNE VONN DEL [[id number]]   Referral placed for [facility], if not heard from them in 7-10 days let me know -  Ambulatory referral to Behavioral Health Follow up in 6 weeks for ROS       I discussed the assessment and treatment plan with the patient. The patient was provided an opportunity to ask questions and all were answered. The patient agreed with the plan and demonstrated an understanding of the instructions.   The patient was advised to call back or seek an in-person evaluation/go to the ED if the symptoms worsen or if the condition fails to improve as anticipated.  I provided 10  minutes of non-face-to-face time during this encounter.   Delon Lewis, NP [facility] [employer]

## 2023-02-07 NOTE — Progress Notes (Deleted)
 Patient ID: Katie Price, female   DOB: 29-Dec-2000, 23 y.o.   MRN: [id number]   TELEHEALTH GYNECOLOGY VISIT ENCOUNTER NOTE  Provider location: [facility]   Patient location: Home  I connected with [name] on 02/07/23 at  1:50 PM EST by telephone and verified that I am speaking with the correct person using two identifiers. Patient was unable to do MyChart audiovisual encounter due to technical difficulties, she tried several times.    I discussed the limitations, risks, security and privacy concerns of performing an evaluation and management service by telephone and the availability of in person appointments. I also discussed with the patient that there may be a patient responsible charge related to this service. The patient expressed understanding and agreed to proceed.   History:  Katie Price is a 23 y.o. G37P0010 female being evaluated today for anxiety and depression, and she says she is not doing well on zoloft , having headaches and has been to therapy but wants referral to [facility], had panic attack and had to leave work. She denies any SI or HI or other concerns.       Past Medical History:  Diagnosis Date   Anxiety and depression 09/04/2021   Asthma    Bipolar 1 disorder (HCC) 04/10/2016   Bipolar 1 disorder, depressed, moderate (HCC) 05/14/2014   Bipolar disorder (HCC)    Encounter for gynecological examination with Papanicolaou smear of cervix 07/22/2020   Encounter for initial prescription of contraceptive pills 06/04/2020   Encounter for initial prescription of contraceptives 06/04/2020   Encounter for initial prescription of vaginal ring hormonal contraceptive 11/01/2019   Failed vision screen 04/12/2014   General counseling and advice on contraceptive management 11/10/2020   MDD (major depressive disorder) 09/12/2016   PE (pulmonary thromboembolism) (HCC)    Pregnancy examination or test, negative result 06/15/2017   Screening examination for STD  (sexually transmitted disease) 06/15/2017   Tonsillar and adenoid hypertrophy 07/2016   snores during sleep, denies apnea   Past Surgical History:  Procedure Laterality Date   ETHMOIDECTOMY Right 03/31/2020   Procedure: RIGHT TOTAL ETHMOIDECTOMY AND FRONTAL RECESS exploration;  Surgeon: Karis Clunes, MD;  Location: [facility];  Service: ENT;  Laterality: Right;   MAXILLARY ANTROSTOMY Bilateral 03/31/2020   Procedure: ENDOSCOPIC MAXILLARY ANTROSTOMY WITH TISSUE REMOVAL;  Surgeon: Karis Clunes, MD;  Location: [facility];  Service: ENT;  Laterality: Bilateral;   SINUS ENDO WITH FUSION Bilateral 03/31/2020   Procedure: SINUS ENDOSCOPY WITH FUSION NAVIGATION;  Surgeon: Karis Clunes, MD;  Location: [facility];  Service: ENT;  Laterality: Bilateral;   TONSILLECTOMY AND ADENOIDECTOMY N/A 08/02/2016   Procedure: TONSILLECTOMY AND ADENOIDECTOMY;  Surgeon: Karis Clunes, MD;  Location: [facility];  Service: ENT;  Laterality: N/A;   TURBINATE REDUCTION Bilateral 03/31/2020   Procedure: TURBINATE REDUCTION;  Surgeon: Karis Clunes, MD;  Location:  SURGERY CENTER;  Service: ENT;  Laterality: Bilateral;   WISDOM TOOTH EXTRACTION     The following portions of the patient's history were reviewed and updated as appropriate: allergies, current medications, past family history, past medical history, past social history, past surgical history and problem list.   Health Maintenance:     Component Value Date/Time   DIAGPAP  07/22/2020 1151    - Negative for intraepithelial lesion or malignancy (NILM)   ADEQPAP  07/22/2020 1151    Satisfactory but limited for evaluation with partially obscuring blood;   ADEQPAP  transformation zone component present. 07/22/2020 1151   ADEQPAP  07/22/2020 1151    Satisfactory but limited for evaluation with scant cellularity;   ADEQPAP transformation zone component present. 07/22/2020 1151    PCP is Dr Shona   Review of Systems:   Pertinent items noted in HPI and remainder of comprehensive ROS otherwise negative.  Physical Exam:   General:  Alert, oriented and cooperative.   Mental Status: Normal mood and affect perceived. Normal judgment and thought content.  Physical exam deferred due to nature of the encounter Ht 5' 9 (1.753 m)   Wt 281 lb (127.5 kg)   LMP 01/22/2023 (Approximate)   BMI 41.50 kg/m      02/07/2023    2:06 PM 12/27/2022    2:13 PM 11/15/2022    4:22 PM  Depression screen PHQ 2/9  Decreased Interest 3 2 2   Down, Depressed, Hopeless 3 2 2   PHQ - 2 Score 6 4 4   Altered sleeping 3 2 1   Tired, decreased energy 2 2 3   Change in appetite 1 0 0  Feeling bad or failure about yourself  3 0 0  Trouble concentrating 3 3 3   Moving slowly or fidgety/restless 3 3 3   Suicidal thoughts 0 0 0  PHQ-9 Score 21 14 14        02/07/2023    2:08 PM 12/27/2022    2:15 PM 11/15/2022    4:24 PM 09/06/2022    1:43 PM  GAD 7 : Generalized Anxiety Score  Nervous, Anxious, on Edge 3 3 3 3   Control/stop worrying 3 3 3 3   Worry too much - different things 3 3 3  0  Trouble relaxing 3 2 2 1   Restless 3 2 3  0  Easily annoyed or irritable 3 3 3 3   Afraid - awful might happen 3 3 1  0  Total GAD 7 Score 21 19 18 10       Upstream - 02/07/23 1406       Pregnancy Intention Screening   Does the patient want to become pregnant in the next year? No    Does the patient's partner want to become pregnant in the next year? No    Would the patient like to discuss contraceptive options today? No      Contraception Wrap Up   Current Method Female Condom    End Method Female Condom             Labs and Imaging No results found for this or any previous visit (from the past 2 weeks). No results found.    Assessment and Plan:     1. Anxiety and depression (Primary) Will continue buspar  5 mg 1 tid Stop zoloft  and start Wellbutrin  150 mg  and will add vistaril  25 mg prn increased anxiety Meds ordered this encounter   Medications   buPROPion  (WELLBUTRIN  XL) 150 MG 24 hr tablet    Sig: Take 1 tablet (150 mg total) by mouth daily.    Dispense:  30 tablet    Refill:  3    Supervising Provider:   JAYNE VONN DEL [[id number]]   hydrOXYzine  (VISTARIL ) 25 MG capsule    Sig: Take 1 capsule (25 mg total) by mouth 3 (three) times daily as needed.    Dispense:  30 capsule    Refill:  0    Supervising Provider:   JAYNE VONN DEL [[id number]]   Referral placed for [facility], if not heard from them in 7-10 days let me know -  Ambulatory referral to Behavioral Health Follow up in 6 weeks for ROS       I discussed the assessment and treatment plan with the patient. The patient was provided an opportunity to ask questions and all were answered. The patient agreed with the plan and demonstrated an understanding of the instructions.   The patient was advised to call back or seek an in-person evaluation/go to the ED if the symptoms worsen or if the condition fails to improve as anticipated.  I provided 10  minutes of non-face-to-face time during this encounter.   Delon Lewis, NP [facility] [employer], [facility]

## 2023-02-10 ENCOUNTER — Telehealth: Payer: 59 | Admitting: Physician Assistant

## 2023-02-10 DIAGNOSIS — B379 Candidiasis, unspecified: Secondary | ICD-10-CM | POA: Diagnosis not present

## 2023-02-10 DIAGNOSIS — T3695XA Adverse effect of unspecified systemic antibiotic, initial encounter: Secondary | ICD-10-CM | POA: Diagnosis not present

## 2023-02-10 MED ORDER — FLUCONAZOLE 150 MG PO TABS: 150.0000 mg | ORAL_TABLET | ORAL | 0 refills | Status: DC | PRN

## 2023-02-10 NOTE — Progress Notes (Signed)

## 2023-02-17 ENCOUNTER — Encounter (HOSPITAL_COMMUNITY): Payer: Self-pay | Admitting: Registered Nurse

## 2023-02-17 ENCOUNTER — Ambulatory Visit (INDEPENDENT_AMBULATORY_CARE_PROVIDER_SITE_OTHER): Payer: PRIVATE HEALTH INSURANCE | Admitting: Registered Nurse

## 2023-02-17 DIAGNOSIS — F411 Generalized anxiety disorder: Secondary | ICD-10-CM | POA: Diagnosis not present

## 2023-02-17 DIAGNOSIS — F331 Major depressive disorder, recurrent, moderate: Secondary | ICD-10-CM | POA: Diagnosis not present

## 2023-02-17 MED ORDER — HYDROXYZINE PAMOATE 25 MG PO CAPS: 25.0000 mg | ORAL_CAPSULE | Freq: Three times a day (TID) | ORAL | 1 refills | Status: DC

## 2023-02-17 MED ORDER — BUSPIRONE HCL 7.5 MG PO TABS: 7.5000 mg | ORAL_TABLET | Freq: Three times a day (TID) | ORAL | 1 refills | Status: DC

## 2023-02-17 MED ORDER — BUPROPION HCL ER (XL) 150 MG PO TB24: 150.0000 mg | ORAL_TABLET | Freq: Every day | ORAL | 3 refills | Status: DC

## 2023-02-17 NOTE — Patient Instructions (Signed)
Online search at [employer].  Enter the type of therapist looking for and the area you are in and should pull up therapist in your area.    Call 911, mobile crisis, or present to the nearest emergency room should you experience any suicidal/homicidal ideation, auditory/visual/hallucinations, or detrimental worsening of your mental health.  Mobile Crisis Response Teams Listed by counties in vicinity of [facility] providers [facility] [phone] [facility] [phone] [facility] [phone] [facility] [phone] [place]                * [facility] [phone]                * [employer] [phone]  [facility] [employer] [phone] [facility] [employer]  [phone] * [employer] [phone]
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
## 2023-02-22 NOTE — Progress Notes (Signed)
PT AWARE

## 2023-03-03 ENCOUNTER — Telehealth (HOSPITAL_COMMUNITY): Payer: PRIVATE HEALTH INSURANCE | Admitting: Registered Nurse

## 2023-03-13 ENCOUNTER — Telehealth: Payer: PRIVATE HEALTH INSURANCE | Admitting: Family Medicine

## 2023-03-13 DIAGNOSIS — T3695XA Adverse effect of unspecified systemic antibiotic, initial encounter: Secondary | ICD-10-CM

## 2023-03-13 DIAGNOSIS — B3731 Acute candidiasis of vulva and vagina: Secondary | ICD-10-CM | POA: Diagnosis not present

## 2023-03-13 DIAGNOSIS — B379 Candidiasis, unspecified: Secondary | ICD-10-CM | POA: Diagnosis not present

## 2023-03-13 MED ORDER — FLUCONAZOLE 150 MG PO TABS: 150.0000 mg | ORAL_TABLET | ORAL | 0 refills | Status: DC | PRN

## 2023-03-13 NOTE — Progress Notes (Signed)

## 2023-03-14 ENCOUNTER — Telehealth: Payer: PRIVATE HEALTH INSURANCE | Admitting: Physician Assistant

## 2023-03-14 DIAGNOSIS — J111 Influenza due to unidentified influenza virus with other respiratory manifestations: Secondary | ICD-10-CM

## 2023-03-14 MED ORDER — OSELTAMIVIR PHOSPHATE 75 MG PO CAPS: 75.0000 mg | ORAL_CAPSULE | Freq: Two times a day (BID) | ORAL | 0 refills | Status: AC

## 2023-03-14 MED ORDER — METHYLPREDNISOLONE 4 MG PO TBPK: ORAL_TABLET | ORAL | 0 refills | Status: DC

## 2023-03-14 MED ORDER — BENZONATATE 100 MG PO CAPS: 100.0000 mg | ORAL_CAPSULE | Freq: Two times a day (BID) | ORAL | 0 refills | Status: DC | PRN

## 2023-03-14 NOTE — Progress Notes (Signed)
E visit for Flu like symptoms   We are sorry that you are not feeling well.  Here is how we plan to help! Based on what you have shared with me it looks like you may have possible exposure to a virus that causes influenza.  Influenza or "the flu" is   an infection caused by a respiratory virus. The flu virus is highly contagious and persons who did not receive their yearly flu vaccination may "catch" the flu from close contact.  We have anti-viral medications to treat the viruses that cause this infection. They are not a "cure" and only shorten the course of the infection. These prescriptions are most effective when they are given within the first 2 days of "flu" symptoms. Antiviral medication are indicated if you have a high risk of complications from the flu. You should  also consider an antiviral medication if you are in close contact with someone who is at risk. These medications can help patients avoid complications from the flu  but have side effects that you should know. Possible side effects from Tamiflu or oseltamivir include nausea, vomiting, diarrhea, dizziness, headaches, eye redness, sleep problems or other respiratory symptoms. You should not take Tamiflu if you have an allergy to oseltamivir or any to the ingredients in Tamiflu.  Based upon your symptoms and potential risk factors I have prescribed Oseltamivir (Tamiflu).  It has been sent to your designated pharmacy.  You will take one 75 mg capsule orally twice a day for the next 5 days. and I recommend that you follow the flu symptoms recommendation that I have listed below.  ANYONE WHO HAS FLU SYMPTOMS SHOULD: Stay home. The flu is highly contagious and going out or to work exposes others! Be sure to drink plenty of fluids. Water is fine as well as fruit juices, sodas and electrolyte beverages. You may want to stay away from caffeine or alcohol. If you are nauseated, try taking small sips of liquids. How do you know if you are getting  enough fluid? Your urine should be a pale yellow or almost colorless. Get rest. Taking a steamy shower or using a humidifier may help nasal congestion and ease sore throat pain. Using a saline nasal spray works much the same way. Cough drops, hard candies and sore throat lozenges may ease your cough. Line up a caregiver. Have someone check on you regularly.   GET HELP RIGHT AWAY IF: You cannot keep down liquids or your medications. You become short of breath Your fell like you are going to pass out or loose consciousness. Your symptoms persist after you have completed your treatment plan MAKE SURE YOU  Understand these instructions. Will watch your condition. Will get help right away if you are not doing well or get worse.  Your e-visit answers were reviewed by a board certified advanced clinical practitioner to complete your personal care plan.  Depending on the condition, your plan could have included both over the counter or prescription medications.  If there is a problem please reply  once you have received a response from your provider.  Your safety is important to [place].  If you have drug allergies check your prescription carefully.    You can use MyChart to ask questions about today's visit, request a non-urgent call back, or ask for a work or school excuse for 24 hours related to this e-Visit. If it has been greater than 24 hours you will need to follow up with your provider, or enter  a new e-Visit to address those concerns.  You will get an e-mail in the next two days asking about your experience.  I hope that your e-visit has been valuable and will speed your recovery. Thank you for using e-visits.

## 2023-03-14 NOTE — Progress Notes (Signed)
 I have spent 5 minutes in review of e-visit questionnaire, review and updating patient chart, medical decision making and response to patient.   Laure Kidney, PA-C

## 2023-03-21 ENCOUNTER — Ambulatory Visit: Payer: PRIVATE HEALTH INSURANCE | Admitting: Adult Health

## 2023-03-28 ENCOUNTER — Ambulatory Visit: Payer: PRIVATE HEALTH INSURANCE | Admitting: Adult Health

## 2023-03-28 ENCOUNTER — Encounter: Payer: Self-pay | Admitting: Adult Health

## 2023-03-28 VITALS — BP 136/89 | HR 97 | Ht 68.0 in | Wt 304.0 lb

## 2023-03-28 DIAGNOSIS — I10 Essential (primary) hypertension: Secondary | ICD-10-CM

## 2023-03-28 DIAGNOSIS — Z30013 Encounter for initial prescription of injectable contraceptive: Secondary | ICD-10-CM

## 2023-03-28 DIAGNOSIS — Z3202 Encounter for pregnancy test, result negative: Secondary | ICD-10-CM | POA: Diagnosis not present

## 2023-03-28 LAB — POCT URINE PREGNANCY: Preg Test, Ur: NEGATIVE

## 2023-03-28 MED ORDER — HYDROCHLOROTHIAZIDE 12.5 MG PO CAPS: 12.5000 mg | ORAL_CAPSULE | Freq: Every day | ORAL | 6 refills | Status: DC

## 2023-03-28 MED ORDER — MEDROXYPROGESTERONE ACETATE 150 MG/ML IM SUSY
150.0000 mg | PREFILLED_SYRINGE | Freq: Once | INTRAMUSCULAR | Status: AC
  Administered 2023-03-28: 150 mg via INTRAMUSCULAR

## 2023-03-28 NOTE — Progress Notes (Signed)
  Subjective:     Patient ID: Katie Price, female   DOB: 2000-08-14, 23 y.o.   MRN: [id number]  HPI Katie Price is a 23 year old black female,single, G1P0010, in to discuss birth control and she wants depo today, has had in the past. She is taking with PCP about GLP1 for weight loss.      Component Value Date/Time   DIAGPAP  07/22/2020 1151    - Negative for intraepithelial lesion or malignancy (NILM)   ADEQPAP  07/22/2020 1151    Satisfactory but limited for evaluation with partially obscuring blood;   ADEQPAP transformation zone component present. 07/22/2020 1151   ADEQPAP  07/22/2020 1151    Satisfactory but limited for evaluation with scant cellularity;   ADEQPAP transformation zone component present. 07/22/2020 1151   PCP is Tresa Res NP at [facility]  Review of Systems Denies any headaches or chest pain Periods regular Reviewed past medical,surgical, social and family history. Reviewed medications and allergies.     Objective:   Physical Exam BP 136/89 (BP Location: Right Wrist, Patient Position: Sitting, Cuff Size: Normal)   Pulse 97   Ht 5\' 8"  (1.727 m)   Wt (!) 304 lb (137.9 kg)   LMP 03/22/2023 (Exact Date)   BMI 46.22 kg/m  UPT is negative Skin warm and dry.  Lungs: clear to ausculation bilaterally. Cardiovascular: regular rate and rhythm.      Upstream - 03/28/23 1139       Pregnancy Intention Screening   Does the patient want to become pregnant in the next year? No    Does the patient's partner want to become pregnant in the next year? No    Would the patient like to discuss contraceptive options today? Yes      Contraception Wrap Up   Current Method Female Condom    End Method Hormonal Injection;Female Condom    Contraception Counseling Provided Yes    How was the end contraceptive method provided? Provided on site   to get depo now in office and then talk tubal ligation in 4 weeks            Assessment:     1. Pregnancy examination or test, negative  result - POCT urine pregnancy  2. Hypertension, unspecified type Did not get inderal 80 mg prescribed at Urgent Care Will rx Microzide 12.5 mg 1 daily  Meds ordered this encounter  Medications   hydrochlorothiazide (MICROZIDE) 12.5 MG capsule    Sig: Take 1 capsule (12.5 mg total) by mouth daily.    Dispense:  30 capsule    Refill:  6    Supervising Provider:   Duane Lope H [[id number]]   medroxyPROGESTERone Acetate SUSY 150 mg    Return in 8 weeks to check BP and get pap.  3. Encounter for initial prescription of injectable contraceptive (Primary) Discussed IUD and depo and she wants depo today She does not want children, will make appt with Dr Charlotta Newton to duscuss  Depo given in office, she wants to discuss getting tubal ligation, does not want children she says - medroxyPROGESTERone Acetate SUSY 150 mg     Plan:     Return in 4 weeks to discuss tubal ligation with Dr Charlotta Newton

## 2023-04-06 ENCOUNTER — Other Ambulatory Visit: Payer: Self-pay | Admitting: Adult Health

## 2023-04-06 ENCOUNTER — Other Ambulatory Visit (HOSPITAL_COMMUNITY)
Admission: RE | Admit: 2023-04-06 | Discharge: 2023-04-06 | Disposition: A | Discharge status: Home / Self Care | Payer: PRIVATE HEALTH INSURANCE | Source: Ambulatory Visit | Attending: Obstetrics & Gynecology | Admitting: Obstetrics & Gynecology

## 2023-04-06 ENCOUNTER — Ambulatory Visit: Payer: PRIVATE HEALTH INSURANCE | Admitting: *Deleted

## 2023-04-06 DIAGNOSIS — N898 Other specified noninflammatory disorders of vagina: Secondary | ICD-10-CM

## 2023-04-06 DIAGNOSIS — R300 Dysuria: Secondary | ICD-10-CM | POA: Diagnosis not present

## 2023-04-06 LAB — POCT URINALYSIS DIPSTICK OB
Blood, UA: NEGATIVE
Glucose, UA: NEGATIVE
Ketones, UA: NEGATIVE
Leukocytes, UA: NEGATIVE
Nitrite, UA: NEGATIVE
POC,PROTEIN,UA: NEGATIVE

## 2023-04-06 MED ORDER — FLUCONAZOLE 150 MG PO TABS: ORAL_TABLET | ORAL | 1 refills | Status: DC

## 2023-04-06 NOTE — Progress Notes (Signed)
   NURSE VISIT- VAGINITIS/STD/POC  SUBJECTIVE:  Katie Price is a 23 y.o. G1P0010 GYN patientfemale here for a vaginal swab for vaginitis screening.  She reports the following symptoms: discharge described as white for several days. Denies abnormal vaginal bleeding, significant pelvic pain, fever, or UTI symptoms.  OBJECTIVE:  LMP 03/22/2023 (Exact Date)   Appears well, in no apparent distress  ASSESSMENT: Vaginal swab for vaginitis screening Also collected urine sample and sent for culture and urinalysis for dysuria.  PLAN: Self-collected vaginal probe for Gonorrhea, Chlamydia, Trichomonas, Bacterial Vaginosis, Yeast sent to lab Treatment: to be determined once results are received Follow-up as needed if symptoms persist/worsen, or new symptoms develop  Pt states that she is sure she has yeast would like diflucan sent in now for itching and discharge. Advised to check with pharmacy later today.   Annamarie Dawley  04/06/2023 11:09 AM

## 2023-04-06 NOTE — Progress Notes (Signed)
 Rx diflucan.

## 2023-04-07 ENCOUNTER — Other Ambulatory Visit: Payer: Self-pay | Admitting: Adult Health

## 2023-04-07 DIAGNOSIS — A599 Trichomoniasis, unspecified: Secondary | ICD-10-CM | POA: Insufficient documentation

## 2023-04-07 LAB — CERVICOVAGINAL ANCILLARY ONLY
Bacterial Vaginitis (gardnerella): POSITIVE — AB
Candida Glabrata: NEGATIVE
Candida Vaginitis: NEGATIVE
Chlamydia: NEGATIVE
Comment: NEGATIVE
Comment: NEGATIVE
Comment: NEGATIVE
Comment: NEGATIVE
Comment: NEGATIVE
Comment: NORMAL
Neisseria Gonorrhea: NEGATIVE
Trichomonas: POSITIVE — AB

## 2023-04-07 LAB — URINALYSIS
Bilirubin, UA: NEGATIVE
Glucose, UA: NEGATIVE
Ketones, UA: NEGATIVE
Nitrite, UA: NEGATIVE
RBC, UA: NEGATIVE
Specific Gravity, UA: >=1.03 — AB (ref 1.005–1.030)
Urobilinogen, Ur: 0.2 mg/dL (ref 0.2–1.0)
pH, UA: 6.5 (ref 5.0–7.5)

## 2023-04-07 MED ORDER — METRONIDAZOLE 500 MG PO TABS
500.0000 mg | ORAL_TABLET | Freq: Two times a day (BID) | ORAL | 0 refills | Status: DC
Start: 2023-04-07 — End: 2023-09-04

## 2023-04-07 NOTE — Progress Notes (Signed)
+  trich and BV on vaginal swab, will rx flagyl, no sex or alcohol while taking and will need proof of treatment for trich in 2 weeks can be self swab. Katie Price  is a STD and your partner needs to be treated too. He can see his MD or go to health dept. I can treat but will need name,dob,any allergies and drug store

## 2023-04-08 ENCOUNTER — Other Ambulatory Visit: Payer: Self-pay | Admitting: Adult Health

## 2023-04-11 ENCOUNTER — Other Ambulatory Visit: Payer: Self-pay | Admitting: Adult Health

## 2023-04-11 LAB — URINE CULTURE

## 2023-04-11 MED ORDER — AMPICILLIN 500 MG PO CAPS: 500.0000 mg | ORAL_CAPSULE | Freq: Three times a day (TID) | ORAL | 0 refills | Status: DC

## 2023-04-11 NOTE — Progress Notes (Signed)
+  GBS in urine will rx ampicillin  

## 2023-04-27 ENCOUNTER — Encounter: Payer: Self-pay | Admitting: Obstetrics & Gynecology

## 2023-04-27 ENCOUNTER — Telehealth: Payer: PRIVATE HEALTH INSURANCE | Admitting: Obstetrics & Gynecology

## 2023-04-27 DIAGNOSIS — Z86711 Personal history of pulmonary embolism: Secondary | ICD-10-CM | POA: Diagnosis not present

## 2023-04-27 DIAGNOSIS — Z3009 Encounter for other general counseling and advice on contraception: Secondary | ICD-10-CM | POA: Diagnosis not present

## 2023-04-27 MED ORDER — MISOPROSTOL 200 MCG PO TABS: ORAL_TABLET | ORAL | 0 refills | Status: DC

## 2023-04-27 NOTE — Progress Notes (Signed)
 TELEHEALTH GYNECOLOGY VISIT ENCOUNTER NOTE  Provider location: [facility]   Patient location: Home  I connected with Katie Price on 04/27/23 at  1:30 PM EDT by video and verified that I am speaking with the correct person using two identifiers. Patient was unable to do MyChart audiovisual encounter due to technical difficulties, she tried several times.    I discussed the limitations, risks, security and privacy concerns of performing an evaluation and management service by telephone and the availability of in person appointments. I also discussed with the patient that there may be a patient responsible charge related to this service. The patient expressed understanding and agreed to proceed.  History:  Katie Price is a 23 y.o. G33P0010 female being evaluated today for contraceptive management and consideration for sterilization.     Currently on Depot- on/off for the last 10 yrs -She reports that doe not ever want to have a baby -Tried Nuvaring, patch, pills in the past- did have issues with compliance as well as recurrent yeast infections. -Additionally pt has h/o PE so she can no longer take combined options -Notes considerable weight gain and does not want to have to take Depot any longer  She does not have a period with Depot  She denies any abnormal vaginal discharge, bleeding, pelvic pain or other concerns.       Past Medical History:  Diagnosis Date   Anxiety and depression 09/04/2021   Asthma    Bipolar 1 disorder (HCC) 04/10/2016   Hypertension    MDD (major depressive disorder) 09/12/2016   PE (pulmonary thromboembolism) (HCC)    Tonsillar and adenoid hypertrophy 07/2016   snores during sleep, denies apnea   Past Surgical History:  Procedure Laterality Date   ETHMOIDECTOMY Right 03/31/2020   Procedure: RIGHT TOTAL ETHMOIDECTOMY AND FRONTAL RECESS exploration;  Surgeon: Newman Pies, MD;  Location: [facility];   Service: ENT;  Laterality: Right;   MAXILLARY ANTROSTOMY Bilateral 03/31/2020   Procedure: ENDOSCOPIC MAXILLARY ANTROSTOMY WITH TISSUE REMOVAL;  Surgeon: Newman Pies, MD;  Location: [facility];  Service: ENT;  Laterality: Bilateral;   SINUS ENDO WITH FUSION Bilateral 03/31/2020   Procedure: SINUS ENDOSCOPY WITH FUSION NAVIGATION;  Surgeon: Newman Pies, MD;  Location: [facility];  Service: ENT;  Laterality: Bilateral;   TONSILLECTOMY AND ADENOIDECTOMY N/A 08/02/2016   Procedure: TONSILLECTOMY AND ADENOIDECTOMY;  Surgeon: Newman Pies, MD;  Location: [facility];  Service: ENT;  Laterality: N/A;   TURBINATE REDUCTION Bilateral 03/31/2020   Procedure: TURBINATE REDUCTION;  Surgeon: Newman Pies, MD;  Location:  SURGERY CENTER;  Service: ENT;  Laterality: Bilateral;   WISDOM TOOTH EXTRACTION     The following portions of the patient's history were reviewed and updated as appropriate: allergies, current medications, past family history, past medical history, past social history, past surgical history and problem list.   Health Maintenance:  Normal pap and negative HRHPV on 08/2020.    Review of Systems:  Pertinent items noted in HPI and remainder of comprehensive ROS otherwise negative.  Physical Exam:   General:  Alert, oriented and cooperative.   Mental Status: Normal mood and affect perceived. Normal judgment and thought content.  Physical exam deferred due to nature of the encounter  Labs and Imaging No results found for this or any previous visit (from the past 2 weeks). No results found.    Assessment and Plan:     Contraceptive management    -  Discussed sterilization via laparoscopic bilateral salpingectomy -Reviewed with patient that especially for women under the age of 9 strong concern for regret as this procedure is not reversible -Reviewed alternative options specifically focused on LARCs, discussed ParaGard as this is a non- hormonal  IUD -Questions and concerns were addressed patient agreeable to trial of ParaGard -will plan for pre-medication including cytotec and ibuprofen  I discussed the assessment and treatment plan with the patient. The patient was provided an opportunity to ask questions and all were answered. The patient agreed with the plan and demonstrated an understanding of the instructions.   I provided 20 minutes of non-face-to-face time during this encounter.   Sharon Seller, DO [facility] [employer], [facility]

## 2023-05-23 ENCOUNTER — Ambulatory Visit: Payer: PRIVATE HEALTH INSURANCE | Admitting: Obstetrics & Gynecology

## 2023-05-23 ENCOUNTER — Ambulatory Visit: Payer: PRIVATE HEALTH INSURANCE | Admitting: Adult Health

## 2023-05-23 ENCOUNTER — Encounter: Payer: Self-pay | Admitting: Obstetrics & Gynecology

## 2023-05-23 VITALS — BP 127/87 | HR 101 | Ht 68.0 in | Wt 293.2 lb

## 2023-05-23 DIAGNOSIS — Z3202 Encounter for pregnancy test, result negative: Secondary | ICD-10-CM

## 2023-05-23 DIAGNOSIS — Z30430 Encounter for insertion of intrauterine contraceptive device: Secondary | ICD-10-CM | POA: Diagnosis not present

## 2023-05-23 LAB — POCT URINE PREGNANCY: Preg Test, Ur: NEGATIVE

## 2023-05-23 MED ORDER — LEVONORGESTREL 20 MCG/DAY IU IUD
1.0000 | INTRAUTERINE_SYSTEM | Freq: Once | INTRAUTERINE | Status: AC
  Administered 2023-05-23: 1 via INTRAUTERINE

## 2023-05-23 NOTE — Progress Notes (Signed)
 IUD INSERTION Patient name: Katie Price MRN [id number]  Date of birth: 11/16/2000 Subjective Findings:   Katie Price is a 23 y.o. G49P0010  female being seen today for insertion of a IUD  In review initially she was considering permanent sterilization, but was agreeable to trial of IUD.  Reviewed options and desires long-term Mirena.  She reports no acute complaints since her prior visit   The risks and benefits of the method and placement have been thouroughly reviewed with the patient and all questions were answered.  Specifically the patient is aware of failure rate of 01/998, expulsion of the IUD and of possible perforation.  The patient is aware of irregular bleeding due to the method and understands the incidence of irregular bleeding diminishes with time.  Signed copy of informed consent in chart.      02/17/2023    1:26 PM 02/07/2023    2:06 PM 12/27/2022    2:13 PM 11/15/2022    4:22 PM 09/06/2022    1:42 PM  Depression screen PHQ 2/9  Decreased Interest  3 2 2  0  Down, Depressed, Hopeless  3 2 2  0  PHQ - 2 Score  6 4 4  0  Altered sleeping  3 2 1  0  Tired, decreased energy  2 2 3  0  Change in appetite  1 0 0 2  Feeling bad or failure about yourself   3 0 0 0  Trouble concentrating  3 3 3  0  Moving slowly or fidgety/restless  3 3 3  0  Suicidal thoughts  0 0 0 0  PHQ-9 Score  21 14 14 2   Difficult doing work/chores          Information is confidential and restricted. Go to Review Flowsheets to unlock data.        02/17/2023    1:32 PM 02/07/2023    2:08 PM 12/27/2022    2:15 PM 11/15/2022    4:24 PM  GAD 7 : Generalized Anxiety Score  Nervous, Anxious, on Edge  3 3 3   Control/stop worrying  3 3 3   Worry too much - different things  3 3 3   Trouble relaxing  3 2 2   Restless  3 2 3   Easily annoyed or irritable  3 3 3   Afraid - awful might happen  3 3 1   Total GAD 7 Score  21 19 18   Anxiety Difficulty         Information is confidential and restricted. Go to Review  Flowsheets to unlock data.     Pertinent History Reviewed:   Reviewed past medical,surgical, social, obstetrical and family history.  Reviewed problem list, medications and allergies. Objective Findings & Procedure:   Vitals:   05/23/23 1103  BP: 127/87  Pulse: (!) 101  Weight: 293 lb 3.2 oz (133 kg)  Height: 5\' 8"  (1.727 m)  Body mass index is 44.58 kg/m.  Results for orders placed or performed in visit on 05/23/23 (from the past 24 hours)  POCT urine pregnancy   Collection Time: 05/23/23 11:06 AM  Result Value Ref Range   Preg Test, Ur Negative Negative     Time out was performed.  A sterile speculum was placed in the vagina.  The cervix was visualized, prepped using Betadine, and grasped with a single tooth tenaculum. The uterus was found to be anteroflexed and it sounded to 6.5 cm.  Mirena  IUD placed per manufacturer's recommendations. The strings were trimmed to approximately 3 cm.  The patient tolerated the procedure well.   Chaperone: Lorean Rodes   Assessment & Plan:   1) Mirena IUD insertion The patient was given post procedure instructions, including signs and symptoms of infection and to check for the strings after each menses or each month, and refraining from intercourse or anything in the vagina for 3 days. She was given a care card with date IUD placed, and date IUD to be removed. She is scheduled for a f/u appointment in 6-8 weeks.  Orders Placed This Encounter  Procedures   POCT urine pregnancy    Return for 6-8wk IUD check.  Eulalah Rupert, DO Attending Obstetrician & Gynecologist, [facility] for [employer], [facility]

## 2023-06-22 IMAGING — US US EXTREM LOW VENOUS
1 series · 13 of 24 positions shown · non-contrast
Comparison: None.

CLINICAL DATA: Elevated D-dimer. History of pulmonary embolism.
Former smoker. Evaluate for acute or chronic DVT.



[Series 1: us venous img lower bilat (dvt) · portal-venous · 13 of 79 slices shown]
[im 1/79]
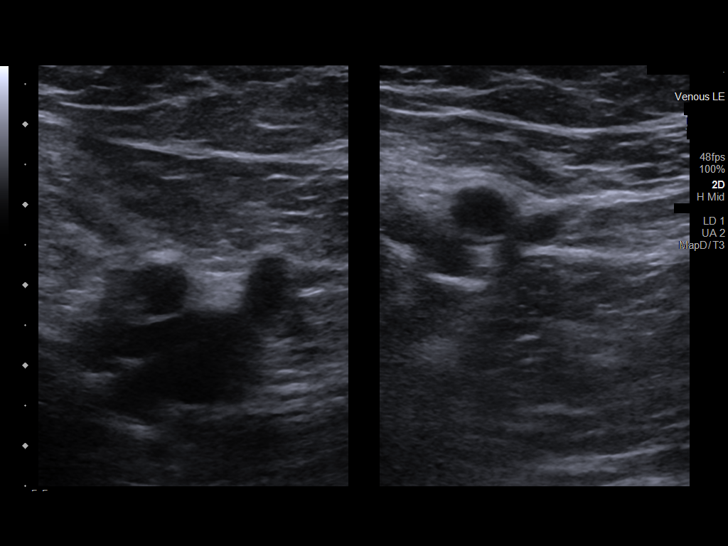
[im 7/79]
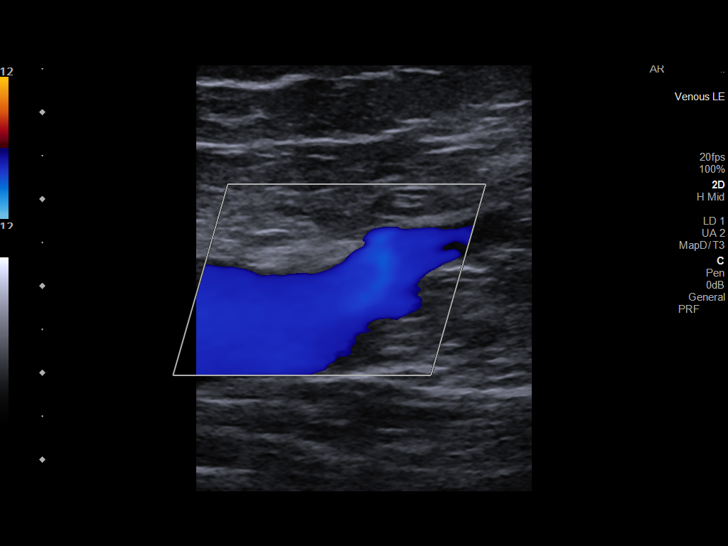
[im 14/79]
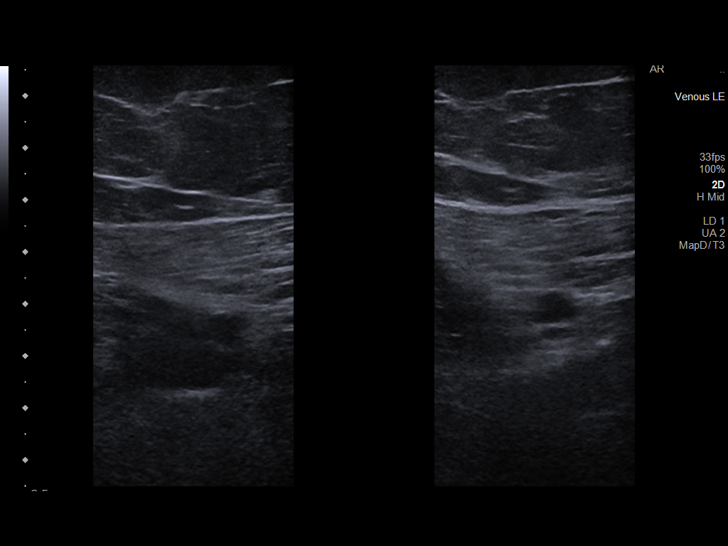
[im 21/79]
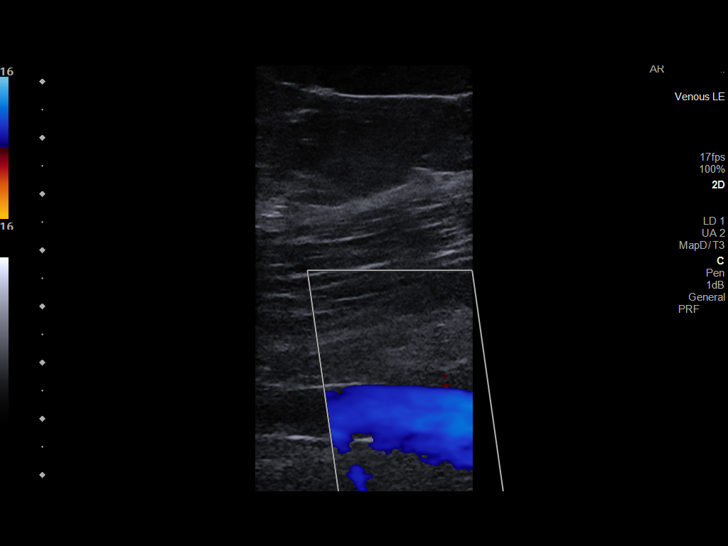
[im 28/79]
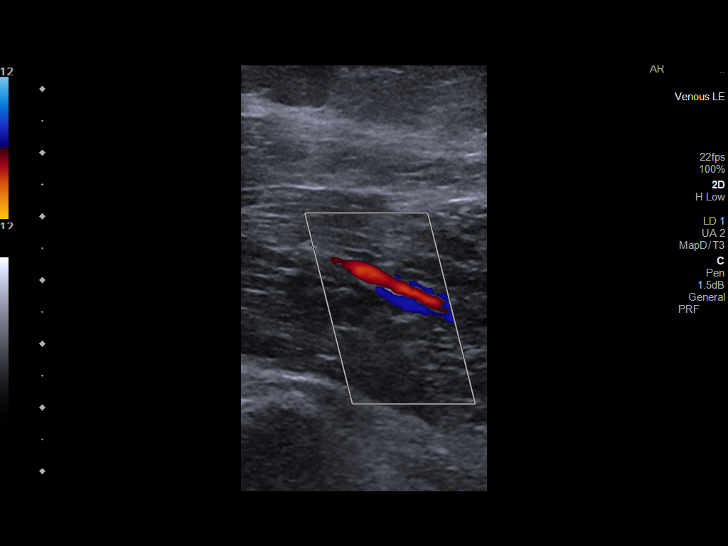
[im 34/79]
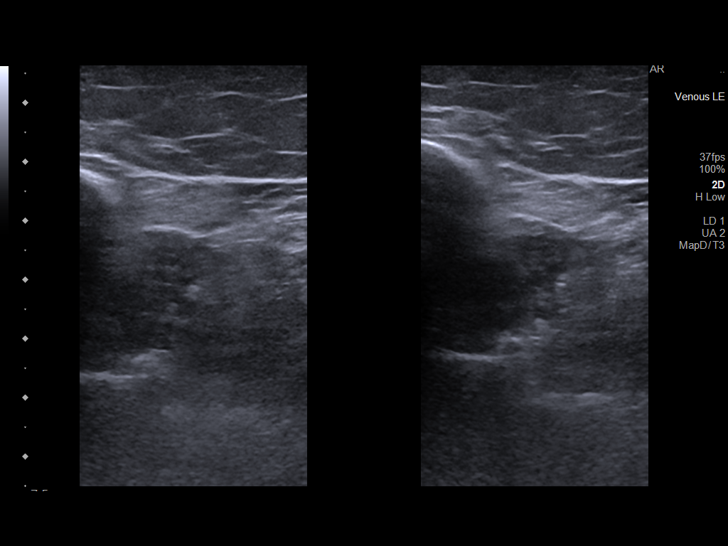
[im 41/79]
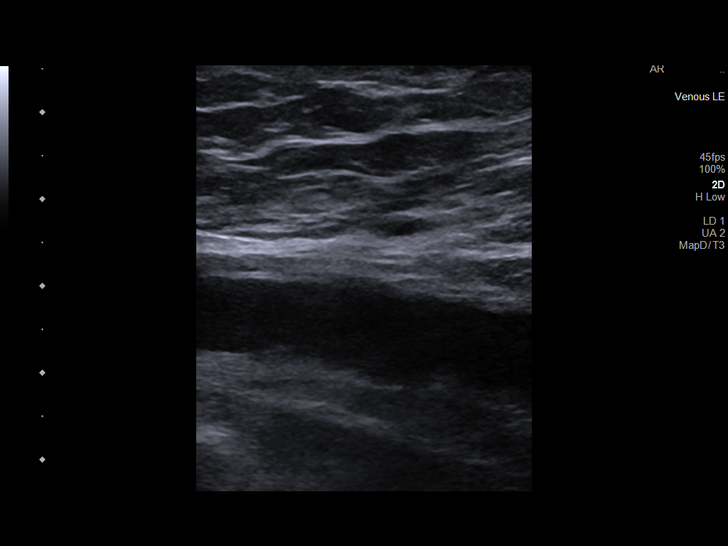
[im 45/79]
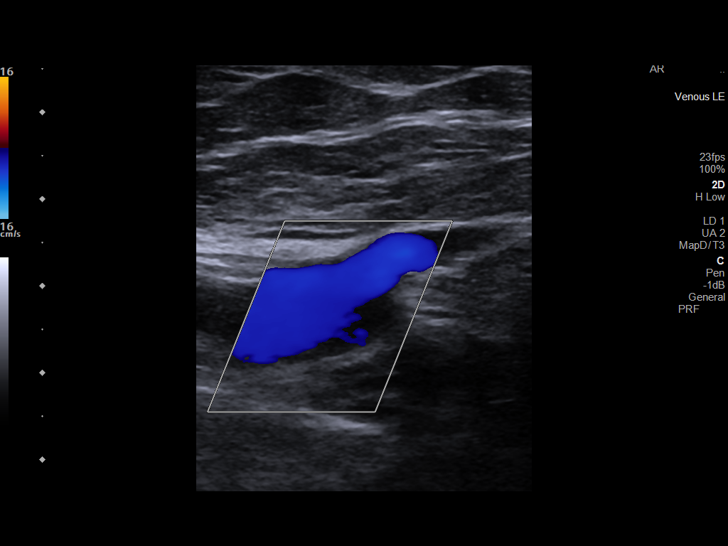
[im 51/79]
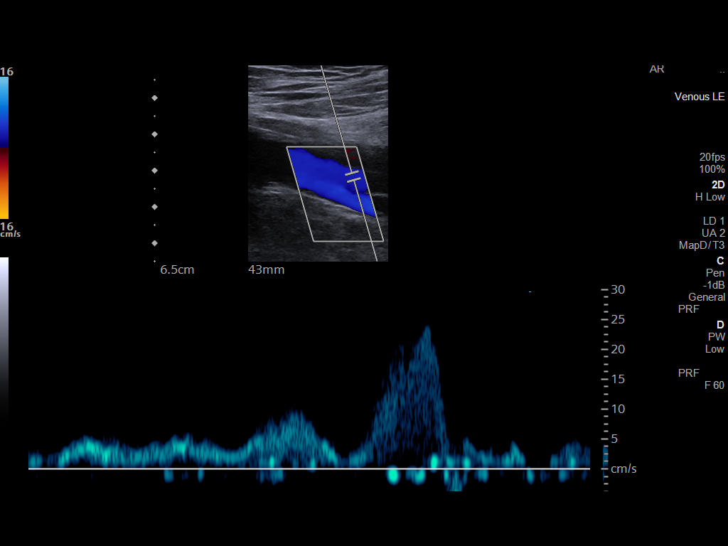
[im 58/79]
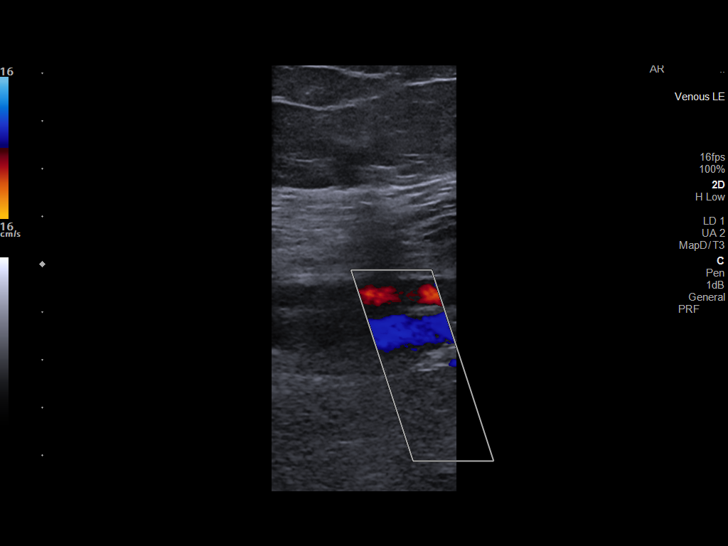
[im 65/79]
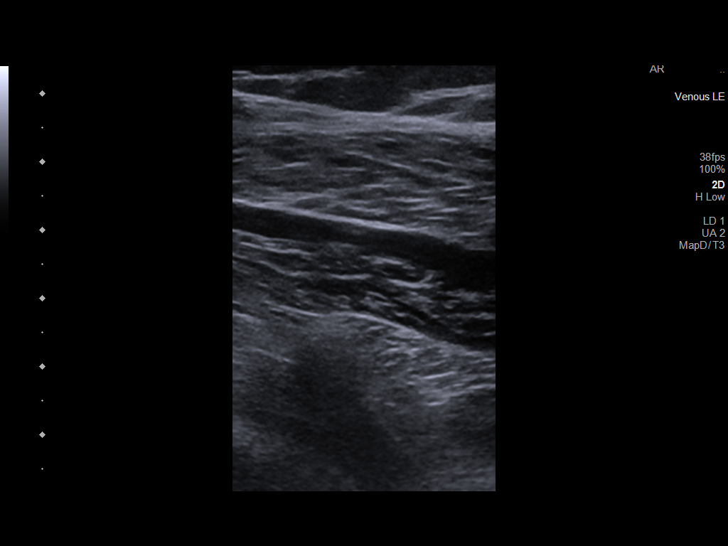
[im 72/79]
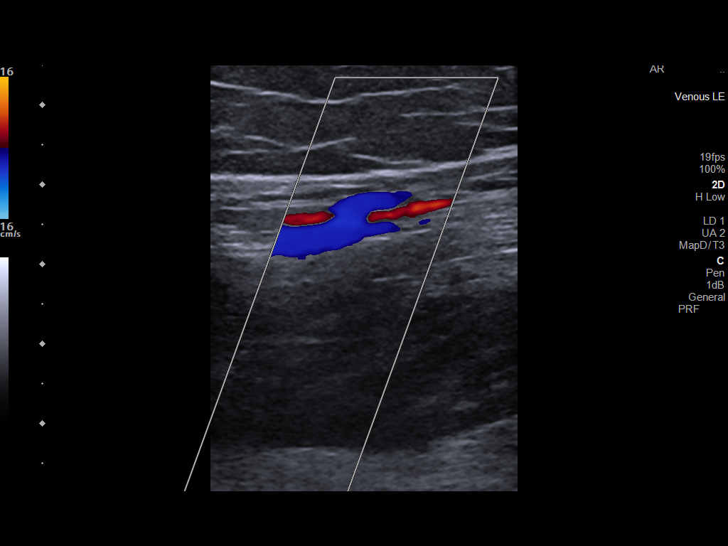
[im 79/79]
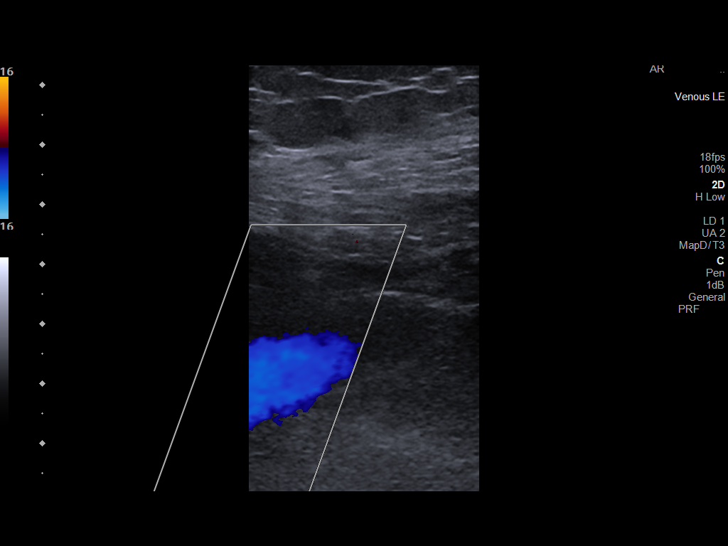

[13 of 24 positions shown; findings below may reference images not displayed]

FINDINGS: RIGHT LOWER EXTREMITY

Common Femoral Vein: No evidence of thrombus. Normal
compressibility, respiratory phasicity and response to augmentation.

Saphenofemoral Junction: No evidence of thrombus. Normal
compressibility and flow on color Doppler imaging.

Profunda Femoral Vein: No evidence of thrombus. Normal
compressibility and flow on color Doppler imaging.

Femoral Vein: No evidence of thrombus. Normal compressibility,
respiratory phasicity and response to augmentation.

Popliteal Vein: No evidence of thrombus. Normal compressibility,
respiratory phasicity and response to augmentation.

Calf Veins: No evidence of thrombus. Normal compressibility and flow
on color Doppler imaging.

Superficial Great Saphenous Vein: No evidence of thrombus. Normal
compressibility.

Venous Reflux:  None.

Other Findings:  None.

LEFT LOWER EXTREMITY

Common Femoral Vein: No evidence of thrombus. Normal
compressibility, respiratory phasicity and response to augmentation.

Saphenofemoral Junction: No evidence of thrombus. Normal
compressibility and flow on color Doppler imaging.

Profunda Femoral Vein: No evidence of thrombus. Normal
compressibility and flow on color Doppler imaging.

Femoral Vein: No evidence of thrombus. Normal compressibility,
respiratory phasicity and response to augmentation.

Popliteal Vein: No evidence of thrombus. Normal compressibility,
respiratory phasicity and response to augmentation.

Calf Veins: No evidence of thrombus. Normal compressibility and flow
on color Doppler imaging.

Superficial Great Saphenous Vein: No evidence of thrombus. Normal
compressibility.

Venous Reflux:  None.

Other Findings:  None.
IMPRESSION: No evidence of acute or chronic DVT within either lower extremity.

## 2023-07-04 ENCOUNTER — Ambulatory Visit: Payer: PRIVATE HEALTH INSURANCE | Admitting: Advanced Practice Midwife

## 2023-07-18 ENCOUNTER — Encounter: Payer: Self-pay | Admitting: Advanced Practice Midwife

## 2023-07-18 ENCOUNTER — Ambulatory Visit: Payer: PRIVATE HEALTH INSURANCE | Admitting: Advanced Practice Midwife

## 2023-07-18 VITALS — BP 118/81 | HR 96 | Ht 68.0 in | Wt 309.0 lb

## 2023-07-18 DIAGNOSIS — Z30431 Encounter for routine checking of intrauterine contraceptive device: Secondary | ICD-10-CM

## 2023-07-18 NOTE — Progress Notes (Signed)
 History:  23 y.o. G1P0010 here today for today for IUD string check; Mirena  IUD was placed  05/23/23. No complaints about the IUD, no concerning side effects. No bleeding at all (still had 2 months before DEPO ran out)  The following portions of the patient's history were reviewed and updated as appropriate: allergies, current medications, past family history, past medical history, past social history, past surgical history and problem list.  Review of Systems:   Constitutional: Negative for fever and chills Eyes: Negative for visual disturbances Respiratory: Negative for shortness of breath, dyspnea Cardiovascular: Negative for chest pain or palpitations  Gastrointestinal: Negative for vomiting, diarrhea and constipation Genitourinary: Negative for dysuria and urgency Musculoskeletal: Negative for back pain, joint pain, myalgias  Neurological: Negative for dizziness and headaches    Objective:  Physical Exam Blood pressure 118/81, pulse 96, height 5' 8 (1.727 m), weight (!) 309 lb (140.2 kg). Gen: NAD Abd: Soft, nontender and nondistended Pelvic: Bedside US  reveals properly place IUD. SSE:  IUD strings not visible  Assessment & Plan:  Normal IUD check. Patient to keep IUD in place for 8 years; can come in for removal if she desires pregnancy.

## 2023-07-27 ENCOUNTER — Encounter: Payer: Self-pay | Admitting: Advanced Practice Midwife

## 2023-09-04 ENCOUNTER — Other Ambulatory Visit: Payer: Self-pay

## 2023-09-04 ENCOUNTER — Emergency Department (HOSPITAL_BASED_OUTPATIENT_CLINIC_OR_DEPARTMENT_OTHER)
Admission: EM | Admit: 2023-09-04 | Discharge: 2023-09-04 | Disposition: A | Discharge status: Home / Self Care | Payer: PRIVATE HEALTH INSURANCE

## 2023-09-04 ENCOUNTER — Encounter (HOSPITAL_BASED_OUTPATIENT_CLINIC_OR_DEPARTMENT_OTHER): Payer: Self-pay

## 2023-09-04 ENCOUNTER — Telehealth: Payer: PRIVATE HEALTH INSURANCE | Admitting: Family

## 2023-09-04 DIAGNOSIS — A599 Trichomoniasis, unspecified: Secondary | ICD-10-CM | POA: Diagnosis not present

## 2023-09-04 DIAGNOSIS — Z7951 Long term (current) use of inhaled steroids: Secondary | ICD-10-CM | POA: Diagnosis not present

## 2023-09-04 DIAGNOSIS — N3001 Acute cystitis with hematuria: Secondary | ICD-10-CM | POA: Insufficient documentation

## 2023-09-04 DIAGNOSIS — Z91040 Latex allergy status: Secondary | ICD-10-CM | POA: Insufficient documentation

## 2023-09-04 DIAGNOSIS — J45909 Unspecified asthma, uncomplicated: Secondary | ICD-10-CM | POA: Diagnosis not present

## 2023-09-04 DIAGNOSIS — Z79899 Other long term (current) drug therapy: Secondary | ICD-10-CM | POA: Diagnosis not present

## 2023-09-04 DIAGNOSIS — I10 Essential (primary) hypertension: Secondary | ICD-10-CM | POA: Diagnosis not present

## 2023-09-04 DIAGNOSIS — Z711 Person with feared health complaint in whom no diagnosis is made: Secondary | ICD-10-CM

## 2023-09-04 DIAGNOSIS — B3731 Acute candidiasis of vulva and vagina: Secondary | ICD-10-CM | POA: Diagnosis not present

## 2023-09-04 DIAGNOSIS — R300 Dysuria: Secondary | ICD-10-CM | POA: Diagnosis present

## 2023-09-04 LAB — WET PREP, GENITAL
Clue Cells Wet Prep HPF POC: NONE SEEN
Sperm: NONE SEEN
WBC, Wet Prep HPF POC: >=10 — AB (ref <10)
Yeast Wet Prep HPF POC: NONE SEEN

## 2023-09-04 LAB — URINALYSIS, ROUTINE W REFLEX MICROSCOPIC
Bilirubin Urine: NEGATIVE
Glucose, UA: NEGATIVE mg/dL
Ketones, ur: NEGATIVE mg/dL
Nitrite: NEGATIVE
Protein, ur: NEGATIVE mg/dL
Specific Gravity, Urine: 1.024 (ref 1.005–1.030)
pH: 6.5 (ref 5.0–8.0)

## 2023-09-04 MED ORDER — METRONIDAZOLE 500 MG PO TABS
500.0000 mg | ORAL_TABLET | Freq: Two times a day (BID) | ORAL | 0 refills | Status: DC
Start: 2023-09-04 — End: 2023-09-23

## 2023-09-04 MED ORDER — FLUCONAZOLE 150 MG PO TABS
150.0000 mg | ORAL_TABLET | ORAL | 0 refills | Status: DC | PRN
Start: 2023-09-04 — End: 2023-09-23

## 2023-09-04 MED ORDER — FLUCONAZOLE 150 MG PO TABS: 150.0000 mg | ORAL_TABLET | ORAL | 0 refills | Status: DC | PRN

## 2023-09-04 MED ORDER — CEFTRIAXONE SODIUM 500 MG IJ SOLR
500.0000 mg | Freq: Once | INTRAMUSCULAR | Status: AC
  Administered 2023-09-04: 500 mg via INTRAMUSCULAR
  Filled 2023-09-04: qty 500

## 2023-09-04 MED ORDER — DOXYCYCLINE HYCLATE 100 MG PO CAPS: 100.0000 mg | ORAL_CAPSULE | Freq: Two times a day (BID) | ORAL | 0 refills | Status: AC

## 2023-09-04 MED ORDER — LIDOCAINE HCL (PF) 1 % IJ SOLN
INTRAMUSCULAR | Status: AC
  Administered 2023-09-04: 5 mL
  Filled 2023-09-04: qty 5

## 2023-09-04 MED ORDER — NITROFURANTOIN MONOHYD MACRO 100 MG PO CAPS: 100.0000 mg | ORAL_CAPSULE | Freq: Two times a day (BID) | ORAL | 0 refills | Status: DC

## 2023-09-04 NOTE — Discharge Instructions (Signed)
 Please take the antibiotics that you are prescribed as prescribed as entirety for management of your symptoms.  I have also given you a prescription for Diflucan  to help prevent a yeast infection given the amount of antibiotics you are taking.  Your gonorrhea and Chlamydia swabs are pending at your discharge and you should monitor your MyChart for these test results.  We have gone ahead and treated you for these.  Please inform all sexual partners of any positive test results.  Please do not have sexual intercourse until your symptoms have resolved and you have completed treatment.  You should follow-up with your OB/GYN to have a test of cure after you have completed antibiotics.  In the future, I recommend that you use protection when you are sexually active to prevent recurrence of STIs.  Return if development of any new or worsening symptoms

## 2023-09-04 NOTE — ED Provider Notes (Addendum)
 [place] EMERGENCY DEPARTMENT AT [facility] Provider Note   CSN: [id number] Arrival date & time: 09/04/23  1206     Patient presents with: Urinary Tract Infection   Katie Price is a 23 y.o. female.   Patient with noncontributory past medical history presents today with complaints of dysuria and vaginal discharge.  She reports that symptoms began 5 days ago.  Reports she has been diagnosed with trichomonas and UTI before and treated with Flagyl  and Macrobid .  Presents requesting same.  Denies any abdominal pain.  No nausea, vomiting, or diarrhea.  She does have an IUD.  The history is provided by the patient. No language interpreter was used.  Urinary Tract Infection Associated symptoms: vaginal discharge        Prior to Admission medications   Medication Sig Start Date End Date Taking? Authorizing Provider  albuterol  (PROVENTIL ) (2.5 MG/3ML) 0.083% nebulizer solution inhale the contents of 1 vial (3ml) by nebulization 3 (three) times daily. 01/27/22     buPROPion  (WELLBUTRIN  XL) 150 MG 24 hr tablet Take 1 tablet (150 mg total) by mouth daily. 02/17/23   Rankin, Shuvon B, NP  busPIRone  (BUSPAR ) 7.5 MG tablet Take 1 tablet (7.5 mg total) by mouth 3 (three) times daily. 02/17/23   Rankin, Shuvon B, NP  diphenhydrAMINE (BENADRYL) 25 mg capsule Take 25 mg by mouth every 6 (six) hours as needed.    [provider]  fexofenadine (ALLEGRA) 180 MG tablet Take 180 mg by mouth daily.    [provider]  fluconazole  (DIFLUCAN ) 150 MG tablet Take 1 tablet (150 mg total) by mouth every three (3) days as needed. 09/04/23   Lavell Bari LABOR, FNP  fluticasone -salmeterol (ADVAIR  DISKUS) 250-50 MCG/ACT AEPB Inhale 1 puff into the lungs in the morning and at bedtime. 09/14/21   Stuart Vernell Norris, PA-C  hydrochlorothiazide  (MICROZIDE ) 12.5 MG capsule Take 1 capsule (12.5 mg total) by mouth daily. 03/28/23   Signa Delon LABOR, NP  hydrOXYzine  (VISTARIL ) 25 MG capsule Take 1  capsule (25 mg total) by mouth 3 (three) times daily. 02/17/23   Rankin, Shuvon B, NP  metroNIDAZOLE  (FLAGYL ) 500 MG tablet Take 1 tablet (500 mg total) by mouth 2 (two) times daily. Patient not taking: Reported on 07/18/2023 04/07/23   Signa Delon LABOR, NP  misoprostol  (CYTOTEC ) 200 MCG tablet Place two tablets in the vagina at least 4 hours before your appointment Patient not taking: Reported on 07/18/2023 04/27/23   Ozan, Jennifer, DO  Multiple Vitamin (MULTIVITAMIN) tablet Take 1 tablet by mouth daily.    [provider]  ondansetron  (ZOFRAN ) 4 MG tablet Take 1 tablet (4 mg total) by mouth every 8 (eight) hours as needed. 12/27/22   Signa Delon LABOR, NP  propranolol ER (INDERAL LA) 80 MG 24 hr capsule Take 80 mg by mouth daily. 03/22/23   [provider]  triamcinolone  cream (KENALOG ) 0.1 % Apply topically to affected area 2 (two) times daily. 02/10/22     VENTOLIN  HFA 108 (90 Base) MCG/ACT inhaler Inhale 2 puffs into the lungs every 6 (six) hours as needed. 02/26/21   [provider]    Allergies: Adhesive [tape], Latex, Morphine, and Phentermine    Review of Systems  Genitourinary:  Positive for dysuria and vaginal discharge.  All other systems reviewed and are negative.   Updated Vital Signs BP 116/83 (BP Location: Right Arm)   Pulse 96   Temp 98.3 F (36.8 C) (Oral)   Resp 18   SpO2  100%   Physical Exam Vitals and nursing note reviewed.  Constitutional:      General: She is not in acute distress.    Appearance: Normal appearance. She is normal weight. She is not ill-appearing, toxic-appearing or diaphoretic.  HENT:     Head: Normocephalic and atraumatic.  Cardiovascular:     Rate and Rhythm: Normal rate.  Pulmonary:     Effort: Pulmonary effort is normal. No respiratory distress.  Abdominal:     General: Abdomen is flat.     Palpations: Abdomen is soft.     Tenderness: There is no abdominal tenderness.  Genitourinary:    Comments: Patient  deferred pelvic exam Musculoskeletal:        General: Normal range of motion.     Cervical back: Normal range of motion.  Skin:    General: Skin is warm and dry.  Neurological:     General: No focal deficit present.     Mental Status: She is alert.  Psychiatric:        Mood and Affect: Mood normal.        Behavior: Behavior normal.     (all labs ordered are listed, but only abnormal results are displayed) Labs Reviewed  WET PREP, GENITAL - Abnormal; Notable for the following components:      Result Value   Trich, Wet Prep PRESENT (*)    WBC, Wet Prep HPF POC >=10 (*)    All other components within normal limits  URINALYSIS, ROUTINE W REFLEX MICROSCOPIC - Abnormal; Notable for the following components:   Hgb urine dipstick TRACE (*)    Leukocytes,Ua LARGE (*)    Bacteria, UA RARE (*)    All other components within normal limits  GC/CHLAMYDIA PROBE AMP ([place]) NOT AT [facility]    EKG: None  Radiology: No results found.   Procedures   Medications Ordered in the ED - No data to display                                  Medical Decision Making Amount and/or Complexity of Data Reviewed Labs: ordered.  Risk Prescription drug management.   This patient is a 23 y.o. female  who presents to the ED for concern of dysuria, vaginal discharge.   Differential diagnoses prior to evaluation: The emergent differential diagnosis includes, but is not limited to,  UTI, STI . This is not an exhaustive differential.   Past Medical History / Co-morbidities / Social History:  has a past medical history of Anxiety and depression (09/04/2021), Asthma, Bipolar 1 disorder (HCC) (04/10/2016), Bipolar 1 disorder, depressed, moderate (HCC) (05/14/2014), Bipolar disorder (HCC), Encounter for gynecological examination with Papanicolaou smear of cervix (07/22/2020), Encounter for initial prescription of contraceptive pills (06/04/2020), Encounter for initial prescription of contraceptives  (06/04/2020), Encounter for initial prescription of vaginal ring hormonal contraceptive (11/01/2019), Failed vision screen (04/12/2014), General counseling and advice on contraceptive management (11/10/2020), Hypertension, MDD (major depressive disorder) (09/12/2016), PE (pulmonary thromboembolism) (HCC), Pregnancy examination or test, negative result (06/15/2017), Screening examination for STD (sexually transmitted disease) (06/15/2017), and Tonsillar and adenoid hypertrophy (07/2016).  Additional history: Chart reviewed. Pertinent results include: Does look like she has had trichomoniasis several times before  Physical Exam: Physical exam performed. The pertinent findings include: Well-appearing, abdomen soft and nontender.  Patient deferred pelvic exam.  Lab Tests: I personally interpreted labs/imaging and the pertinent results include: UA shows signs of infection, wet prep  positive for trichomonas with greater than 10 WBCs.   Medications: I ordered medication including IM Rocephin  for STI prophylaxis.  I have reviewed the patients home medicines and have made adjustments as needed.   Disposition: After consideration of the diagnostic results and the patients response to treatment, I feel that emergency department workup does not suggest an emergent condition requiring admission or immediate intervention beyond what has been performed at this time. The plan is: Discharge with Flagyl , doxycycline , and Macrobid  for UTI, STI prophylaxis, and trichomoniasis.  Will also give Diflucan  to cover for yeast infection given the amount of antibiotics she is going to be taking.  Discussed with her that her gonorrhea and Chlamydia swabs are pending at her discharge.  Discussed MyChart monitoring for test results.  She has no signs or symptoms to suggest PID.  I did offer a pelvic exam which she deferred.  Discussed abstaining from sexual intercourse until her symptoms have resolved.  Emphasize importance of  using protection when sexually active and close outpatient OB/GYN follow-up. Evaluation and diagnostic testing in the emergency department does not suggest an emergent condition requiring admission or immediate intervention beyond what has been performed at this time.  Plan for discharge with close PCP follow-up.  Patient is understanding and amenable with plan, educated on red flag symptoms that would prompt immediate return.  Patient discharged in stable condition.   Final diagnoses:  Acute cystitis with hematuria  Trichomoniasis  Concern about STD in female without diagnosis    ED Discharge Orders          Ordered    doxycycline  (VIBRAMYCIN ) 100 MG capsule  2 times daily        09/04/23 1458    metroNIDAZOLE  (FLAGYL ) 500 MG tablet  2 times daily        09/04/23 1458    nitrofurantoin , macrocrystal-monohydrate, (MACROBID ) 100 MG capsule  2 times daily        09/04/23 1458    fluconazole  (DIFLUCAN ) 150 MG tablet  Every 3 DAYS PRN        09/04/23 1458          An After Visit Summary was printed and given to the patient.      Linken Mcglothen A, PA-C 09/04/23 1459    Nora Lauraine LABOR, PA-C 09/04/23 1500    Kammerer, Megan L, DO 09/07/23 1622

## 2023-09-04 NOTE — ED Triage Notes (Signed)
 Patient reports fevers since Friday and some urinary frequency and burning. She also requests a self swab due to unprotected sex.

## 2023-09-04 NOTE — Progress Notes (Signed)

## 2023-09-05 LAB — GC/CHLAMYDIA PROBE AMP (~~LOC~~) NOT AT ARMC
Chlamydia: NEGATIVE
Comment: NEGATIVE
Comment: NORMAL
Neisseria Gonorrhea: NEGATIVE

## 2023-09-23 ENCOUNTER — Encounter: Payer: Self-pay | Admitting: Adult Health

## 2023-09-23 ENCOUNTER — Other Ambulatory Visit (HOSPITAL_COMMUNITY)
Admission: RE | Admit: 2023-09-23 | Discharge: 2023-09-23 | Disposition: A | Discharge status: Home / Self Care | Payer: PRIVATE HEALTH INSURANCE | Source: Ambulatory Visit | Attending: Obstetrics & Gynecology | Admitting: Obstetrics & Gynecology

## 2023-09-23 ENCOUNTER — Ambulatory Visit (INDEPENDENT_AMBULATORY_CARE_PROVIDER_SITE_OTHER): Payer: PRIVATE HEALTH INSURANCE | Admitting: Adult Health

## 2023-09-23 VITALS — BP 123/84 | HR 99 | Ht 68.0 in | Wt 306.5 lb

## 2023-09-23 DIAGNOSIS — Z30432 Encounter for removal of intrauterine contraceptive device: Secondary | ICD-10-CM | POA: Diagnosis not present

## 2023-09-23 DIAGNOSIS — Z01419 Encounter for gynecological examination (general) (routine) without abnormal findings: Secondary | ICD-10-CM | POA: Diagnosis present

## 2023-09-23 DIAGNOSIS — Z3202 Encounter for pregnancy test, result negative: Secondary | ICD-10-CM | POA: Diagnosis not present

## 2023-09-23 DIAGNOSIS — Z30013 Encounter for initial prescription of injectable contraceptive: Secondary | ICD-10-CM

## 2023-09-23 DIAGNOSIS — N926 Irregular menstruation, unspecified: Secondary | ICD-10-CM

## 2023-09-23 DIAGNOSIS — Z1151 Encounter for screening for human papillomavirus (HPV): Secondary | ICD-10-CM | POA: Insufficient documentation

## 2023-09-23 DIAGNOSIS — Z113 Encounter for screening for infections with a predominantly sexual mode of transmission: Secondary | ICD-10-CM | POA: Diagnosis not present

## 2023-09-23 DIAGNOSIS — Z124 Encounter for screening for malignant neoplasm of cervix: Secondary | ICD-10-CM

## 2023-09-23 DIAGNOSIS — R102 Pelvic and perineal pain: Secondary | ICD-10-CM

## 2023-09-23 DIAGNOSIS — Z3009 Encounter for other general counseling and advice on contraception: Secondary | ICD-10-CM

## 2023-09-23 DIAGNOSIS — Z309 Encounter for contraceptive management, unspecified: Secondary | ICD-10-CM | POA: Insufficient documentation

## 2023-09-23 DIAGNOSIS — Z86711 Personal history of pulmonary embolism: Secondary | ICD-10-CM

## 2023-09-23 LAB — POCT URINE PREGNANCY: Preg Test, Ur: NEGATIVE

## 2023-09-23 MED ORDER — MEDROXYPROGESTERONE ACETATE 150 MG/ML IM SUSP: 150.0000 mg | INTRAMUSCULAR | 4 refills | Status: AC

## 2023-09-23 MED ORDER — MEDROXYPROGESTERONE ACETATE 150 MG/ML IM SUSY
150.0000 mg | PREFILLED_SYRINGE | Freq: Once | INTRAMUSCULAR | Status: AC
  Administered 2023-09-23: 150 mg via INTRAMUSCULAR

## 2023-09-23 NOTE — Progress Notes (Signed)
 Subjective:     Patient ID: Katie Price, female   DOB: 03-04-2000, 23 y.o.   MRN: [id number]  HPI Katie Price is a 23 year old black female,single, G1P0010, in for IUD removal, has had bleeding on and off and cramping. She took her sisters megace and it did not work, she is thinking she wants depo as used in past. She has history of PE after surgery. And she needs a pap.    Component Value Date/Time   DIAGPAP  07/22/2020 1151    - Negative for intraepithelial lesion or malignancy (NILM)   ADEQPAP  07/22/2020 1151    Satisfactory but limited for evaluation with partially obscuring blood;   ADEQPAP transformation zone component present. 07/22/2020 1151   ADEQPAP  07/22/2020 1151    Satisfactory but limited for evaluation with scant cellularity;   ADEQPAP transformation zone component present. 07/22/2020 1151   PCP is T Leavy FNP Review of Systems For IUD removal Bleeding on and off +cramping Reviewed past medical,surgical, social and family history. Reviewed medications and allergies.     Objective:   Physical Exam BP 123/84 (BP Location: Right Arm, Patient Position: Sitting, Cuff Size: Large)   Pulse 99   Ht 5' 8 (1.727 m)   Wt (!) 306 lb 8 oz (139 kg)   LMP 08/01/2023   BMI 46.60 kg/m  UPT is negative  Consent signed and time out called.   Skin warm and dry.Pelvic: external genitalia is normal in appearance no lesions, vagina: pink,urethra has no lesions or masses noted, cervix:smooth, pap with GC/CHL performed, IUD strings at os, grasped with forceps and asked to cough and IUD easily removed, uterus: normal size, shape and contour, non tender, no masses felt, adnexa: no masses or tenderness noted. Bladder is non tender and no masses felt.  [place] - 09/23/23 1016       Pregnancy Intention Screening   Does the patient want to become pregnant in the next year? No    Does the patient's partner want to become pregnant in the next year? No    Would the patient like to discuss  contraceptive options today? Yes      Contraception Wrap Up   Current Method IUD or IUS    End Method Hormonal Injection    Contraception Counseling Provided Yes    How was the end contraceptive method provided? Provided on site   got rx too        Examination chaperoned by Clarita Salt LPN  Assessment:     1. Negative pregnancy test - POCT urine pregnancy  2. Encounter for IUD removal (Primary) IUD easily removed   3. Irregular bleeding  4. Pelvic cramping  5. Routine Papanicolaou smear Pap sent Pap in 3 years if negative  - Cytology - PAP( [facility])  6. Encounter for initial prescription of injectable contraceptive She wants depo, first injection in office and rx sent Meds ordered this encounter  Medications   medroxyPROGESTERone  (DEPO-PROVERA ) 150 MG/ML injection    Sig: Inject 1 mL (150 mg total) into the muscle every 3 (three) months.    Dispense:  1 mL    Refill:  4    Supervising Provider:   JAYNE MINDER H [[id number]]   medroxyPROGESTERone  Acetate SUSY 150 mg    - medroxyPROGESTERone  Acetate SUSY 150 mg  7. History of pulmonary embolism   8. Encounter for other general counseling or advice on contraception Discussed progestin only options she wants depo for now, she is aware  of meningioma risk     Plan:     Follow up in 12 weeks for ROS, may or may not get Depo

## 2023-09-24 ENCOUNTER — Emergency Department (HOSPITAL_BASED_OUTPATIENT_CLINIC_OR_DEPARTMENT_OTHER): Payer: PRIVATE HEALTH INSURANCE

## 2023-09-24 ENCOUNTER — Encounter (HOSPITAL_BASED_OUTPATIENT_CLINIC_OR_DEPARTMENT_OTHER): Payer: Self-pay | Admitting: Emergency Medicine

## 2023-09-24 ENCOUNTER — Emergency Department (HOSPITAL_BASED_OUTPATIENT_CLINIC_OR_DEPARTMENT_OTHER)
Admission: EM | Admit: 2023-09-24 | Discharge: 2023-09-24 | Disposition: A | Discharge status: Home / Self Care | Payer: PRIVATE HEALTH INSURANCE | Attending: Emergency Medicine | Admitting: Emergency Medicine

## 2023-09-24 ENCOUNTER — Other Ambulatory Visit: Payer: Self-pay

## 2023-09-24 DIAGNOSIS — I10 Essential (primary) hypertension: Secondary | ICD-10-CM | POA: Insufficient documentation

## 2023-09-24 DIAGNOSIS — R109 Unspecified abdominal pain: Secondary | ICD-10-CM | POA: Diagnosis present

## 2023-09-24 DIAGNOSIS — R1013 Epigastric pain: Secondary | ICD-10-CM | POA: Insufficient documentation

## 2023-09-24 DIAGNOSIS — Z91040 Latex allergy status: Secondary | ICD-10-CM | POA: Insufficient documentation

## 2023-09-24 LAB — COMPREHENSIVE METABOLIC PANEL WITH GFR
ALT: 86 U/L — ABNORMAL HIGH (ref 0–44)
AST: 65 U/L — ABNORMAL HIGH (ref 15–41)
Albumin: 4.4 g/dL (ref 3.5–5.0)
Alkaline Phosphatase: 107 U/L (ref 38–126)
Anion gap: 13 (ref 5–15)
BUN: 11 mg/dL (ref 6–20)
CO2: 19 mmol/L — ABNORMAL LOW (ref 22–32)
Calcium: 10.1 mg/dL (ref 8.9–10.3)
Chloride: 107 mmol/L (ref 98–111)
Creatinine, Ser: 0.6 mg/dL (ref 0.44–1.00)
GFR, Estimated: >60 mL/min (ref >60)
Glucose, Bld: 93 mg/dL (ref 70–99)
Potassium: 3.9 mmol/L (ref 3.5–5.1)
Sodium: 139 mmol/L (ref 135–145)
Total Bilirubin: 0.7 mg/dL (ref 0.0–1.2)
Total Protein: 8.2 g/dL — ABNORMAL HIGH (ref 6.5–8.1)

## 2023-09-24 LAB — CBC
HCT: 42.7 % (ref 36.0–46.0)
Hemoglobin: 14.1 g/dL (ref 12.0–15.0)
MCH: 29.2 pg (ref 26.0–34.0)
MCHC: 33 g/dL (ref 30.0–36.0)
MCV: 88.4 fL (ref 80.0–100.0)
Platelets: 211 K/uL (ref 150–400)
RBC: 4.83 MIL/uL (ref 3.87–5.11)
RDW: 13.2 % (ref 11.5–15.5)
WBC: 4.4 K/uL (ref 4.0–10.5)
nRBC: 0 % (ref 0.0–0.2)

## 2023-09-24 LAB — URINALYSIS, ROUTINE W REFLEX MICROSCOPIC
Bilirubin Urine: NEGATIVE
Glucose, UA: NEGATIVE mg/dL
Hgb urine dipstick: NEGATIVE
Ketones, ur: NEGATIVE mg/dL
Leukocytes,Ua: NEGATIVE
Nitrite: NEGATIVE
Protein, ur: NEGATIVE mg/dL
Specific Gravity, Urine: 1.017 (ref 1.005–1.030)
pH: 7.5 (ref 5.0–8.0)

## 2023-09-24 LAB — PREGNANCY, URINE: Preg Test, Ur: NEGATIVE

## 2023-09-24 LAB — LIPASE, BLOOD: Lipase: 27 U/L (ref 11–51)

## 2023-09-24 MED ORDER — IOHEXOL 300 MG/ML  SOLN
100.0000 mL | Freq: Once | INTRAMUSCULAR | Status: AC | PRN
  Administered 2023-09-24: 100 mL via INTRAVENOUS

## 2023-09-24 MED ORDER — ALUM & MAG HYDROXIDE-SIMETH 200-200-20 MG/5ML PO SUSP
30.0000 mL | Freq: Once | ORAL | Status: AC
  Administered 2023-09-24: 30 mL via ORAL
  Filled 2023-09-24: qty 30

## 2023-09-24 MED ORDER — DICYCLOMINE HCL 20 MG PO TABS: 20.0000 mg | ORAL_TABLET | Freq: Two times a day (BID) | ORAL | 0 refills | Status: DC

## 2023-09-24 MED ORDER — PANTOPRAZOLE SODIUM 20 MG PO TBEC: 20.0000 mg | DELAYED_RELEASE_TABLET | Freq: Every day | ORAL | 0 refills | Status: DC

## 2023-09-24 NOTE — Discharge Instructions (Signed)
 You have been evaluated for your symptoms.  Fortunately CT scan today did not show any concerning finding.  Your blood work overall reassuring.  The pain is likely gastritis which is inflammation of your stomach.  Take medication prescribed and you may follow-up closely with GI specialist for outpatient evaluation management.

## 2023-09-24 NOTE — ED Triage Notes (Signed)
 Epigastric pain x 3 months Wakes from sleep with pain Nausea  And  greasy stools

## 2023-09-24 NOTE — ED Provider Notes (Signed)
 [place] EMERGENCY DEPARTMENT AT [facility] Provider Note   CSN: [id number] Arrival date & time: 09/24/23  1255     Patient presents with: Abdominal Pain   Katie Price is a 23 y.o. female.   The history is provided by the patient and medical records. No language interpreter was used.  Abdominal Pain    23 year old female with significant history of obesity, bipolar, hypertension presenting with complaint of abdominal pain.  Patient states for the past several months she has had recurrent upper abdominal pain.  Pain is described as a crampy sharp sensation which sometimes wakes her up from sleep.  Pain associate with nausea and occasional vomiting and sometimes having greasy looking stool.  Pain usually brought on by eating especially greasy food.  She felt that the past several weeks intensity has increased.  She does not endorse any fever chills no chest pain shortness of breath or productive cough no urinary symptoms.  She has an IUD her last menstruation was 08/01/2023.  Prior to Admission medications   Medication Sig Start Date End Date Taking? Authorizing Provider  albuterol  (PROVENTIL ) (2.5 MG/3ML) 0.083% nebulizer solution inhale the contents of 1 vial (3ml) by nebulization 3 (three) times daily. 01/27/22     buPROPion  (WELLBUTRIN  XL) 150 MG 24 hr tablet Take 1 tablet (150 mg total) by mouth daily. 02/17/23   Rankin, Shuvon B, NP  busPIRone  (BUSPAR ) 7.5 MG tablet Take 1 tablet (7.5 mg total) by mouth 3 (three) times daily. 02/17/23   Rankin, Shuvon B, NP  diphenhydrAMINE (BENADRYL) 25 mg capsule Take 25 mg by mouth every 6 (six) hours as needed.    [provider]  fexofenadine (ALLEGRA) 180 MG tablet Take 180 mg by mouth daily.    [provider]  hydrOXYzine  (VISTARIL ) 25 MG capsule Take 1 capsule (25 mg total) by mouth 3 (three) times daily. 02/17/23   Rankin, Shuvon B, NP  medroxyPROGESTERone  (DEPO-PROVERA ) 150 MG/ML injection Inject 1 mL (150 mg total)  into the muscle every 3 (three) months. 09/23/23   Signa Delon LABOR, NP  Multiple Vitamin (MULTIVITAMIN) tablet Take 1 tablet by mouth daily.    [provider]  ondansetron  (ZOFRAN ) 4 MG tablet Take 1 tablet (4 mg total) by mouth every 8 (eight) hours as needed. 12/27/22   Signa Delon LABOR, NP  triamcinolone  cream (KENALOG ) 0.1 % Apply topically to affected area 2 (two) times daily. 02/10/22     VENTOLIN  HFA 108 (90 Base) MCG/ACT inhaler Inhale 2 puffs into the lungs every 6 (six) hours as needed. 02/26/21   [provider]  WEGOVY  0.5 MG/0.5ML SOAJ SQ injection Inject 0.5 mg into the skin. 08/31/23   [provider]    Allergies: Adhesive [tape], Latex, Morphine, and Phentermine    Review of Systems  Gastrointestinal:  Positive for abdominal pain.  All other systems reviewed and are negative.   Updated Vital Signs BP (!) 134/98 (BP Location: Right Arm)   Pulse 97   Temp 98.3 F (36.8 C) (Oral)   Resp 16   LMP 08/01/2023   SpO2 98%   Physical Exam Vitals and nursing note reviewed.  Constitutional:      General: She is not in acute distress.    Appearance: She is well-developed. She is obese.     Comments: Resting comfortably appears to be in no acute discomfort.  HENT:     Head: Atraumatic.  Eyes:     Conjunctiva/sclera: Conjunctivae normal.  Cardiovascular:  Rate and Rhythm: Normal rate and regular rhythm.     Pulses: Normal pulses.     Heart sounds: Normal heart sounds.  Pulmonary:     Effort: Pulmonary effort is normal.  Abdominal:     Palpations: Abdomen is soft.     Tenderness: There is abdominal tenderness (Tenderness to right upper quadrant and epigastric and left upper quadrant on palpation without guarding or rebound tenderness.). There is no right CVA tenderness or left CVA tenderness.  Musculoskeletal:     Cervical back: Neck supple.  Skin:    Findings: No rash.  Neurological:     Mental Status: She is alert.  Psychiatric:         Mood and Affect: Mood normal.     (all labs ordered are listed, but only abnormal results are displayed) Labs Reviewed  COMPREHENSIVE METABOLIC PANEL WITH GFR - Abnormal; Notable for the following components:      Result Value   CO2 19 (*)    Total Protein 8.2 (*)    AST 65 (*)    ALT 86 (*)    All other components within normal limits  LIPASE, BLOOD  CBC  URINALYSIS, ROUTINE W REFLEX MICROSCOPIC  PREGNANCY, URINE    EKG: None  Radiology: CT ABDOMEN PELVIS W CONTRAST Result Date: 09/24/2023 EXAM: CT ABDOMEN AND PELVIS WITH CONTRAST 09/24/2023 03:04:07 PM TECHNIQUE: CT of the abdomen and pelvis was performed with the administration of intravenous contrast. Multiplanar reformatted images are provided for review. Automated exposure control, iterative reconstruction, and/or weight-based adjustment of the mA/kV was utilized to reduce the radiation dose to as low as reasonably achievable. Scan was delayed and acquired at around 3 minute mark due to patient emesis from contrast injection at 50 seconds after injection start. COMPARISON: 06/17/2020 report from [facility]. CLINICAL HISTORY: Cholecystitis (Ped 0-17y). Pt threw up from contrast injection at 50 seconds after injection start. Scan was delayed and acquired at around 3 minute mark. FINDINGS: LOWER CHEST: Left base scarring. LIVER: Minimal exclusion of the hepatic dome. Normal size and contour. GALLBLADDER AND BILE DUCTS: No calcified gallstone. No pericholecystic edema. No biliary duct dilatation. SPLEEN: Normal size. No focal lesion. PANCREAS: No mass. No ductal dilatation. ADRENAL GLANDS: Normal appearance. No mass. KIDNEYS, URETERS AND BLADDER: No stones in the kidneys or ureters. No hydronephrosis. No perinephric or periureteral stranding. Urinary bladder is unremarkable. GI AND BOWEL: Normal appendix including on coronal image 63. There is no bowel obstruction. No bowel wall thickening. Normal stomach. PERITONEUM AND RETROPERITONEUM: No  ascites. No free air. VASCULATURE: Aorta is normal in caliber. Suboptimal contrast opacification secondary to delayed acquisition after emesis with injection. LYMPH NODES: Multiple small ileocolic mesenteric nodes, including on 49/2. REPRODUCTIVE ORGANS: Suspect a right ovarian corpus luteal cyst of 1.6 cm on image 72/2. BONES AND SOFT TISSUES: No acute osseous abnormality. No focal soft tissue abnormality. IMPRESSION: 1. No acute findings of cholecystitis. Evaluation limited by suboptimal contrast opacification and minimal exclusion of the hepatic dome. 2. Multiple small ileocolic mesenteric nodes, likely reactive . 3. Suspected right ovarian corpus luteal cyst, measuring 1.6 cm. Electronically signed by: Rockey Kilts MD 09/24/2023 03:46 PM EDT RP Workstation: [id number]     Procedures   Medications Ordered in the ED  alum & mag hydroxide-simeth (MAALOX/MYLANTA) 200-200-20 MG/5ML suspension 30 mL (30 mLs Oral Given 09/24/23 1428)  iohexol  (OMNIPAQUE ) 300 MG/ML solution 100 mL (100 mLs Intravenous Contrast Given 09/24/23 1453)  Medical Decision Making Amount and/or Complexity of Data Reviewed Labs: ordered. Radiology: ordered.  Risk OTC drugs. Prescription drug management.   BP (!) 134/98 (BP Location: Right Arm)   Pulse 97   Temp 98.3 F (36.8 C) (Oral)   Resp 16   LMP 08/01/2023   SpO2 98%   1:5 PM  23 year old female with significant history of obesity, bipolar, hypertension presenting with complaint of abdominal pain.  Patient states for the past several months she has had recurrent upper abdominal pain.  Pain is described as a crampy sharp sensation which sometimes wakes her up from sleep.  Pain associate with nausea and occasional vomiting and sometimes having greasy looking stool.  Pain usually brought on by eating especially greasy food.  She felt that the past several weeks intensity has increased.  She does not endorse any fever chills no  chest pain shortness of breath or productive cough no urinary symptoms.  She has an IUD her last menstruation was 08/01/2023.  On exam patient is resting comfortably appears to be in no acute discomfort.  She does have tenderness to right upper quadrant, epigastric, and left upper quadrant on palpation without guarding or rebound tenderness.  Bowel sounds are present no CVA tenderness.  GI cocktail given, will obtain abdominal pelvis CT scan as well as further assessment.  -Labs ordered, independently viewed and interpreted by me.  Labs remarkable for mild transaminitis with AST 65, ALT 86.  Normal lipase, normal WBC, normal H&H -The patient was maintained on a cardiac monitor.  I personally viewed and interpreted the cardiac monitored which showed an underlying rhythm of: sinus rhythm -Imaging independently viewed and interpreted by me and I agree with radiologist's interpretation.  Result remarkable for abd/pelvis CT without acute finding -This patient presents to the ED for concern of abd pain, this involves an extensive number of treatment options, and is a complaint that carries with it a high risk of complications and morbidity.  The differential diagnosis includes gastritis, pancreatitis, cholecystitis, UTI, appendicitis, colitis -Co morbidities that complicate the patient evaluation includes obesity, HTN, bipolar -Treatment includes gi cocktail -Reevaluation of the patient after these medicines showed that the patient improved -PCP office notes or outside notes reviewed -Escalation to admission/observation considered: patients feels much better, is comfortable with discharge, and will follow up with GI -Prescription medication considered, patient comfortable with protonix  and bentyl  -Social Determinant of Health considered which includes depression, stress      Final diagnoses:  Epigastric pain    ED Discharge Orders          Ordered    pantoprazole  (PROTONIX ) 20 MG tablet  Daily         09/24/23 1706    dicyclomine  (BENTYL ) 20 MG tablet  2 times daily        09/24/23 1706               Nivia Colon, PA-C 09/24/23 1707    Elnor Jayson LABOR, DO 09/25/23 1252

## 2023-09-29 LAB — CYTOLOGY - PAP
Chlamydia: NEGATIVE
Comment: NEGATIVE
Comment: NEGATIVE
Comment: NORMAL
Diagnosis: NEGATIVE
Diagnosis: REACTIVE
High risk HPV: NEGATIVE
Neisseria Gonorrhea: NEGATIVE

## 2023-09-30 ENCOUNTER — Ambulatory Visit: Payer: Self-pay | Admitting: Adult Health

## 2023-10-06 ENCOUNTER — Other Ambulatory Visit (HOSPITAL_COMMUNITY): Payer: Self-pay | Admitting: Gastroenterology

## 2023-10-06 DIAGNOSIS — R1011 Right upper quadrant pain: Secondary | ICD-10-CM

## 2023-10-08 ENCOUNTER — Ambulatory Visit (HOSPITAL_COMMUNITY)
Admission: RE | Admit: 2023-10-08 | Discharge: 2023-10-08 | Disposition: A | Discharge status: Home / Self Care | Payer: PRIVATE HEALTH INSURANCE | Source: Ambulatory Visit | Attending: Gastroenterology | Admitting: Gastroenterology

## 2023-10-08 DIAGNOSIS — R1011 Right upper quadrant pain: Secondary | ICD-10-CM | POA: Diagnosis present

## 2023-10-14 ENCOUNTER — Ambulatory Visit (HOSPITAL_COMMUNITY)
Admission: RE | Admit: 2023-10-14 | Discharge: 2023-10-14 | Disposition: A | Discharge status: Home / Self Care | Payer: PRIVATE HEALTH INSURANCE | Source: Ambulatory Visit | Attending: Gastroenterology | Admitting: Gastroenterology

## 2023-10-14 DIAGNOSIS — R1011 Right upper quadrant pain: Secondary | ICD-10-CM | POA: Insufficient documentation

## 2023-10-14 MED ORDER — TECHNETIUM TC 99M MEBROFENIN IV KIT
5.0000 | PACK | Freq: Once | INTRAVENOUS | Status: AC
  Administered 2023-10-14: 5.3 via INTRAVENOUS

## 2023-10-19 ENCOUNTER — Ambulatory Visit: Payer: PRIVATE HEALTH INSURANCE | Admitting: Women's Health

## 2023-10-26 ENCOUNTER — Other Ambulatory Visit (HOSPITAL_COMMUNITY): Payer: Self-pay | Admitting: Gastroenterology

## 2023-10-26 DIAGNOSIS — R935 Abnormal findings on diagnostic imaging of other abdominal regions, including retroperitoneum: Secondary | ICD-10-CM

## 2023-10-29 ENCOUNTER — Other Ambulatory Visit: Payer: Self-pay

## 2023-10-29 ENCOUNTER — Ambulatory Visit
Admission: EM | Admit: 2023-10-29 | Discharge: 2023-10-29 | Disposition: A | Discharge status: Home / Self Care | Payer: PRIVATE HEALTH INSURANCE | Attending: Family Medicine | Admitting: Family Medicine

## 2023-10-29 ENCOUNTER — Encounter: Payer: Self-pay | Admitting: Emergency Medicine

## 2023-10-29 DIAGNOSIS — Z113 Encounter for screening for infections with a predominantly sexual mode of transmission: Secondary | ICD-10-CM | POA: Insufficient documentation

## 2023-10-29 DIAGNOSIS — N760 Acute vaginitis: Secondary | ICD-10-CM | POA: Diagnosis present

## 2023-10-29 LAB — POCT URINE DIPSTICK
Bilirubin, UA: NEGATIVE
Blood, UA: NEGATIVE
Glucose, UA: NEGATIVE mg/dL
Ketones, POC UA: NEGATIVE mg/dL
Leukocytes, UA: NEGATIVE
Nitrite, UA: NEGATIVE
Spec Grav, UA: >=1.03 — AB (ref 1.010–1.025)
Urobilinogen, UA: 1 U/dL
pH, UA: 6.5 (ref 5.0–8.0)

## 2023-10-29 LAB — POCT URINE PREGNANCY: Preg Test, Ur: NEGATIVE

## 2023-10-29 NOTE — ED Triage Notes (Addendum)
 Pt reports dysuria, vaginal itching, fever since yesterday. Reports has unprotected intercourse x2 days ago. Pt inquiring about UTI and STD testing.   Self swab obtained post triage.

## 2023-10-29 NOTE — Discharge Instructions (Signed)
 Your urinalysis today does not show evidence of a urinary tract infection.  We have obtained HIV and syphilis labs as well as a vaginal swab to check for multiple possible vaginal infections and we will be in touch with these results.  In the meantime, avoid any scented soaps, feminine wipes or washes, and you may use probiotics, vaginal boric acid suppositories daily as needed

## 2023-10-29 NOTE — ED Provider Notes (Signed)
 [facility]    CSN: [id number] Arrival date & time: 10/29/23  0946      History   Chief Complaint Chief Complaint  Patient presents with   Dysuria    HPI Katie Price is a 23 y.o. female.   Patient presenting today with 2-day history of dysuria, vaginal itching, low-grade fever.  Denies pelvic or abdominal pain, nausea, vomiting, hematuria, bowel changes, upper respiratory symptoms.  So far not try anything over-the-counter for symptoms.  Requesting STD screening as she has recently had unprotected intercourse.    Past Medical History:  Diagnosis Date   Anxiety and depression 09/04/2021   Asthma    Bipolar 1 disorder (HCC) 04/10/2016   Bipolar 1 disorder, depressed, moderate (HCC) 05/14/2014   Bipolar disorder (HCC)    Encounter for gynecological examination with Papanicolaou smear of cervix 07/22/2020   Encounter for initial prescription of contraceptive pills 06/04/2020   Encounter for initial prescription of contraceptives 06/04/2020   Encounter for initial prescription of vaginal ring hormonal contraceptive 11/01/2019   Failed vision screen 04/12/2014   General counseling and advice on contraceptive management 11/10/2020   Hypertension    MDD (major depressive disorder) 09/12/2016   PE (pulmonary thromboembolism) (HCC)    Pregnancy examination or test, negative result 06/15/2017   Screening examination for STD (sexually transmitted disease) 06/15/2017   Tonsillar and adenoid hypertrophy 07/2016   snores during sleep, denies apnea    Patient Active Problem List   Diagnosis Date Noted   Encounter for IUD removal 09/23/2023   Negative pregnancy test 09/23/2023   Pelvic cramping 09/23/2023   Routine Papanicolaou smear 09/23/2023   Encounter for initial prescription of injectable contraceptive 09/23/2023   Contraceptive management 09/23/2023   Trichomoniasis 04/07/2023   Hypertension 03/28/2023   GAD (generalized anxiety disorder) 02/17/2023   MDD  (major depressive disorder), recurrent episode, moderate (HCC) 02/17/2023   Anxiety and depression 11/15/2022   Anxiety 09/06/2022   PTSD (post-traumatic stress disorder) 10/20/2021   Generalized anxiety disorder 10/20/2021   Circadian rhythm sleep disorder, delayed sleep phase type 10/20/2021   Elevated BP without diagnosis of hypertension 09/04/2021   Polypoid sinus degeneration 07/02/2020   Anaphylactic shock due to adverse food reaction 07/02/2020   Seasonal and perennial allergic rhinitis 07/02/2020   Mild persistent asthma, uncomplicated 07/02/2020   History of pulmonary embolism 06/04/2020   Irregular bleeding 11/01/2019   Menorrhagia with irregular cycle 06/15/2017   Obesity 04/12/2014   Eczema 04/12/2014   Seasonal allergies 04/12/2014   Acanthosis nigricans 04/12/2014    Past Surgical History:  Procedure Laterality Date   ETHMOIDECTOMY Right 03/31/2020   Procedure: RIGHT TOTAL ETHMOIDECTOMY AND FRONTAL RECESS exploration;  Surgeon: Karis Clunes, MD;  Location: [facility];  Service: ENT;  Laterality: Right;   MAXILLARY ANTROSTOMY Bilateral 03/31/2020   Procedure: ENDOSCOPIC MAXILLARY ANTROSTOMY WITH TISSUE REMOVAL;  Surgeon: Karis Clunes, MD;  Location: [facility];  Service: ENT;  Laterality: Bilateral;   SINUS ENDO WITH FUSION Bilateral 03/31/2020   Procedure: SINUS ENDOSCOPY WITH FUSION NAVIGATION;  Surgeon: Karis Clunes, MD;  Location: [facility];  Service: ENT;  Laterality: Bilateral;   TONSILLECTOMY AND ADENOIDECTOMY N/A 08/02/2016   Procedure: TONSILLECTOMY AND ADENOIDECTOMY;  Surgeon: Karis Clunes, MD;  Location: [facility];  Service: ENT;  Laterality: N/A;   TURBINATE REDUCTION Bilateral 03/31/2020   Procedure: TURBINATE REDUCTION;  Surgeon: Karis Clunes, MD;  Location:  SURGERY CENTER;  Service: ENT;  Laterality: Bilateral;  WISDOM TOOTH EXTRACTION      OB History     Gravida  1   Para      Term      Preterm       AB  1   Living         SAB      IAB      Ectopic      Multiple      Live Births               Home Medications    Prior to Admission medications   Medication Sig Start Date End Date Taking? Authorizing Provider  albuterol  (PROVENTIL ) (2.5 MG/3ML) 0.083% nebulizer solution inhale the contents of 1 vial (3ml) by nebulization 3 (three) times daily. 01/27/22     buPROPion  (WELLBUTRIN  XL) 150 MG 24 hr tablet Take 1 tablet (150 mg total) by mouth daily. 02/17/23   Rankin, Shuvon B, NP  busPIRone  (BUSPAR ) 7.5 MG tablet Take 1 tablet (7.5 mg total) by mouth 3 (three) times daily. 02/17/23   Rankin, Shuvon B, NP  dicyclomine  (BENTYL ) 20 MG tablet Take 1 tablet (20 mg total) by mouth 2 (two) times daily. 09/24/23   Nivia Colon, PA-C  diphenhydrAMINE (BENADRYL) 25 mg capsule Take 25 mg by mouth every 6 (six) hours as needed.    [provider]  fexofenadine (ALLEGRA) 180 MG tablet Take 180 mg by mouth daily.    [provider]  hydrOXYzine  (VISTARIL ) 25 MG capsule Take 1 capsule (25 mg total) by mouth 3 (three) times daily. 02/17/23   Rankin, Shuvon B, NP  medroxyPROGESTERone  (DEPO-PROVERA ) 150 MG/ML injection Inject 1 mL (150 mg total) into the muscle every 3 (three) months. 09/23/23   Signa Delon LABOR, NP  Multiple Vitamin (MULTIVITAMIN) tablet Take 1 tablet by mouth daily.    [provider]  ondansetron  (ZOFRAN ) 4 MG tablet Take 1 tablet (4 mg total) by mouth every 8 (eight) hours as needed. 12/27/22   Signa Delon LABOR, NP  pantoprazole  (PROTONIX ) 20 MG tablet Take 1 tablet (20 mg total) by mouth daily. 09/24/23   Nivia Colon, PA-C  triamcinolone  cream (KENALOG ) 0.1 % Apply topically to affected area 2 (two) times daily. 02/10/22     VENTOLIN  HFA 108 (90 Base) MCG/ACT inhaler Inhale 2 puffs into the lungs every 6 (six) hours as needed. 02/26/21   [provider]  WEGOVY  0.5 MG/0.5ML SOAJ SQ injection Inject 1 mg into the skin. 08/31/23   [provider]    Family History Family History  Problem Relation Age of Onset   Hypertension Mother    Cancer Mother        cervical   Diabetes Mother    Other Maternal Grandmother        pre diabetic   Other Paternal Grandmother        brain tumor    Social History Social History   Tobacco Use   Smoking status: Former    Types: Cigarettes   Smokeless tobacco: Never  Vaping Use   Vaping status: Never Used  Substance Use Topics   Alcohol use: Yes    Comment: once or twice a week socially   Drug use: Not Currently    Types: Marijuana    Comment: occassionally     Allergies   Adhesive [tape], Latex, Morphine, and Phentermine   Review of Systems Review of Systems Per HPI  Physical Exam Triage Vital Signs ED Triage Vitals  Encounter  Vitals Group     BP 10/29/23 0954 (!) 148/76     Girls Systolic BP Percentile --      Girls Diastolic BP Percentile --      Boys Systolic BP Percentile --      Boys Diastolic BP Percentile --      Pulse Rate 10/29/23 0954 95     Resp 10/29/23 0954 20     Temp 10/29/23 0954 98.1 F (36.7 C)     Temp Source 10/29/23 0954 Oral     SpO2 10/29/23 0954 98 %     Weight --      Height --      Head Circumference --      Peak Flow --      Pain Score 10/29/23 0953 0     Pain Loc --      Pain Education --      Exclude from Growth Chart --    No data found.  Updated Vital Signs BP (!) 148/76 (BP Location: Right Arm)   Pulse 95   Temp 98.1 F (36.7 C) (Oral)   Resp 20   LMP 09/26/2023 (Approximate)   SpO2 98%   Visual Acuity Right Eye Distance:   Left Eye Distance:   Bilateral Distance:    Right Eye Near:   Left Eye Near:    Bilateral Near:     Physical Exam Vitals and nursing note reviewed.  Constitutional:      Appearance: Normal appearance. She is not ill-appearing.  HENT:     Head: Atraumatic.  Eyes:     Extraocular Movements: Extraocular movements intact.     Conjunctiva/sclera: Conjunctivae normal.   Cardiovascular:     Rate and Rhythm: Normal rate.  Pulmonary:     Effort: Pulmonary effort is normal.  Abdominal:     General: Bowel sounds are normal. There is no distension.     Palpations: Abdomen is soft.     Tenderness: There is no abdominal tenderness. There is no right CVA tenderness, left CVA tenderness or guarding.  Genitourinary:    Comments: GU exam deferred, self swab performed Musculoskeletal:        General: Normal range of motion.     Cervical back: Normal range of motion and neck supple.  Skin:    General: Skin is warm and dry.  Neurological:     Mental Status: She is alert and oriented to person, place, and time.  Psychiatric:        Mood and Affect: Mood normal.        Thought Content: Thought content normal.        Judgment: Judgment normal.      UC Treatments / Results  Labs (all labs ordered are listed, but only abnormal results are displayed) Labs Reviewed  POCT URINE DIPSTICK - Abnormal; Notable for the following components:      Result Value   Spec Grav, UA >=1.030 (*)    All other components within normal limits  RPR  HIV ANTIBODY (ROUTINE TESTING W REFLEX)  POCT URINE PREGNANCY  CERVICOVAGINAL ANCILLARY ONLY    EKG   Radiology No results found.  Procedures Procedures (including critical care time)  Medications Ordered in UC Medications - No data to display  Initial Impression / Assessment and Plan / UC Course  I have reviewed the triage vital signs and the nursing notes.  Pertinent labs & imaging results that were available during my care of the patient were reviewed by me  and considered in my medical decision making (see chart for details).     Urinalysis today without evidence of urinary tract infection.  Vaginal swab and HIV and syphilis labs pending, treat based on results.  Discussed supportive over-the-counter medications, home care and return precautions.  Final Clinical Impressions(s) / UC Diagnoses   Final diagnoses:   Screening examination for STI  Acute vaginitis     Discharge Instructions      Your urinalysis today does not show evidence of a urinary tract infection.  We have obtained HIV and syphilis labs as well as a vaginal swab to check for multiple possible vaginal infections and we will be in touch with these results.  In the meantime, avoid any scented soaps, feminine wipes or washes, and you may use probiotics, vaginal boric acid suppositories daily as needed    ED Prescriptions   None    PDMP not reviewed this encounter.   Stuart Vernell Norris, [place] 10/29/23 1055

## 2023-10-31 ENCOUNTER — Ambulatory Visit (HOSPITAL_COMMUNITY): Payer: Self-pay

## 2023-10-31 LAB — CERVICOVAGINAL ANCILLARY ONLY
Bacterial Vaginitis (gardnerella): POSITIVE — AB
Candida Glabrata: NEGATIVE
Candida Vaginitis: POSITIVE — AB
Chlamydia: NEGATIVE
Comment: NEGATIVE
Comment: NEGATIVE
Comment: NEGATIVE
Comment: NEGATIVE
Comment: NEGATIVE
Comment: NORMAL
Neisseria Gonorrhea: NEGATIVE
Trichomonas: NEGATIVE

## 2023-10-31 MED ORDER — METRONIDAZOLE 500 MG PO TABS: 500.0000 mg | ORAL_TABLET | Freq: Two times a day (BID) | ORAL | 0 refills | Status: AC

## 2023-10-31 MED ORDER — FLUCONAZOLE 150 MG PO TABS: 150.0000 mg | ORAL_TABLET | Freq: Once | ORAL | 0 refills | Status: AC

## 2023-11-01 LAB — RPR: RPR Ser Ql: NONREACTIVE

## 2023-11-01 LAB — HIV ANTIBODY (ROUTINE TESTING W REFLEX): HIV Screen 4th Generation wRfx: NONREACTIVE

## 2023-11-10 ENCOUNTER — Encounter (HOSPITAL_COMMUNITY): Payer: Self-pay

## 2023-11-10 ENCOUNTER — Ambulatory Visit (HOSPITAL_COMMUNITY): Payer: PRIVATE HEALTH INSURANCE

## 2023-11-20 ENCOUNTER — Other Ambulatory Visit: Payer: Self-pay

## 2023-11-20 ENCOUNTER — Emergency Department (HOSPITAL_BASED_OUTPATIENT_CLINIC_OR_DEPARTMENT_OTHER): Payer: PRIVATE HEALTH INSURANCE

## 2023-11-20 ENCOUNTER — Encounter (HOSPITAL_BASED_OUTPATIENT_CLINIC_OR_DEPARTMENT_OTHER): Payer: Self-pay

## 2023-11-20 ENCOUNTER — Emergency Department (HOSPITAL_BASED_OUTPATIENT_CLINIC_OR_DEPARTMENT_OTHER)
Admission: EM | Admit: 2023-11-20 | Discharge: 2023-11-20 | Disposition: A | Discharge status: Home / Self Care | Payer: PRIVATE HEALTH INSURANCE | Attending: Emergency Medicine | Admitting: Emergency Medicine

## 2023-11-20 DIAGNOSIS — X501XXA Overexertion from prolonged static or awkward postures, initial encounter: Secondary | ICD-10-CM | POA: Insufficient documentation

## 2023-11-20 DIAGNOSIS — S92152A Displaced avulsion fracture (chip fracture) of left talus, initial encounter for closed fracture: Secondary | ICD-10-CM | POA: Diagnosis not present

## 2023-11-20 DIAGNOSIS — Z91040 Latex allergy status: Secondary | ICD-10-CM | POA: Diagnosis not present

## 2023-11-20 DIAGNOSIS — M79672 Pain in left foot: Secondary | ICD-10-CM | POA: Diagnosis present

## 2023-11-20 DIAGNOSIS — S92252A Displaced fracture of navicular [scaphoid] of left foot, initial encounter for closed fracture: Secondary | ICD-10-CM | POA: Diagnosis not present

## 2023-11-20 MED ORDER — IBUPROFEN 400 MG PO TABS
600.0000 mg | ORAL_TABLET | Freq: Once | ORAL | Status: DC
  Filled 2023-11-20: qty 1

## 2023-11-20 NOTE — ED Triage Notes (Signed)
 Pt reports tripping and rolling L ankle. Pt has pain and swelling to L ankle.

## 2023-11-20 NOTE — Discharge Instructions (Addendum)
 Please read and follow all provided instructions.  Your diagnoses today include:  1. Sprain of left ankle, unspecified ligament, initial encounter     Tests performed today include: An x-ray of your ankle and foot: suggests two small avulsion fractures Vital signs. See below for your results today.   Medications prescribed:  Please use over-the-counter NSAID medications (ibuprofen , naproxen) or Tylenol  (acetaminophen ) as directed on the packaging for pain -- as long as you do not have any reasons avoid these medications. Reasons to avoid NSAID medications include: weak kidneys, a history of bleeding in your stomach or gut, or uncontrolled high blood pressure or previous heart attack. Reasons to avoid Tylenol  include: liver problems or ongoing alcohol use. Never take more than 4000mg  or 8 Extra strength Tylenol  in a 24 hour period.     Take any prescribed medications only as directed.  Home care instructions:  Follow any educational materials contained in this packet Follow R.I.C.E. Protocol: R - rest your injury  I  - use ice on injury without applying directly to skin C - compress injury with bandage or splint E - elevate the injury as much as possible  Follow-up instructions: Please follow-up with the provided orthopedic (bone specialist) in 5 days for follow-up of injury.   Return instructions:  Please return if your toes are numb or tingling, appear gray or blue, or you have severe pain (also elevate leg and loosen splint or wrap) Please return to the Emergency Department if you experience worsening symptoms.  Please return if you have any other emergent concerns.  Additional Information:  Your vital signs today were: BP (!) 124/92 (BP Location: Left Arm)   Pulse (!) 126   Temp 97.9 F (36.6 C)   Resp 16   Ht 5' 8 (1.727 m)   Wt 127 kg   LMP 09/26/2023 (Approximate)   SpO2 98%   BMI 42.57 kg/m  If your blood pressure (BP) was elevated above 135/85 this visit, please  have this repeated by your doctor within one month. --------------

## 2023-11-20 NOTE — ED Provider Notes (Signed)
 [facility] AT [facility] Provider Note   CSN: [id number] Arrival date & time: 11/20/23  8261     Patient presents with: Fall and Ankle Pain   Katie Price is a 23 y.o. female.   Patient presents to the emergency department today for evaluation of left ankle and foot pain and swelling.  She states that she tripped and rolled over on her ankle earlier in the day.  She denies knee or hip pain.  She did not hit her head.  No treatments prior to arrival.  She has been unable to bear weight due to pain.  She tried to elevated and take a nap, but woke up with persistent symptoms, prompting emergency department evaluation today.       Prior to Admission medications   Medication Sig Start Date End Date Taking? Authorizing Provider  albuterol  (PROVENTIL ) (2.5 MG/3ML) 0.083% nebulizer solution inhale the contents of 1 vial (3ml) by nebulization 3 (three) times daily. 01/27/22     buPROPion  (WELLBUTRIN  XL) 150 MG 24 hr tablet Take 1 tablet (150 mg total) by mouth daily. 02/17/23   Rankin, Shuvon B, NP  busPIRone  (BUSPAR ) 7.5 MG tablet Take 1 tablet (7.5 mg total) by mouth 3 (three) times daily. 02/17/23   Rankin, Shuvon B, NP  dicyclomine  (BENTYL ) 20 MG tablet Take 1 tablet (20 mg total) by mouth 2 (two) times daily. 09/24/23   Nivia Colon, PA-C  diphenhydrAMINE (BENADRYL) 25 mg capsule Take 25 mg by mouth every 6 (six) hours as needed.    [provider]  fexofenadine (ALLEGRA) 180 MG tablet Take 180 mg by mouth daily.    [provider]  hydrOXYzine  (VISTARIL ) 25 MG capsule Take 1 capsule (25 mg total) by mouth 3 (three) times daily. 02/17/23   Rankin, Shuvon B, NP  medroxyPROGESTERone  (DEPO-PROVERA ) 150 MG/ML injection Inject 1 mL (150 mg total) into the muscle every 3 (three) months. 09/23/23   Signa Delon LABOR, NP  Multiple Vitamin (MULTIVITAMIN) tablet Take 1 tablet by mouth daily.    [provider]  ondansetron  (ZOFRAN ) 4 MG tablet  Take 1 tablet (4 mg total) by mouth every 8 (eight) hours as needed. 12/27/22   Signa Delon LABOR, NP  pantoprazole  (PROTONIX ) 20 MG tablet Take 1 tablet (20 mg total) by mouth daily. 09/24/23   Nivia Colon, PA-C  triamcinolone  cream (KENALOG ) 0.1 % Apply topically to affected area 2 (two) times daily. 02/10/22     VENTOLIN  HFA 108 (90 Base) MCG/ACT inhaler Inhale 2 puffs into the lungs every 6 (six) hours as needed. 02/26/21   [provider]  WEGOVY  0.5 MG/0.5ML SOAJ SQ injection Inject 1 mg into the skin. 08/31/23   [provider]    Allergies: Adhesive [tape], Latex, Morphine, and Phentermine    Review of Systems  Updated Vital Signs BP (!) 124/92 (BP Location: Left Arm)   Pulse (!) 126   Temp 97.9 F (36.6 C)   Resp 16   Ht 5' 8 (1.727 m)   Wt 127 kg   LMP 09/26/2023 (Approximate)   SpO2 98%   BMI 42.57 kg/m   Physical Exam Vitals and nursing note reviewed.  Constitutional:      Appearance: She is well-developed.  HENT:     Head: Normocephalic and atraumatic.  Eyes:     Pupils: Pupils are equal, round, and reactive to light.  Cardiovascular:     Pulses: Normal pulses. No decreased pulses.  Musculoskeletal:  General: Tenderness present.     Cervical back: Normal range of motion and neck supple.     Left knee: No bony tenderness. Normal range of motion. No tenderness.     Left ankle: Swelling present. Tenderness present. No proximal fibula tenderness. Decreased range of motion.     Left foot: Swelling and tenderness present. No bony tenderness.  Skin:    General: Skin is warm and dry.  Neurological:     Mental Status: She is alert.     Sensory: No sensory deficit.     Comments: Motor, sensation, and vascular distal to the injury is fully intact.   Psychiatric:        Mood and Affect: Mood normal.     (all labs ordered are listed, but only abnormal results are displayed) Labs Reviewed - No data to display  EKG: None  Radiology: No  results found.   Procedures   Medications Ordered in the ED  ibuprofen  (ADVIL ) tablet 600 mg (has no administration in time range)   ED Course  Patient seen and examined. History obtained directly from patient.   Labs/EKG: None ordered  Imaging: Ordered x-ray left ankle and foot.  Medications/Fluids: Ordered: Ibuprofen .   Most recent vital signs reviewed and are as follows: BP (!) 124/92 (BP Location: Left Arm)   Pulse (!) 126   Temp 97.9 F (36.6 C)   Resp 16   Ht 5' 8 (1.727 m)   Wt 127 kg   LMP 09/26/2023 (Approximate)   SpO2 98%   BMI 42.57 kg/m   Initial impression: Ankle sprain  6:31 PM Reassessment performed. Patient appears stable.  Imaging personally visualized and interpreted including: Lateral view of x-ray of the foot to suggest 2 small avulsion fractures.  Reviewed pertinent lab work and imaging with patient at bedside. Questions answered.   Most current vital signs reviewed and are as follows: BP (!) 124/92 (BP Location: Left Arm)   Pulse (!) 126   Temp 97.9 F (36.6 C)   Resp 16   Ht 5' 8 (1.727 m)   Wt 127 kg   LMP 09/26/2023 (Approximate)   SpO2 98%   BMI 42.57 kg/m   Plan: Discharge to home.  Will give cam walker and crutches.  Prescriptions written for: None  Other home care instructions discussed: RICE protocol, OTC NSAIDs  ED return instructions discussed: Uncontrolled pain, new or worsening symptoms  Follow-up instructions discussed: Patient encouraged to follow-up with orthopedic referral.                                   Medical Decision Making Amount and/or Complexity of Data Reviewed Radiology: ordered.   Patient with ankle and foot injury after a fall today. Two small avulsion fractures suspected on x-ray.  Patient having difficulty bearing weight.  Will provide with cam walker and crutches.  Encouraged orthopedic follow-up.  Lower extremity is neurovascularly intact.     Final diagnoses:  Closed avulsion  fracture of navicular bone of left foot, initial encounter    ED Discharge Orders     None          Desiderio Chew, PA-C 11/20/23 1833    Mannie Pac T, DO 11/27/23 [phone]

## 2023-11-24 ENCOUNTER — Telehealth: Payer: PRIVATE HEALTH INSURANCE | Admitting: Physician Assistant

## 2023-11-24 DIAGNOSIS — J208 Acute bronchitis due to other specified organisms: Secondary | ICD-10-CM

## 2023-11-24 DIAGNOSIS — B9689 Other specified bacterial agents as the cause of diseases classified elsewhere: Secondary | ICD-10-CM | POA: Diagnosis not present

## 2023-11-24 MED ORDER — AZITHROMYCIN 250 MG PO TABS: ORAL_TABLET | ORAL | 0 refills | Status: AC

## 2023-11-24 MED ORDER — FLUCONAZOLE 150 MG PO TABS: ORAL_TABLET | ORAL | 0 refills | Status: DC

## 2023-11-24 MED ORDER — BENZONATATE 100 MG PO CAPS: 100.0000 mg | ORAL_CAPSULE | Freq: Three times a day (TID) | ORAL | 0 refills | Status: DC | PRN

## 2023-11-24 NOTE — Addendum Note (Signed)
 Addended by: GLADIS ELSIE BROCKS on: 11/24/2023 07:37 AM   Modules accepted: Orders

## 2023-11-24 NOTE — Progress Notes (Signed)
 We are sorry that you are not feeling well.  Here is how we plan to help!  Based on your presentation I believe you most likely have A cough due to bacteria.  When patients have a fever and a productive cough with a change in color or increased sputum production, we are concerned about bacterial bronchitis.  If left untreated it can progress to pneumonia.  If your symptoms do not improve with your treatment plan it is important that you contact your provider.   I have prescribed Azithromyin 250 mg: two tablets now and then one tablet daily for 4 additonal days    In addition you may use A prescription cough medication called Tessalon  Perles 100mg . You may take 1-2 capsules every 8 hours as needed for your cough.  From your responses in the eVisit questionnaire you describe inflammation in the upper respiratory tract which is causing a significant cough.  This is commonly called Bronchitis and has four common causes:   Allergies Viral Infections Acid Reflux Bacterial Infection Allergies, viruses and acid reflux are treated by controlling symptoms or eliminating the cause. An example might be a cough caused by taking certain blood pressure medications. You stop the cough by changing the medication. Another example might be a cough caused by acid reflux. Controlling the reflux helps control the cough.  USE OF BRONCHODILATOR (RESCUE) INHALERS: There is a risk from using your bronchodilator too frequently.  The risk is that over-reliance on a medication which only relaxes the muscles surrounding the breathing tubes can reduce the effectiveness of medications prescribed to reduce swelling and congestion of the tubes themselves.  Although you feel brief relief from the bronchodilator inhaler, your asthma may actually be worsening with the tubes becoming more swollen and filled with mucus.  This can delay other crucial treatments, such as oral steroid medications. If you need to use a bronchodilator inhaler  daily, several times per day, you should discuss this with your provider.  There are probably better treatments that could be used to keep your asthma under control.     HOME CARE Only take medications as instructed by your medical team. Complete the entire course of an antibiotic. Drink plenty of fluids and get plenty of rest. Avoid close contacts especially the very young and the elderly Cover your mouth if you cough or cough into your sleeve. Always remember to wash your hands A steam or ultrasonic humidifier can help congestion.   GET HELP RIGHT AWAY IF: You develop worsening fever. You become short of breath You cough up blood. Your symptoms persist after you have completed your treatment plan MAKE SURE YOU  Understand these instructions. Will watch your condition. Will get help right away if you are not doing well or get worse.  Your e-visit answers were reviewed by a board certified advanced clinical practitioner to complete your personal care plan.  Depending on the condition, your plan could have included both over the counter or prescription medications. If there is a problem please reply  once you have received a response from your provider. Your safety is important to us .  If you have drug allergies check your prescription carefully.    You can use MyChart to ask questions about today's visit, request a non-urgent call back, or ask for a work or school excuse for 24 hours related to this e-Visit. If it has been greater than 24 hours you will need to follow up with your provider, or enter a new e-Visit to  address those concerns. You will get an e-mail in the next two days asking about your experience.  I hope that your e-visit has been valuable and will speed your recovery. Thank you for using e-visits.   I have spent 5 minutes in review of e-visit questionnaire, review and updating patient chart, medical decision making and response to patient.   Elsie Velma Lunger,  PA-C

## 2023-12-01 ENCOUNTER — Encounter: Payer: Self-pay | Admitting: Physician Assistant

## 2023-12-01 ENCOUNTER — Ambulatory Visit (INDEPENDENT_AMBULATORY_CARE_PROVIDER_SITE_OTHER): Payer: PRIVATE HEALTH INSURANCE | Admitting: Physician Assistant

## 2023-12-01 DIAGNOSIS — S93401A Sprain of unspecified ligament of right ankle, initial encounter: Secondary | ICD-10-CM | POA: Diagnosis not present

## 2023-12-01 NOTE — Progress Notes (Signed)
 Office Visit Note   Patient: Katie Price           Date of Birth: Jul 02, 2000           MRN: [id number] Visit Date: 12/01/2023              Requested by: Leavy Waddell NOVAK, FNP [address] [place],  [place] [address] PCP: Leavy Waddell NOVAK, FNP  Chief Complaint  Patient presents with   Left Foot - Fracture      HPI: 23 y/o female slipped on a loose rug and feel with her left ankle being forcibly plantar flexed.  She was unable to put weight on her foot and went to the ER.  She was placed in a Cam boot for stability WBAT.  She has also been elevating to decrease the swelling.  She takes occasional tylenol  for pain.  She has returned to work as a Buyer, retail in [place].       Assessment & Plan: Visit Diagnoses:  1. Sprain of unspecified ligament of right ankle, initial encounter     Plan: Cam boot WBAT.  Perform active ROM of the ankle a few times a day to prevent stiffness.  May remove the boot for showers.  Elevate and Ice PRN.    Follow-Up Instructions: Return in about 3 weeks (around 12/22/2023).   Ortho Exam  Patient is alert, oriented, no adenopathy, well-dressed, normal affect, normal respiratory effort. Generalized tenderness over the anterior lateral ankle.  ATFL tenderness with a negative anterior drawer.  Dorsiflexion and plantarflexion full ROM.  Inversion and eversion increase discomfort.      Imaging: Reviewed  FINDINGS: Left foot: Tiny bony densities are seen along the dorsal aspect of the talus and navicular bones on lateral view, suggesting avulsion fractures. No dislocation is seen. Soft tissue swelling is present over the dorsum of the foot.   Left ankle: There is no evidence of acute fracture or dislocation. Soft tissue swelling is noted along the anterior aspect of the ankle.   IMPRESSION: Small avulsion fractures along the dorsal aspect of the talus and navicular bones.  Labs: Lab Results  Component Value Date   HGBA1C 5.5  09/15/2016   HGBA1C 5.4 04/11/2016   HGBA1C 5.6 05/10/2014   REPTSTATUS 08/27/2020 FINAL 08/24/2020   CULT  08/24/2020    NO GROWTH Performed at [facility], [address], [place], [place] [address]      Lab Results  Component Value Date   ALBUMIN 4.4 09/24/2023   ALBUMIN 3.8 07/31/2020   ALBUMIN 3.8 01/23/2019    No results found for: MG Lab Results  Component Value Date   VD25OH 16.7 (L) 04/13/2016   VD25OH 23 (L) 05/10/2014    No results found for: PREALBUMIN    Latest Ref Rng & Units 09/24/2023   12:59 PM 05/13/2021    5:02 PM 07/31/2020   11:00 AM  CBC EXTENDED  WBC 4.0 - 10.5 K/uL 4.4  4.6  5.3   RBC 3.87 - 5.11 MIL/uL 4.83  4.58  4.46   Hemoglobin 12.0 - 15.0 g/dL 85.8  86.6  87.5   HCT 36.0 - 46.0 % 42.7  40.2  39.7   Platelets 150 - 400 K/uL 211  200  216   NEUT# 1.7 - 7.7 K/uL  2.8  2.9   Lymph# 0.7 - 4.0 K/uL  1.3  2.0      There is no height or weight on file to calculate  BMI.  Orders:  No orders of the defined types were placed in this encounter.  No orders of the defined types were placed in this encounter.    Procedures: No procedures performed  Clinical Data: No additional findings.  ROS:  All other systems negative, except as noted in the HPI. Review of Systems  Objective: Vital Signs: There were no vitals taken for this visit.  Specialty Comments:  No specialty comments available.  PMFS History: Patient Active Problem List   Diagnosis Date Noted   Encounter for IUD removal 09/23/2023   Negative pregnancy test 09/23/2023   Pelvic cramping 09/23/2023   Routine Papanicolaou smear 09/23/2023   Encounter for initial prescription of injectable contraceptive 09/23/2023   Contraceptive management 09/23/2023   Trichomoniasis 04/07/2023   Hypertension 03/28/2023   GAD (generalized anxiety disorder) 02/17/2023   MDD (major depressive disorder), recurrent episode, moderate (HCC) 02/17/2023   Anxiety and depression  11/15/2022   Anxiety 09/06/2022   PTSD (post-traumatic stress disorder) 10/20/2021   Generalized anxiety disorder 10/20/2021   Circadian rhythm sleep disorder, delayed sleep phase type 10/20/2021   Elevated BP without diagnosis of hypertension 09/04/2021   Polypoid sinus degeneration 07/02/2020   Anaphylactic shock due to adverse food reaction 07/02/2020   Seasonal and perennial allergic rhinitis 07/02/2020   Mild persistent asthma, uncomplicated 07/02/2020   History of pulmonary embolism 06/04/2020   Irregular bleeding 11/01/2019   Menorrhagia with irregular cycle 06/15/2017   Obesity 04/12/2014   Eczema 04/12/2014   Seasonal allergies 04/12/2014   Acanthosis nigricans 04/12/2014   Past Medical History:  Diagnosis Date   Anxiety and depression 09/04/2021   Asthma    Bipolar 1 disorder (HCC) 04/10/2016   Bipolar 1 disorder, depressed, moderate (HCC) 05/14/2014   Bipolar disorder (HCC)    Encounter for gynecological examination with Papanicolaou smear of cervix 07/22/2020   Encounter for initial prescription of contraceptive pills 06/04/2020   Encounter for initial prescription of contraceptives 06/04/2020   Encounter for initial prescription of vaginal ring hormonal contraceptive 11/01/2019   Failed vision screen 04/12/2014   General counseling and advice on contraceptive management 11/10/2020   Hypertension    MDD (major depressive disorder) 09/12/2016   PE (pulmonary thromboembolism) (HCC)    Pregnancy examination or test, negative result 06/15/2017   Screening examination for STD (sexually transmitted disease) 06/15/2017   Tonsillar and adenoid hypertrophy 07/2016   snores during sleep, denies apnea    Family History  Problem Relation Age of Onset   Hypertension Mother    Cancer Mother        cervical   Diabetes Mother    Other Maternal Grandmother        pre diabetic   Other Paternal Grandmother        brain tumor    Past Surgical History:  Procedure Laterality  Date   ETHMOIDECTOMY Right 03/31/2020   Procedure: RIGHT TOTAL ETHMOIDECTOMY AND FRONTAL RECESS exploration;  Surgeon: Karis Clunes, MD;  Location: [facility];  Service: ENT;  Laterality: Right;   MAXILLARY ANTROSTOMY Bilateral 03/31/2020   Procedure: ENDOSCOPIC MAXILLARY ANTROSTOMY WITH TISSUE REMOVAL;  Surgeon: Karis Clunes, MD;  Location: [facility];  Service: ENT;  Laterality: Bilateral;   SINUS ENDO WITH FUSION Bilateral 03/31/2020   Procedure: SINUS ENDOSCOPY WITH FUSION NAVIGATION;  Surgeon: Karis Clunes, MD;  Location: [facility];  Service: ENT;  Laterality: Bilateral;   TONSILLECTOMY AND ADENOIDECTOMY N/A 08/02/2016   Procedure: TONSILLECTOMY AND ADENOIDECTOMY;  Surgeon: Karis,  Su, MD;  Location: [facility];  Service: ENT;  Laterality: N/A;   TURBINATE REDUCTION Bilateral 03/31/2020   Procedure: TURBINATE REDUCTION;  Surgeon: Karis Clunes, MD;  Location: [facility];  Service: ENT;  Laterality: Bilateral;   WISDOM TOOTH EXTRACTION     Social History   Occupational History   Not on file  Tobacco Use   Smoking status: Former    Types: Cigarettes   Smokeless tobacco: Never  Vaping Use   Vaping status: Never Used  Substance and Sexual Activity   Alcohol use: Yes    Comment: once or twice a week socially   Drug use: Not Currently    Types: Marijuana    Comment: occassionally   Sexual activity: Yes    Birth control/protection: Condom

## 2023-12-07 ENCOUNTER — Telehealth: Payer: PRIVATE HEALTH INSURANCE | Admitting: Family Medicine

## 2023-12-07 DIAGNOSIS — L309 Dermatitis, unspecified: Secondary | ICD-10-CM | POA: Diagnosis not present

## 2023-12-07 MED ORDER — TRIAMCINOLONE ACETONIDE 0.1 % EX CREA: 1.0000 | TOPICAL_CREAM | Freq: Two times a day (BID) | CUTANEOUS | 0 refills | Status: AC

## 2023-12-07 NOTE — Progress Notes (Signed)

## 2023-12-10 ENCOUNTER — Other Ambulatory Visit (HOSPITAL_COMMUNITY): Payer: Self-pay | Admitting: Gastroenterology

## 2023-12-10 ENCOUNTER — Ambulatory Visit (HOSPITAL_COMMUNITY)
Admission: RE | Admit: 2023-12-10 | Discharge: 2023-12-10 | Disposition: A | Payer: PRIVATE HEALTH INSURANCE | Source: Ambulatory Visit | Attending: Gastroenterology | Admitting: Gastroenterology

## 2023-12-10 DIAGNOSIS — R935 Abnormal findings on diagnostic imaging of other abdominal regions, including retroperitoneum: Secondary | ICD-10-CM | POA: Insufficient documentation

## 2023-12-10 MED ORDER — GADOBUTROL 1 MMOL/ML IV SOLN
10.0000 mL | Freq: Once | INTRAVENOUS | Status: AC | PRN
  Administered 2023-12-10: 10 mL via INTRAVENOUS

## 2023-12-14 ENCOUNTER — Encounter (HOSPITAL_COMMUNITY): Payer: Self-pay | Admitting: Internal Medicine

## 2023-12-14 ENCOUNTER — Other Ambulatory Visit: Payer: Self-pay

## 2023-12-14 ENCOUNTER — Inpatient Hospital Stay (HOSPITAL_COMMUNITY)
Admission: EM | Admit: 2023-12-14 | Discharge: 2023-12-16 | DRG: 445 | Disposition: A | Discharge status: Home / Self Care | Payer: PRIVATE HEALTH INSURANCE | Attending: Hospitalist | Admitting: Hospitalist

## 2023-12-14 DIAGNOSIS — K8051 Calculus of bile duct without cholangitis or cholecystitis with obstruction: Secondary | ICD-10-CM

## 2023-12-14 DIAGNOSIS — R1013 Epigastric pain: Principal | ICD-10-CM

## 2023-12-14 DIAGNOSIS — K838 Other specified diseases of biliary tract: Secondary | ICD-10-CM

## 2023-12-14 DIAGNOSIS — R7989 Other specified abnormal findings of blood chemistry: Secondary | ICD-10-CM

## 2023-12-14 DIAGNOSIS — Z888 Allergy status to other drugs, medicaments and biological substances status: Secondary | ICD-10-CM

## 2023-12-14 DIAGNOSIS — F411 Generalized anxiety disorder: Secondary | ICD-10-CM | POA: Diagnosis not present

## 2023-12-14 DIAGNOSIS — Z6841 Body Mass Index (BMI) 40.0 and over, adult: Secondary | ICD-10-CM

## 2023-12-14 DIAGNOSIS — Z885 Allergy status to narcotic agent status: Secondary | ICD-10-CM

## 2023-12-14 DIAGNOSIS — J4520 Mild intermittent asthma, uncomplicated: Secondary | ICD-10-CM

## 2023-12-14 DIAGNOSIS — R7401 Elevation of levels of liver transaminase levels: Secondary | ICD-10-CM | POA: Diagnosis present

## 2023-12-14 DIAGNOSIS — K8060 Calculus of gallbladder and bile duct with cholecystitis, unspecified, without obstruction: Secondary | ICD-10-CM | POA: Diagnosis not present

## 2023-12-14 DIAGNOSIS — K8050 Calculus of bile duct without cholangitis or cholecystitis without obstruction: Secondary | ICD-10-CM | POA: Diagnosis not present

## 2023-12-14 DIAGNOSIS — Z87891 Personal history of nicotine dependence: Secondary | ICD-10-CM

## 2023-12-14 DIAGNOSIS — K8020 Calculus of gallbladder without cholecystitis without obstruction: Secondary | ICD-10-CM | POA: Insufficient documentation

## 2023-12-14 DIAGNOSIS — Z79899 Other long term (current) drug therapy: Secondary | ICD-10-CM

## 2023-12-14 DIAGNOSIS — J45909 Unspecified asthma, uncomplicated: Secondary | ICD-10-CM | POA: Diagnosis present

## 2023-12-14 DIAGNOSIS — Z8249 Family history of ischemic heart disease and other diseases of the circulatory system: Secondary | ICD-10-CM

## 2023-12-14 DIAGNOSIS — Z91040 Latex allergy status: Secondary | ICD-10-CM

## 2023-12-14 DIAGNOSIS — Z91048 Other nonmedicinal substance allergy status: Secondary | ICD-10-CM

## 2023-12-14 DIAGNOSIS — Z833 Family history of diabetes mellitus: Secondary | ICD-10-CM

## 2023-12-14 DIAGNOSIS — Z86711 Personal history of pulmonary embolism: Secondary | ICD-10-CM

## 2023-12-14 DIAGNOSIS — F319 Bipolar disorder, unspecified: Secondary | ICD-10-CM | POA: Diagnosis present

## 2023-12-14 DIAGNOSIS — I10 Essential (primary) hypertension: Secondary | ICD-10-CM | POA: Diagnosis present

## 2023-12-14 LAB — CBC WITH DIFFERENTIAL/PLATELET
Abs Immature Granulocytes: 0 K/uL (ref 0.00–0.07)
Basophils Absolute: 0 K/uL (ref 0.0–0.1)
Basophils Relative: 0 %
Eosinophils Absolute: 0.3 K/uL (ref 0.0–0.5)
Eosinophils Relative: 7 %
HCT: 37.8 % (ref 36.0–46.0)
Hemoglobin: 12.2 g/dL (ref 12.0–15.0)
Immature Granulocytes: 0 %
Lymphocytes Relative: 38 %
Lymphs Abs: 1.7 K/uL (ref 0.7–4.0)
MCH: 29.2 pg (ref 26.0–34.0)
MCHC: 32.3 g/dL (ref 30.0–36.0)
MCV: 90.4 fL (ref 80.0–100.0)
Monocytes Absolute: 0.3 K/uL (ref 0.1–1.0)
Monocytes Relative: 7 %
Neutro Abs: 2.2 K/uL (ref 1.7–7.7)
Neutrophils Relative %: 48 %
Platelets: 211 K/uL (ref 150–400)
RBC: 4.18 MIL/uL (ref 3.87–5.11)
RDW: 14.1 % (ref 11.5–15.5)
WBC: 4.5 K/uL (ref 4.0–10.5)
nRBC: 0 % (ref 0.0–0.2)

## 2023-12-14 LAB — COMPREHENSIVE METABOLIC PANEL WITH GFR
ALT: 223 U/L — ABNORMAL HIGH (ref 0–44)
AST: 89 U/L — ABNORMAL HIGH (ref 15–41)
Albumin: 3.4 g/dL — ABNORMAL LOW (ref 3.5–5.0)
Alkaline Phosphatase: 135 U/L — ABNORMAL HIGH (ref 38–126)
Anion gap: 10 (ref 5–15)
BUN: 7 mg/dL (ref 6–20)
CO2: 19 mmol/L — ABNORMAL LOW (ref 22–32)
Calcium: 9.2 mg/dL (ref 8.9–10.3)
Chloride: 109 mmol/L (ref 98–111)
Creatinine, Ser: 0.52 mg/dL (ref 0.44–1.00)
GFR, Estimated: >60 mL/min (ref >60)
Glucose, Bld: 114 mg/dL — ABNORMAL HIGH (ref 70–99)
Potassium: 3.7 mmol/L (ref 3.5–5.1)
Sodium: 138 mmol/L (ref 135–145)
Total Bilirubin: 0.7 mg/dL (ref 0.0–1.2)
Total Protein: 7 g/dL (ref 6.5–8.1)

## 2023-12-14 LAB — URINALYSIS, ROUTINE W REFLEX MICROSCOPIC
Bilirubin Urine: NEGATIVE
Glucose, UA: NEGATIVE mg/dL
Hgb urine dipstick: NEGATIVE
Ketones, ur: NEGATIVE mg/dL
Leukocytes,Ua: NEGATIVE
Nitrite: NEGATIVE
Protein, ur: NEGATIVE mg/dL
Specific Gravity, Urine: 1.016 (ref 1.005–1.030)
pH: 7 (ref 5.0–8.0)

## 2023-12-14 LAB — PREGNANCY, URINE: Preg Test, Ur: NEGATIVE

## 2023-12-14 MED ORDER — KETOROLAC TROMETHAMINE 30 MG/ML IJ SOLN: 30.0000 mg | Freq: Four times a day (QID) | INTRAMUSCULAR | Status: DC | PRN

## 2023-12-14 MED ORDER — DEXTROSE IN LACTATED RINGERS 5 % IV SOLN: INTRAVENOUS | Status: DC

## 2023-12-14 MED ORDER — BUPROPION HCL ER (XL) 150 MG PO TB24: 150.0000 mg | ORAL_TABLET | Freq: Every day | ORAL | Status: DC

## 2023-12-14 MED ORDER — FENTANYL CITRATE (PF) 50 MCG/ML IJ SOSY
12.5000 ug | PREFILLED_SYRINGE | INTRAMUSCULAR | Status: DC | PRN
  Administered 2023-12-14 – 2023-12-16 (×4): 25 ug via INTRAVENOUS
  Filled 2023-12-14 (×4): qty 1

## 2023-12-14 MED ORDER — ACETAMINOPHEN 650 MG RE SUPP: 650.0000 mg | Freq: Four times a day (QID) | RECTAL | Status: DC | PRN

## 2023-12-14 MED ORDER — ACETAMINOPHEN 325 MG PO TABS: 650.0000 mg | ORAL_TABLET | Freq: Four times a day (QID) | ORAL | Status: DC | PRN

## 2023-12-14 MED ORDER — SODIUM CHLORIDE 0.9% FLUSH
3.0000 mL | INTRAVENOUS | Status: DC | PRN
Start: 2023-12-14 — End: 2023-12-16

## 2023-12-14 MED ORDER — SODIUM CHLORIDE 0.9 % IV SOLN: 250.0000 mL | INTRAVENOUS | Status: AC | PRN

## 2023-12-14 MED ORDER — HEPARIN SODIUM (PORCINE) 5000 UNIT/ML IJ SOLN
5000.0000 [IU] | Freq: Three times a day (TID) | INTRAMUSCULAR | Status: DC
  Administered 2023-12-14 – 2023-12-16 (×3): 5000 [IU] via SUBCUTANEOUS
  Filled 2023-12-14 (×4): qty 1

## 2023-12-14 MED ORDER — IBUPROFEN 200 MG PO TABS
200.0000 mg | ORAL_TABLET | Freq: Four times a day (QID) | ORAL | Status: DC | PRN
  Administered 2023-12-15: 200 mg via ORAL
  Filled 2023-12-14: qty 1

## 2023-12-14 MED ORDER — SODIUM CHLORIDE 0.9% FLUSH
3.0000 mL | Freq: Two times a day (BID) | INTRAVENOUS | Status: DC
  Administered 2023-12-15 – 2023-12-16 (×2): 3 mL via INTRAVENOUS

## 2023-12-14 MED ORDER — ONDANSETRON HCL 4 MG PO TABS: 4.0000 mg | ORAL_TABLET | Freq: Four times a day (QID) | ORAL | Status: DC | PRN

## 2023-12-14 MED ORDER — ONDANSETRON HCL 4 MG/2ML IJ SOLN
4.0000 mg | Freq: Four times a day (QID) | INTRAMUSCULAR | Status: DC | PRN
  Administered 2023-12-14 – 2023-12-15 (×2): 4 mg via INTRAVENOUS
  Filled 2023-12-14 (×2): qty 2

## 2023-12-14 MED ORDER — BUSPIRONE HCL 15 MG PO TABS: 7.5000 mg | ORAL_TABLET | Freq: Three times a day (TID) | ORAL | Status: DC

## 2023-12-14 MED ORDER — ALBUTEROL SULFATE (2.5 MG/3ML) 0.083% IN NEBU: 2.5000 mg | INHALATION_SOLUTION | Freq: Four times a day (QID) | RESPIRATORY_TRACT | Status: DC | PRN

## 2023-12-14 NOTE — H&P (Signed)
 History and Physical    Katie Price FMW:[id number] DOB: 04-05-2000 DOA: 12/14/2023  PCP: Leavy Waddell NOVAK, FNP   Patient coming from: Home   Chief Complaint:  Chief Complaint  Patient presents with   Abdominal Pain   Emesis   ED TRIAGE note:    Pt c/o upper abdominal pain and n/v x months.  Sts US  and MRI showing gallstones and sludge.  Pt reports she was sent by PCP for admission and an ERCP.       HPI:  Katie Price is a 23 y.o. female with past medical history of generalized anxiety disorder, reactive airway disease, and morbid obesity presented to the emergency department complaining of nausea, vomiting and associated intermittent abdominal pain for last few months.  Patient has been seen by primary care physician found to have gallstone and she was referred to emergency department for further evaluation. Patient reported intermittent midepigastric abdominal pain with associated nausea and vomiting.  Patient denies any right upper quadrant abdominal pain, fever and chill.  patient does not have any specific complaint at this time.   ED Course:  Presentation to ER patient is hemodynamically stable.  Afebrile. Lab, pregnancy test negative.  CBC unremarkable.  CMP showing low bicarb 19, elevated AST/ALT/ALP and normal bilirubin level.  Per chart review patient MRCP on 11/15 which showed Mild dilation of the extra and central intrahepatic biliary ducts with the common duct measuring 7 mm, with a dilation extending to the level of the ampulla. Small amount of T2 hypointense signal in the distal duct may reflect sludge or tiny stones. Consider further evaluation with ERCP.  Patient when evaluated by GI Dr. Rollin and plan for ERCP tomorrow.  Hospitalist consulted for further evaluation management and management per hospital admission.   Significant labs in the ED: Lab Orders         Comprehensive metabolic panel         CBC with Differential         Urinalysis, Routine  w reflex microscopic -Urine, Clean Catch         Pregnancy, urine         Comprehensive metabolic panel         CBC       Review of Systems:  Review of Systems  Constitutional:  Negative for chills, fever, malaise/fatigue and weight loss.  Respiratory:  Negative for cough, sputum production and shortness of breath.   Cardiovascular:  Negative for chest pain and palpitations.  Gastrointestinal:  Positive for nausea. Negative for constipation, diarrhea, heartburn and vomiting.       Midepigastric abdominal pain  Musculoskeletal:  Negative for myalgias.  Skin:  Negative for rash.  Neurological:  Negative for dizziness and headaches.  Psychiatric/Behavioral:  The patient is not nervous/anxious.     Past Medical History:  Diagnosis Date   Anxiety and depression 09/04/2021   Asthma    Bipolar 1 disorder (HCC) 04/10/2016   Bipolar 1 disorder, depressed, moderate (HCC) 05/14/2014   Bipolar disorder (HCC)    Encounter for gynecological examination with Papanicolaou smear of cervix 07/22/2020   Encounter for initial prescription of contraceptive pills 06/04/2020   Encounter for initial prescription of contraceptives 06/04/2020   Encounter for initial prescription of vaginal ring hormonal contraceptive 11/01/2019   Failed vision screen 04/12/2014   General counseling and advice on contraceptive management 11/10/2020   Hypertension    MDD (major depressive disorder) 09/12/2016   PE (pulmonary thromboembolism) (HCC)    Pregnancy  examination or test, negative result 06/15/2017   Screening examination for STD (sexually transmitted disease) 06/15/2017   Tonsillar and adenoid hypertrophy 07/2016   snores during sleep, denies apnea    Past Surgical History:  Procedure Laterality Date   ETHMOIDECTOMY Right 03/31/2020   Procedure: RIGHT TOTAL ETHMOIDECTOMY AND FRONTAL RECESS exploration;  Surgeon: Karis Clunes, MD;  Location: [facility];  Service: ENT;  Laterality: Right;    MAXILLARY ANTROSTOMY Bilateral 03/31/2020   Procedure: ENDOSCOPIC MAXILLARY ANTROSTOMY WITH TISSUE REMOVAL;  Surgeon: Karis Clunes, MD;  Location: [facility];  Service: ENT;  Laterality: Bilateral;   SINUS ENDO WITH FUSION Bilateral 03/31/2020   Procedure: SINUS ENDOSCOPY WITH FUSION NAVIGATION;  Surgeon: Karis Clunes, MD;  Location: [facility];  Service: ENT;  Laterality: Bilateral;   TONSILLECTOMY AND ADENOIDECTOMY N/A 08/02/2016   Procedure: TONSILLECTOMY AND ADENOIDECTOMY;  Surgeon: Karis Clunes, MD;  Location: [facility];  Service: ENT;  Laterality: N/A;   TURBINATE REDUCTION Bilateral 03/31/2020   Procedure: TURBINATE REDUCTION;  Surgeon: Karis Clunes, MD;  Location: [facility];  Service: ENT;  Laterality: Bilateral;   WISDOM TOOTH EXTRACTION       reports that she has quit smoking. Her smoking use included cigarettes. She has never used smokeless tobacco. She reports current alcohol use. She reports that she does not currently use drugs after having used the following drugs: Marijuana.  Allergies  Allergen Reactions   Adhesive [Tape] Rash   Latex Itching   Morphine Palpitations   Phentermine Palpitations    Family History  Problem Relation Age of Onset   Hypertension Mother    Cancer Mother        cervical   Diabetes Mother    Other Maternal Grandmother        pre diabetic   Other Paternal Grandmother        brain tumor    Prior to Admission medications   Medication Sig Start Date End Date Taking? Authorizing Provider  albuterol  (PROVENTIL ) (2.5 MG/3ML) 0.083% nebulizer solution inhale the contents of 1 vial (3ml) by nebulization 3 (three) times daily. 01/27/22     benzonatate  (TESSALON ) 100 MG capsule Take 1 capsule (100 mg total) by mouth 3 (three) times daily as needed for cough. 11/24/23   Gladis Elsie BROCKS, PA-C  buPROPion  (WELLBUTRIN  XL) 150 MG 24 hr tablet Take 1 tablet (150 mg total) by mouth daily. 02/17/23   Rankin, Shuvon B, NP   busPIRone  (BUSPAR ) 7.5 MG tablet Take 1 tablet (7.5 mg total) by mouth 3 (three) times daily. 02/17/23   Rankin, Shuvon B, NP  dicyclomine  (BENTYL ) 20 MG tablet Take 1 tablet (20 mg total) by mouth 2 (two) times daily. 09/24/23   Nivia Colon, PA-C  diphenhydrAMINE (BENADRYL) 25 mg capsule Take 25 mg by mouth every 6 (six) hours as needed.    [provider]  fexofenadine (ALLEGRA) 180 MG tablet Take 180 mg by mouth daily.    [provider]  fluconazole  (DIFLUCAN ) 150 MG tablet Take 1 tablet PO once. Repeat in 3 days if needed. 11/24/23   Gladis Elsie BROCKS, PA-C  hydrOXYzine  (VISTARIL ) 25 MG capsule Take 1 capsule (25 mg total) by mouth 3 (three) times daily. 02/17/23   Rankin, Shuvon B, NP  medroxyPROGESTERone  (DEPO-PROVERA ) 150 MG/ML injection Inject 1 mL (150 mg total) into the muscle every 3 (three) months. 09/23/23   Signa Delon LABOR, NP  Multiple Vitamin (MULTIVITAMIN) tablet Take 1 tablet by  mouth daily.    [provider]  ondansetron  (ZOFRAN ) 4 MG tablet Take 1 tablet (4 mg total) by mouth every 8 (eight) hours as needed. 12/27/22   Signa Delon LABOR, NP  pantoprazole  (PROTONIX ) 20 MG tablet Take 1 tablet (20 mg total) by mouth daily. 09/24/23   Nivia Colon, PA-C  triamcinolone  cream (KENALOG ) 0.1 % Apply 1 Application topically 2 (two) times daily. 12/07/23   Moishe Chiquita HERO, NP  VENTOLIN  HFA 108 (90 Base) MCG/ACT inhaler Inhale 2 puffs into the lungs every 6 (six) hours as needed. 02/26/21   [provider]  WEGOVY  0.5 MG/0.5ML SOAJ SQ injection Inject 1 mg into the skin. 08/31/23   [provider]     Physical Exam: Vitals:   12/14/23 1449 12/14/23 1455 12/14/23 1931  BP: 115/73  130/89  Pulse: (!) 104  100  Resp: 19  18  Temp: 98 F (36.7 C)  97.8 F (36.6 C)  SpO2: 97%  99%  Weight:  130.2 kg   Height:  5' 8 (1.727 m)     Physical Exam Constitutional:      Appearance: She is well-developed. She is not ill-appearing.   Cardiovascular:     Rate and Rhythm: Normal rate and regular rhythm.     Heart sounds: Normal heart sounds.  Abdominal:     General: Bowel sounds are normal. There is no distension.     Palpations: Abdomen is soft. There is no hepatomegaly.     Tenderness: There is abdominal tenderness in the epigastric area. There is no guarding or rebound. Negative signs include Murphy's sign.  Skin:    Capillary Refill: Capillary refill takes less than 2 seconds.  Neurological:     Mental Status: She is alert and oriented to person, place, and time.  Psychiatric:        Mood and Affect: Mood normal. Mood is not anxious.      Labs on Admission: I have personally reviewed following labs and imaging studies  CBC: Recent Labs  Lab 12/14/23 1501  WBC 4.5  NEUTROABS 2.2  HGB 12.2  HCT 37.8  MCV 90.4  PLT 211   Basic Metabolic Panel: Recent Labs  Lab 12/14/23 1501  NA 138  K 3.7  CL 109  CO2 19*  GLUCOSE 114*  BUN 7  CREATININE 0.52  CALCIUM 9.2   GFR: Estimated Creatinine Clearance: 154.7 mL/min (by C-G formula based on SCr of 0.52 mg/dL). Liver Function Tests: Recent Labs  Lab 12/14/23 1501  AST 89*  ALT 223*  ALKPHOS 135*  BILITOT 0.7  PROT 7.0  ALBUMIN 3.4*   No results for input(s): LIPASE, AMYLASE in the last 168 hours. No results for input(s): AMMONIA in the last 168 hours. Coagulation Profile: No results for input(s): INR, PROTIME in the last 168 hours. Cardiac Enzymes: No results for input(s): CKTOTAL, CKMB, CKMBINDEX, TROPONINI, TROPONINIHS in the last 168 hours. BNP (last 3 results) No results for input(s): BNP in the last 8760 hours. HbA1C: No results for input(s): HGBA1C in the last 72 hours. CBG: No results for input(s): GLUCAP in the last 168 hours. Lipid Profile: No results for input(s): CHOL, HDL, LDLCALC, TRIG, CHOLHDL, LDLDIRECT in the last 72 hours. Thyroid Function Tests: No results for input(s): TSH,  T4TOTAL, FREET4, T3FREE, THYROIDAB in the last 72 hours. Anemia Panel: No results for input(s): VITAMINB12, FOLATE, FERRITIN, TIBC, IRON , RETICCTPCT in the last 72 hours. Urine analysis:    Component Value Date/Time   COLORURINE  YELLOW 12/14/2023 1526   APPEARANCEUR CLEAR 12/14/2023 1526   APPEARANCEUR Clear 04/06/2023 1511   LABSPEC 1.016 12/14/2023 1526   PHURINE 7.0 12/14/2023 1526   GLUCOSEU NEGATIVE 12/14/2023 1526   HGBUR NEGATIVE 12/14/2023 1526   BILIRUBINUR NEGATIVE 12/14/2023 1526   BILIRUBINUR negative 10/29/2023 1008   BILIRUBINUR Negative 04/06/2023 1511   KETONESUR NEGATIVE 12/14/2023 1526   PROTEINUR NEGATIVE 12/14/2023 1526   UROBILINOGEN 1.0 10/29/2023 1008   NITRITE NEGATIVE 12/14/2023 1526   LEUKOCYTESUR NEGATIVE 12/14/2023 1526    Radiological Exams on Admission: I have personally reviewed images No results found.     Assessment/Plan: Principal Problem:   Dilation of biliary tract Active Problems:   Transaminitis   Generalized anxiety disorder   Gallstone   Reactive airway disease   Choledocholithiasis    Assessment and Plan: Choledocholithiasis without acute cholecystitis Transaminitis Cholelithiasis Patient presented emergency department referred from PCP and outpatient workup-MRCP showed dilation of intra and extrahepatic bile duct, common bile duct and gallbladder sludge. - Patient is hemodynamically stable.  Afebrile.  -CMP showed elevated AST 89, ALT 223, ALP 135 and low albumin 3.4.  Normal bilirubin level.  CBC no evidence of leukocytosis. -MRCP from 11/15 showed Mild dilation of the extra and central intrahepatic biliary ducts with the common duct measuring 7 mm, with a dilation extending to the level of the ampulla. Small amount of T2 hypointense signal in the distal duct may reflect sludge or tiny stones. Consider further evaluation with ERCP. - Gastroenterology Dr. Rollin has been consulted plan for ERCP in the  daytime. -At this time there is no concern for acute cholecystitis holding IV antibiotic. -Continue clear liquid diet and keep patient n.p.o. after midnight.  Continue maintenance fluid D5- LR 75 cc/h. -Continue ibuprofen  for mild pain control and fentanyl  12.5 to 25 mg every 2 as needed for moderate to severe pain control.  Reactive airway disease - Continue albuterol  as needed   Generalized anxiety disorder - Continue BuSpar  and Wellbutrin    DVT prophylaxis:  SQ Heparin Code Status:  Full Code Diet: N.p.o. after midnight Family Communication:   Family was present at bedside, at the time of interview. Opportunity was given to ask question and all questions were answered satisfactorily.  Disposition Plan: Continue monitor improvement of hepatic function. Consults: Gastroenterology Admission status:   Inpatient, Med-Surg  Severity of Illness: The appropriate patient status for this patient is INPATIENT. Inpatient status is judged to be reasonable and necessary in order to provide the required intensity of service to ensure the patient's safety. The patient's presenting symptoms, physical exam findings, and initial radiographic and laboratory data in the context of their chronic comorbidities is felt to place them at high risk for further clinical deterioration. Furthermore, it is not anticipated that the patient will be medically stable for discharge from the hospital within 2 midnights of admission.   * I certify that at the point of admission it is my clinical judgment that the patient will require inpatient hospital care spanning beyond 2 midnights from the point of admission due to high intensity of service, high risk for further deterioration and high frequency of surveillance required.DEWAINE    Sheridan Gettel, MD [facility]  How to contact the [facility] Attending or Consulting provider 7A - 7P or covering provider during after hours 7P -7A, for this patient.  Check the care team in  [facility] and look for a) attending/consulting [facility] provider listed and b) the [facility] team listed Log into [email or web address] and use [facility]'s universal password to access. If you do not have the password, please contact the hospital operator. Locate the [facility] provider you are looking for under [facility] and page to a number that you can be directly reached. If you still have difficulty reaching the provider, please page the [facility] (Director on Call) for the Hospitalists listed on amion for assistance.  12/14/2023, 10:14 PM

## 2023-12-14 NOTE — ED Triage Notes (Signed)
 Patient endorses abdominal pain nausea vomiting intermittent in nature. AST/ALT elevation. MRI this weekend showed gallstones and sludge. Pain is dull then sharp in nature. Diarrhea noted by patient when pain is increased.

## 2023-12-14 NOTE — ED Provider Notes (Signed)
 Emergency Department Provider Note   I have reviewed the triage vital signs and the nursing notes.   HISTORY  Chief Complaint Abdominal Pain and Emesis   HPI Katie Price is a 23 y.o. female with past history reviewed below presents emergency department with abdominal pain and nausea.  Symptoms have been intermittent and going on for several months.  She has been doing outpatient workup with her PCP and Dr. Rollin with GI. The plan is for admit and ERCP inpatient tomorrow. She was sent to the ED with plan for admit. No severe symptoms currently.    Past Medical History:  Diagnosis Date   Anxiety and depression 09/04/2021   Asthma    Bipolar 1 disorder (HCC) 04/10/2016   Bipolar 1 disorder, depressed, moderate (HCC) 05/14/2014   Bipolar disorder (HCC)    Encounter for gynecological examination with Papanicolaou smear of cervix 07/22/2020   Encounter for initial prescription of contraceptive pills 06/04/2020   Encounter for initial prescription of contraceptives 06/04/2020   Encounter for initial prescription of vaginal ring hormonal contraceptive 11/01/2019   Failed vision screen 04/12/2014   General counseling and advice on contraceptive management 11/10/2020   Hypertension    MDD (major depressive disorder) 09/12/2016   PE (pulmonary thromboembolism) (HCC)    Pregnancy examination or test, negative result 06/15/2017   Screening examination for STD (sexually transmitted disease) 06/15/2017   Tonsillar and adenoid hypertrophy 07/2016   snores during sleep, denies apnea    Review of Systems  Constitutional: No fever/chills Cardiovascular: Denies chest pain. Respiratory: Denies shortness of breath. Gastrointestinal: Intermittent abdominal pain. Positive nausea and vomiting.  No diarrhea.  No constipation. Genitourinary: Negative for dysuria. Musculoskeletal: Negative for back pain. Skin: Negative for rash. Neurological: Negative for  headaches.  ____________________________________________   PHYSICAL EXAM:  VITAL SIGNS: ED Triage Vitals  Encounter Vitals Group     BP 12/14/23 1449 115/73     Pulse Rate 12/14/23 1449 (!) 104     Resp 12/14/23 1449 19     Temp 12/14/23 1449 98 F (36.7 C)     Temp src --      SpO2 12/14/23 1449 97 %     Weight 12/14/23 1455 287 lb (130.2 kg)     Height 12/14/23 1455 5' 8 (1.727 m)   Constitutional: Alert and oriented. Well appearing and in no acute distress. Eyes: Conjunctivae are normal.  Head: Atraumatic. Nose: No congestion/rhinnorhea. Mouth/Throat: Mucous membranes are moist.  Neck: No stridor.   Cardiovascular: Normal rate, regular rhythm. Good peripheral circulation. Grossly normal heart sounds.   Respiratory: Normal respiratory effort.  Gastrointestinal: No distention.  Musculoskeletal: No gross deformities of extremities. Neurologic:  Normal speech and language.  Skin:  Skin is warm, dry and intact. No rash noted.   ____________________________________________   LABS (all labs ordered are listed, but only abnormal results are displayed)  Labs Reviewed  COMPREHENSIVE METABOLIC PANEL WITH GFR - Abnormal; Notable for the following components:      Result Value   CO2 19 (*)    Glucose, Bld 114 (*)    Albumin 3.4 (*)    AST 89 (*)    ALT 223 (*)    Alkaline Phosphatase 135 (*)    All other components within normal limits  CBC WITH DIFFERENTIAL/PLATELET  URINALYSIS, ROUTINE W REFLEX MICROSCOPIC  PREGNANCY, URINE  CBC  CREATININE, SERUM  COMPREHENSIVE METABOLIC PANEL WITH GFR  CBC   ____________________________________________   PROCEDURES  Procedure(s) performed:   Procedures  None  ____________________________________________   INITIAL IMPRESSION / ASSESSMENT AND PLAN / ED COURSE  Pertinent labs & imaging results that were available during my care of the patient were reviewed by me and considered in my medical decision making (see chart  for details).   This patient is Presenting for Evaluation of abdominal pain, which does require a range of treatment options, and is a complaint that involves a high risk of morbidity and mortality.  The Differential Diagnoses includes but is not exclusive to acute cholecystitis, intrathoracic causes for epigastric abdominal pain, gastritis, duodenitis, pancreatitis, small bowel or large bowel obstruction, abdominal aortic aneurysm, hernia, gastritis, etc.   Critical Interventions-    Medications  buPROPion  (WELLBUTRIN  XL) 24 hr tablet 150 mg (has no administration in time range)  busPIRone  (BUSPAR ) tablet 7.5 mg (has no administration in time range)  dextrose  5 % in lactated ringers  infusion (has no administration in time range)  heparin  injection 5,000 Units (has no administration in time range)  sodium chloride  flush (NS) 0.9 % injection 3 mL (has no administration in time range)  sodium chloride  flush (NS) 0.9 % injection 3 mL (has no administration in time range)  sodium chloride  flush (NS) 0.9 % injection 3 mL (has no administration in time range)  0.9 %  sodium chloride  infusion (has no administration in time range)  ondansetron  (ZOFRAN ) tablet 4 mg (has no administration in time range)    Or  ondansetron  (ZOFRAN ) injection 4 mg (has no administration in time range)  ibuprofen  (ADVIL ) tablet 200 mg (has no administration in time range)  fentaNYL  (SUBLIMAZE ) injection 12.5-25 mcg (has no administration in time range)  albuterol  (VENTOLIN  HFA) 108 (90 Base) MCG/ACT inhaler 1-2 puff (has no administration in time range)    Reassessment after intervention: symptoms resolved.   Clinical Laboratory Tests Ordered, included UA without infection. CBC without anemia. No leukocytosis. CMP with elevated LFTs.   Cardiac Monitor Tracing which shows NSR.    Social Determinants of Health Risk patient is a non-smoker.   Medical Decision Making: Summary:  Patient presents with intermittent  abdominal pain. Plan for admit and ERCP in the AM.   Consulted with [facility]. Plan for admit.   Patient's presentation is most consistent with acute presentation with potential threat to life or bodily function.   Disposition: admit  ____________________________________________  FINAL CLINICAL IMPRESSION(S) / ED DIAGNOSES  Final diagnoses:  Epigastric pain  Elevated LFTs    Note:  This document was prepared using Dragon voice recognition software and may include unintentional dictation errors.  Fonda Law, MD, [facility]    Khaya Theissen, Fonda MATSU, MD 12/14/23 2131

## 2023-12-14 NOTE — ED Triage Notes (Addendum)
 Pt c/o upper abdominal pain and n/v x months.  Sts US  and MRI showing gallstones and sludge.  Pt reports she was sent by PCP for admission and an ERCP.

## 2023-12-14 NOTE — ED Provider Triage Note (Signed)
 Emergency Medicine Provider Triage Evaluation Note  Katie Price , a 23 y.o. female  was evaluated in triage.  Pt complains of right upper quadrant abdominal pain.  Sent from PCP for a ERCP as MRI showed cholelithiasis and sludge.  Review of Systems  Positive: Pain nausea and vomiting times months Negative: Fever, chills, diarrhea, chest pain, shortness of  Physical Exam  BP 115/73 (BP Location: Right Arm)   Pulse (!) 104   Temp 98 F (36.7 C)   Resp 19   Ht 5' 8 (1.727 m)   Wt 130.2 kg   SpO2 97%   BMI 43.64 kg/m  Gen:   Awake, no distress   Resp:  Normal effort  MSK:   Moves extremities without difficulty  Other:    Medical Decision Making  Medically screening exam initiated at 2:57 PM.  Appropriate orders placed.  Katie Price was informed that the remainder of the evaluation will be completed by another provider, this initial triage assessment does not replace that evaluation, and the importance of remaining in the ED until their evaluation is complete.  Labs ordered   Katie Ileana SAILOR, PA-C 12/14/23 1500

## 2023-12-14 NOTE — ED Notes (Signed)
 Per Dr. Rollin, patient to be admitted for ERCP tomorrow. Consulting Civil Engineer notified.

## 2023-12-14 NOTE — ED Notes (Signed)
 Extra red tube drawn

## 2023-12-15 ENCOUNTER — Inpatient Hospital Stay (HOSPITAL_COMMUNITY): Payer: PRIVATE HEALTH INSURANCE

## 2023-12-15 ENCOUNTER — Inpatient Hospital Stay (HOSPITAL_COMMUNITY): Payer: PRIVATE HEALTH INSURANCE | Admitting: Anesthesiology

## 2023-12-15 ENCOUNTER — Encounter (HOSPITAL_COMMUNITY): Admission: EM | Disposition: A | Payer: Self-pay | Source: Home / Self Care | Attending: Hospitalist

## 2023-12-15 DIAGNOSIS — Z6841 Body Mass Index (BMI) 40.0 and over, adult: Secondary | ICD-10-CM | POA: Diagnosis not present

## 2023-12-15 DIAGNOSIS — K8051 Calculus of bile duct without cholangitis or cholecystitis with obstruction: Secondary | ICD-10-CM

## 2023-12-15 DIAGNOSIS — Z91040 Latex allergy status: Secondary | ICD-10-CM | POA: Diagnosis not present

## 2023-12-15 DIAGNOSIS — I10 Essential (primary) hypertension: Secondary | ICD-10-CM | POA: Diagnosis present

## 2023-12-15 DIAGNOSIS — F411 Generalized anxiety disorder: Secondary | ICD-10-CM | POA: Diagnosis present

## 2023-12-15 DIAGNOSIS — R7401 Elevation of levels of liver transaminase levels: Secondary | ICD-10-CM | POA: Diagnosis present

## 2023-12-15 DIAGNOSIS — K838 Other specified diseases of biliary tract: Secondary | ICD-10-CM | POA: Diagnosis not present

## 2023-12-15 DIAGNOSIS — Z888 Allergy status to other drugs, medicaments and biological substances status: Secondary | ICD-10-CM | POA: Diagnosis not present

## 2023-12-15 DIAGNOSIS — Z91048 Other nonmedicinal substance allergy status: Secondary | ICD-10-CM | POA: Diagnosis not present

## 2023-12-15 DIAGNOSIS — Z833 Family history of diabetes mellitus: Secondary | ICD-10-CM | POA: Diagnosis not present

## 2023-12-15 DIAGNOSIS — R1013 Epigastric pain: Secondary | ICD-10-CM | POA: Diagnosis present

## 2023-12-15 DIAGNOSIS — Z79899 Other long term (current) drug therapy: Secondary | ICD-10-CM | POA: Diagnosis not present

## 2023-12-15 DIAGNOSIS — Z87891 Personal history of nicotine dependence: Secondary | ICD-10-CM | POA: Diagnosis not present

## 2023-12-15 DIAGNOSIS — K8060 Calculus of gallbladder and bile duct with cholecystitis, unspecified, without obstruction: Secondary | ICD-10-CM | POA: Diagnosis present

## 2023-12-15 DIAGNOSIS — Z885 Allergy status to narcotic agent status: Secondary | ICD-10-CM | POA: Diagnosis not present

## 2023-12-15 DIAGNOSIS — Z86711 Personal history of pulmonary embolism: Secondary | ICD-10-CM | POA: Diagnosis not present

## 2023-12-15 DIAGNOSIS — Z8249 Family history of ischemic heart disease and other diseases of the circulatory system: Secondary | ICD-10-CM | POA: Diagnosis not present

## 2023-12-15 DIAGNOSIS — J45909 Unspecified asthma, uncomplicated: Secondary | ICD-10-CM

## 2023-12-15 DIAGNOSIS — F319 Bipolar disorder, unspecified: Secondary | ICD-10-CM | POA: Diagnosis present

## 2023-12-15 HISTORY — PX: ERCP: SHX5425

## 2023-12-15 HISTORY — PX: SPHINCTEROTOMY: SHX5279

## 2023-12-15 LAB — CBC
HCT: 37.6 % (ref 36.0–46.0)
Hemoglobin: 12.3 g/dL (ref 12.0–15.0)
MCH: 29.7 pg (ref 26.0–34.0)
MCHC: 32.7 g/dL (ref 30.0–36.0)
MCV: 90.8 fL (ref 80.0–100.0)
Platelets: 210 K/uL (ref 150–400)
RBC: 4.14 MIL/uL (ref 3.87–5.11)
RDW: 14.2 % (ref 11.5–15.5)
WBC: 4.1 K/uL (ref 4.0–10.5)
nRBC: 0 % (ref 0.0–0.2)

## 2023-12-15 LAB — COMPREHENSIVE METABOLIC PANEL WITH GFR
ALT: 206 U/L — ABNORMAL HIGH (ref 0–44)
AST: 87 U/L — ABNORMAL HIGH (ref 15–41)
Albumin: 3.4 g/dL — ABNORMAL LOW (ref 3.5–5.0)
Alkaline Phosphatase: 130 U/L — ABNORMAL HIGH (ref 38–126)
Anion gap: 9 (ref 5–15)
BUN: 6 mg/dL (ref 6–20)
CO2: 22 mmol/L (ref 22–32)
Calcium: 9 mg/dL (ref 8.9–10.3)
Chloride: 106 mmol/L (ref 98–111)
Creatinine, Ser: 0.64 mg/dL (ref 0.44–1.00)
GFR, Estimated: >60 mL/min (ref >60)
Glucose, Bld: 96 mg/dL (ref 70–99)
Potassium: 4.5 mmol/L (ref 3.5–5.1)
Sodium: 137 mmol/L (ref 135–145)
Total Bilirubin: 0.9 mg/dL (ref 0.0–1.2)
Total Protein: 7.3 g/dL (ref 6.5–8.1)

## 2023-12-15 SURGERY — ERCP, WITH INTERVENTION IF INDICATED
Anesthesia: General

## 2023-12-15 MED ORDER — HYDROMORPHONE HCL 1 MG/ML IJ SOLN: 0.2500 mg | INTRAMUSCULAR | Status: DC | PRN

## 2023-12-15 MED ORDER — CIPROFLOXACIN IN D5W 400 MG/200ML IV SOLN
INTRAVENOUS | Status: DC | PRN
  Administered 2023-12-15: 400 mg via INTRAVENOUS

## 2023-12-15 MED ORDER — CIPROFLOXACIN IN D5W 400 MG/200ML IV SOLN
INTRAVENOUS | Status: AC
  Filled 2023-12-15: qty 200

## 2023-12-15 MED ORDER — MEPERIDINE HCL 25 MG/ML IJ SOLN: 6.2500 mg | INTRAMUSCULAR | Status: DC | PRN

## 2023-12-15 MED ORDER — OXYCODONE HCL 5 MG/5ML PO SOLN: 5.0000 mg | Freq: Once | ORAL | Status: DC | PRN

## 2023-12-15 MED ORDER — PROPOFOL 10 MG/ML IV BOLUS
INTRAVENOUS | Status: DC | PRN
  Administered 2023-12-15: 200 mg via INTRAVENOUS

## 2023-12-15 MED ORDER — SODIUM CHLORIDE 0.9 % IV SOLN
INTRAVENOUS | Status: DC | PRN
  Administered 2023-12-15: 20 mL

## 2023-12-15 MED ORDER — FENTANYL CITRATE (PF) 100 MCG/2ML IJ SOLN
INTRAMUSCULAR | Status: AC
  Filled 2023-12-15: qty 2

## 2023-12-15 MED ORDER — DICLOFENAC SUPPOSITORY 100 MG
RECTAL | Status: AC
  Filled 2023-12-15: qty 1

## 2023-12-15 MED ORDER — ROCURONIUM BROMIDE 10 MG/ML (PF) SYRINGE
PREFILLED_SYRINGE | INTRAVENOUS | Status: DC | PRN
  Administered 2023-12-15: 60 mg via INTRAVENOUS

## 2023-12-15 MED ORDER — LIDOCAINE 2% (20 MG/ML) 5 ML SYRINGE
INTRAMUSCULAR | Status: DC | PRN
  Administered 2023-12-15: 8 mg via INTRAVENOUS

## 2023-12-15 MED ORDER — AMISULPRIDE (ANTIEMETIC) 5 MG/2ML IV SOLN
INTRAVENOUS | Status: AC
  Filled 2023-12-15: qty 4

## 2023-12-15 MED ORDER — OXYCODONE HCL 5 MG PO TABS: 5.0000 mg | ORAL_TABLET | Freq: Once | ORAL | Status: DC | PRN

## 2023-12-15 MED ORDER — SUCCINYLCHOLINE CHLORIDE 200 MG/10ML IV SOSY
PREFILLED_SYRINGE | INTRAVENOUS | Status: DC | PRN
  Administered 2023-12-15: 160 mg via INTRAVENOUS

## 2023-12-15 MED ORDER — FENTANYL CITRATE (PF) 250 MCG/5ML IJ SOLN
INTRAMUSCULAR | Status: DC | PRN
  Administered 2023-12-15: 100 ug via INTRAVENOUS

## 2023-12-15 MED ORDER — DICLOFENAC SUPPOSITORY 100 MG
RECTAL | Status: DC | PRN
  Administered 2023-12-15: 100 mg via RECTAL

## 2023-12-15 MED ORDER — GLUCAGON HCL RDNA (DIAGNOSTIC) 1 MG IJ SOLR
INTRAMUSCULAR | Status: AC
  Filled 2023-12-15: qty 1

## 2023-12-15 MED ORDER — DEXAMETHASONE SOD PHOSPHATE PF 10 MG/ML IJ SOLN
INTRAMUSCULAR | Status: DC | PRN
  Administered 2023-12-15: 10 mg via INTRAVENOUS

## 2023-12-15 MED ORDER — SODIUM CHLORIDE 0.9 % IV SOLN: INTRAVENOUS | Status: DC

## 2023-12-15 MED ORDER — GLUCAGON HCL RDNA (DIAGNOSTIC) 1 MG IJ SOLR
INTRAMUSCULAR | Status: DC | PRN
  Administered 2023-12-15: .5 mg via INTRAVENOUS

## 2023-12-15 MED ORDER — SUGAMMADEX SODIUM 200 MG/2ML IV SOLN
INTRAVENOUS | Status: DC | PRN
  Administered 2023-12-15: 200 mg via INTRAVENOUS

## 2023-12-15 MED ORDER — LACTATED RINGERS IV SOLN: INTRAVENOUS | Status: DC | PRN

## 2023-12-15 MED ORDER — AMISULPRIDE (ANTIEMETIC) 5 MG/2ML IV SOLN
10.0000 mg | Freq: Once | INTRAVENOUS | Status: AC | PRN
  Administered 2023-12-15: 10 mg via INTRAVENOUS

## 2023-12-15 MED ORDER — ONDANSETRON HCL 4 MG/2ML IJ SOLN
INTRAMUSCULAR | Status: DC | PRN
  Administered 2023-12-15: 4 mg via INTRAVENOUS

## 2023-12-15 NOTE — Anesthesia Preprocedure Evaluation (Signed)
 Anesthesia Evaluation  Patient identified by MRN, date of birth, ID band Patient awake    Reviewed: Allergy & Precautions, H&P , NPO status , Patient's Chart, lab work & pertinent test results  Airway Mallampati: II  TM Distance: >3 FB Neck ROM: Full    Dental   Pulmonary asthma , former smoker   Pulmonary exam normal breath sounds clear to auscultation       Cardiovascular hypertension, negative cardio ROS Normal cardiovascular exam Rhythm:Regular Rate:Normal     Neuro/Psych   Anxiety Depression Bipolar Disorder   negative neurological ROS  negative psych ROS   GI/Hepatic negative GI ROS, Neg liver ROS,,,  Endo/Other    Class 3 obesity  Renal/GU negative Renal ROS  negative genitourinary   Musculoskeletal negative musculoskeletal ROS (+)    Abdominal  (+) + obese  Peds negative pediatric ROS (+)  Hematology negative hematology ROS (+)   Anesthesia Other Findings   Reproductive/Obstetrics negative OB ROS                              Anesthesia Physical Anesthesia Plan  ASA: 3  Anesthesia Plan: General   Post-op Pain Management:    Induction: Intravenous, Rapid sequence and Cricoid pressure planned  PONV Risk Score and Plan: 3 and Ondansetron , Dexamethasone , Midazolam  and Treatment may vary due to age or medical condition  Airway Management Planned: Oral ETT  Additional Equipment:   Intra-op Plan:   Post-operative Plan: Extubation in OR  Informed Consent: I have reviewed the patients History and Physical, chart, labs and discussed the procedure including the risks, benefits and alternatives for the proposed anesthesia with the patient or authorized representative who has indicated his/her understanding and acceptance.     Dental advisory given  Plan Discussed with: CRNA  Anesthesia Plan Comments:         Anesthesia Quick Evaluation

## 2023-12-15 NOTE — Op Note (Signed)
 [facility] Patient Name: Katie Price Procedure Date : 12/15/2023 MRN: [id number] Attending MD: Belvie Just , MD, [id number] Date of Birth: Jun 11, 2000 CSN: [id number] Age: 23 Admit Type: Inpatient Procedure:                ERCP Indications:              Common bile duct stone(s) Providers:                Belvie Just, MD, Mliss Eagles, RN, Farris Southgate,                            Technician Referring MD:              Medicines:                General Anesthesia Complications:            No immediate complications. Estimated Blood Loss:     Estimated blood loss: none. Procedure:                Pre-Anesthesia Assessment:                           - Prior to the procedure, a History and Physical                            was performed, and patient medications and                            allergies were reviewed. The patient's tolerance of                            previous anesthesia was also reviewed. The risks                            and benefits of the procedure and the sedation                            options and risks were discussed with the patient.                            All questions were answered, and informed consent                            was obtained. Prior Anticoagulants: The patient has                            taken no anticoagulant or antiplatelet agents. ASA                            Grade Assessment: II - A patient with mild systemic                            disease. After reviewing the risks and benefits,  the patient was deemed in satisfactory condition to                            undergo the procedure.                           - Sedation was administered by an anesthesia                            professional. General anesthesia was attained.                           After obtaining informed consent, the scope was                            passed under direct vision. Throughout the                             procedure, the patient's blood pressure, pulse, and                            oxygen saturations were monitored continuously. The                            [id number]-[id number] ([id number] Olympus duodenoscope was                            introduced through the mouth, and used to inject                            contrast into and used to inject contrast into the                            bile, dorsal and ventral pancreatic ducts. The ERCP                            was accomplished without difficulty. The patient                            tolerated the procedure well. Scope In: Scope Out: Findings:      The major papilla was normal. The bile duct was deeply cannulated with       the short-nosed traction sphincterotome. Contrast was injected. I       personally interpreted the bile duct images. There was brisk flow of       contrast through the ducts. Image quality was excellent. Contrast       extended to the hepatic ducts. The common bile duct contained filling       defect(s) thought to be a stone. A short 0.035 inch Soft Jagwire was       passed into the biliary tree. A 10 mm biliary sphincterotomy was made       with a traction (standard) sphincterotome using ERBE electrocautery.       There was no post-sphincterotomy bleeding. The biliary tree was swept       with a 12  mm balloon starting at the bifurcation. Sludge was swept from       the duct. Mucus was swept from the duct.      At the ampulla there was spontaneous drainage of bile. The initial       cannulation was into the PD and then a two wire technique was used. This       facilitated the CBD cannulation and the guidewire was secured in the       right intrahepatic ducts. Contrast injection revealed a mildly dilated       CBD at 7-8 mm. There were irregular, amorphus, filling defects. The       contrast abruptly stopped at the ampulla and no contrast entered the       duodenum. A 1 cm sphincterotomy was created and  there was good flow of       bile. The CBD was swept multiple times with the 12 mm balloon. With each       pass some sludge, but a significant amount of mucus was removed. The       final occlusion cholangiogram was negative for any retained material. Impression:               - The major papilla appeared normal.                           - A filling defect consistent with a stone was seen                            on the cholangiogram.                           - A biliary sphincterotomy was performed.                           - The biliary tree was swept and sludge and mucus                            were found. Recommendation:           - Return patient to hospital ward for ongoing care.                           - Resume regular diet.                           - Monitor liver enzymes.                           - If asymptomatic tomorrow, she can be discharged                            home.                           - Outpatient Surgical consultation. Procedure Code(s):        --- Professional ---                           [id number], Endoscopic retrograde  cholangiopancreatography (ERCP); with removal of                            calculi/debris from biliary/pancreatic duct(s)                           [phone], Endoscopic retrograde                            cholangiopancreatography (ERCP); with                            sphincterotomy/papillotomy                           [phone], Endoscopic catheterization of the biliary                            ductal system, radiological supervision and                            interpretation Diagnosis Code(s):        --- Professional ---                           K80.50, Calculus of bile duct without cholangitis                            or cholecystitis without obstruction                           R93.2, Abnormal findings on diagnostic imaging of                            liver and biliary tract CPT copyright  2022 [employer]. All rights reserved. The codes documented in this report are preliminary and upon coder review may  be revised to meet current compliance requirements. Belvie Just, MD Belvie Just, MD 12/15/2023 2:04:02 PM This report has been signed electronically. Number of Addenda: 0

## 2023-12-15 NOTE — Transfer of Care (Signed)
 Immediate Anesthesia Transfer of Care Note  Patient: Katie Price  Procedure(s) Performed: ERCP, WITH INTERVENTION IF INDICATED SPHINCTEROTOMY  Patient Location: PACU and Endoscopy Unit  Anesthesia Type:General  Level of Consciousness: awake, oriented, and drowsy  Airway & Oxygen Therapy: Patient Spontanous Breathing  Post-op Assessment: Report given to RN and Post -op Vital signs reviewed and stable  Post vital signs: Reviewed and stable  Last Vitals:  Vitals Value Taken Time  BP    Temp    Pulse 113 12/15/23 14:01  Resp 20 12/15/23 14:01  SpO2 96 % 12/15/23 14:01  Vitals shown include unfiled device data.  Last Pain:  Vitals:   12/15/23 1224  TempSrc: Temporal  PainSc: 5          Complications: No notable events documented.

## 2023-12-15 NOTE — Anesthesia Procedure Notes (Signed)
 Procedure Name: Intubation Date/Time: 12/15/2023 1:08 PM  Performed by: Genny Gun, CRNAPre-anesthesia Checklist: Patient identified, Emergency Drugs available, Suction available, Patient being monitored and Timeout performed Patient Re-evaluated:Patient Re-evaluated prior to induction Oxygen Delivery Method: Circle system utilized Preoxygenation: Pre-oxygenation with 100% oxygen Induction Type: IV induction and Rapid sequence Laryngoscope Size: Mac and 4 Grade View: Grade I Tube type: Oral Tube size: 7.0 mm Number of attempts: 1 Placement Confirmation: ETT inserted through vocal cords under direct vision, positive ETCO2 and breath sounds checked- equal and bilateral Secured at: 21 cm Tube secured with: Tape Dental Injury: Teeth and Oropharynx as per pre-operative assessment

## 2023-12-15 NOTE — ED Notes (Signed)
 Floor notified of patient coming up but doc at bedside and transport on the way to take her

## 2023-12-15 NOTE — Consult Note (Signed)
 Reason for Consult: Choledocholithiasis Referring Physician: [facility]  Katie Price HPI: This is a 23 year old female with biliary colic.  She was evaluated as an outpatient by Dr. Kristie and the work up showed an obstructive pattern with her liver enzymes.  The MRCP was positive for sludge or tiny stones in the distal CBD.  The CBD measured 7 mm and there was intra and extrahepatic biliary ductal dilation.  Because of her persistent symptoms with PO intake, she was advised to present to the hospital for admission.  Past Medical History:  Diagnosis Date   Anxiety and depression 09/04/2021   Asthma    Bipolar 1 disorder (HCC) 04/10/2016   Bipolar 1 disorder, depressed, moderate (HCC) 05/14/2014   Bipolar disorder (HCC)    Encounter for gynecological examination with Papanicolaou smear of cervix 07/22/2020   Encounter for initial prescription of contraceptive pills 06/04/2020   Encounter for initial prescription of contraceptives 06/04/2020   Encounter for initial prescription of vaginal ring hormonal contraceptive 11/01/2019   Failed vision screen 04/12/2014   General counseling and advice on contraceptive management 11/10/2020   Hypertension    MDD (major depressive disorder) 09/12/2016   PE (pulmonary thromboembolism) (HCC)    Pregnancy examination or test, negative result 06/15/2017   Screening examination for STD (sexually transmitted disease) 06/15/2017   Tonsillar and adenoid hypertrophy 07/2016   snores during sleep, denies apnea    Past Surgical History:  Procedure Laterality Date   ETHMOIDECTOMY Right 03/31/2020   Procedure: RIGHT TOTAL ETHMOIDECTOMY AND FRONTAL RECESS exploration;  Surgeon: Karis Clunes, MD;  Location: [facility];  Service: ENT;  Laterality: Right;   MAXILLARY ANTROSTOMY Bilateral 03/31/2020   Procedure: ENDOSCOPIC MAXILLARY ANTROSTOMY WITH TISSUE REMOVAL;  Surgeon: Karis Clunes, MD;  Location: [facility];  Service: ENT;   Laterality: Bilateral;   SINUS ENDO WITH FUSION Bilateral 03/31/2020   Procedure: SINUS ENDOSCOPY WITH FUSION NAVIGATION;  Surgeon: Karis Clunes, MD;  Location: [facility];  Service: ENT;  Laterality: Bilateral;   TONSILLECTOMY AND ADENOIDECTOMY N/A 08/02/2016   Procedure: TONSILLECTOMY AND ADENOIDECTOMY;  Surgeon: Karis Clunes, MD;  Location: [facility];  Service: ENT;  Laterality: N/A;   TURBINATE REDUCTION Bilateral 03/31/2020   Procedure: TURBINATE REDUCTION;  Surgeon: Karis Clunes, MD;  Location: [facility];  Service: ENT;  Laterality: Bilateral;   WISDOM TOOTH EXTRACTION      Family History  Problem Relation Age of Onset   Hypertension Mother    Cancer Mother        cervical   Diabetes Mother    Other Maternal Grandmother        pre diabetic   Other Paternal Grandmother        brain tumor    Social History:  reports that she has quit smoking. Her smoking use included cigarettes. She has never used smokeless tobacco. She reports current alcohol use. She reports that she does not currently use drugs after having used the following drugs: Marijuana.  Allergies:  Allergies  Allergen Reactions   Adhesive [Tape] Rash   Latex Itching   Morphine Palpitations   Phentermine Palpitations    Medications: Scheduled:  heparin  5,000 Units Subcutaneous Q8H   sodium chloride  flush  3 mL Intravenous Q12H   sodium chloride  flush  3 mL Intravenous Q12H   Continuous:  sodium chloride      sodium chloride      dextrose  5% lactated ringers  Stopped (12/15/23 0039)  Results for orders placed or performed during the hospital encounter of 12/14/23 (from the past 24 hours)  Comprehensive metabolic panel     Status: Abnormal   Collection Time: 12/14/23  3:01 PM  Result Value Ref Range   Sodium 138 135 - 145 mmol/L   Potassium 3.7 3.5 - 5.1 mmol/L   Chloride 109 98 - 111 mmol/L   CO2 19 (L) 22 - 32 mmol/L   Glucose, Bld 114 (H) 70 - 99 mg/dL   BUN 7 6 - 20  mg/dL   Creatinine, Ser 9.47 0.44 - 1.00 mg/dL   Calcium 9.2 8.9 - 89.6 mg/dL   Total Protein 7.0 6.5 - 8.1 g/dL   Albumin 3.4 (L) 3.5 - 5.0 g/dL   AST 89 (H) 15 - 41 U/L   ALT 223 (H) 0 - 44 U/L   Alkaline Phosphatase 135 (H) 38 - 126 U/L   Total Bilirubin 0.7 0.0 - 1.2 mg/dL   GFR, Estimated >39 >39 mL/min   Anion gap 10 5 - 15  CBC with Differential     Status: None   Collection Time: 12/14/23  3:01 PM  Result Value Ref Range   WBC 4.5 4.0 - 10.5 K/uL   RBC 4.18 3.87 - 5.11 MIL/uL   Hemoglobin 12.2 12.0 - 15.0 g/dL   HCT 62.1 63.9 - 53.9 %   MCV 90.4 80.0 - 100.0 fL   MCH 29.2 26.0 - 34.0 pg   MCHC 32.3 30.0 - 36.0 g/dL   RDW 85.8 88.4 - 84.4 %   Platelets 211 150 - 400 K/uL   nRBC 0.0 0.0 - 0.2 %   Neutrophils Relative % 48 %   Neutro Abs 2.2 1.7 - 7.7 K/uL   Lymphocytes Relative 38 %   Lymphs Abs 1.7 0.7 - 4.0 K/uL   Monocytes Relative 7 %   Monocytes Absolute 0.3 0.1 - 1.0 K/uL   Eosinophils Relative 7 %   Eosinophils Absolute 0.3 0.0 - 0.5 K/uL   Basophils Relative 0 %   Basophils Absolute 0.0 0.0 - 0.1 K/uL   Immature Granulocytes 0 %   Abs Immature Granulocytes 0.00 0.00 - 0.07 K/uL  Urinalysis, Routine w reflex microscopic -Urine, Clean Catch     Status: None   Collection Time: 12/14/23  3:26 PM  Result Value Ref Range   Color, Urine YELLOW YELLOW   APPearance CLEAR CLEAR   Specific Gravity, Urine 1.016 1.005 - 1.030   pH 7.0 5.0 - 8.0   Glucose, UA NEGATIVE NEGATIVE mg/dL   Hgb urine dipstick NEGATIVE NEGATIVE   Bilirubin Urine NEGATIVE NEGATIVE   Ketones, ur NEGATIVE NEGATIVE mg/dL   Protein, ur NEGATIVE NEGATIVE mg/dL   Nitrite NEGATIVE NEGATIVE   Leukocytes,Ua NEGATIVE NEGATIVE  Pregnancy, urine     Status: None   Collection Time: 12/14/23  3:27 PM  Result Value Ref Range   Preg Test, Ur NEGATIVE NEGATIVE  Comprehensive metabolic panel     Status: Abnormal   Collection Time: 12/15/23 12:28 AM  Result Value Ref Range   Sodium 137 135 - 145 mmol/L    Potassium 4.5 3.5 - 5.1 mmol/L   Chloride 106 98 - 111 mmol/L   CO2 22 22 - 32 mmol/L   Glucose, Bld 96 70 - 99 mg/dL   BUN 6 6 - 20 mg/dL   Creatinine, Ser 9.35 0.44 - 1.00 mg/dL   Calcium 9.0 8.9 - 89.6 mg/dL   Total Protein 7.3 6.5 - 8.1 g/dL  Albumin 3.4 (L) 3.5 - 5.0 g/dL   AST 87 (H) 15 - 41 U/L   ALT 206 (H) 0 - 44 U/L   Alkaline Phosphatase 130 (H) 38 - 126 U/L   Total Bilirubin 0.9 0.0 - 1.2 mg/dL   GFR, Estimated >39 >39 mL/min   Anion gap 9 5 - 15  CBC     Status: None   Collection Time: 12/15/23 12:28 AM  Result Value Ref Range   WBC 4.1 4.0 - 10.5 K/uL   RBC 4.14 3.87 - 5.11 MIL/uL   Hemoglobin 12.3 12.0 - 15.0 g/dL   HCT 62.3 63.9 - 53.9 %   MCV 90.8 80.0 - 100.0 fL   MCH 29.7 26.0 - 34.0 pg   MCHC 32.7 30.0 - 36.0 g/dL   RDW 85.7 88.4 - 84.4 %   Platelets 210 150 - 400 K/uL   nRBC 0.0 0.0 - 0.2 %     No results found.  ROS:  As stated above in the HPI otherwise negative.  Blood pressure 135/88, pulse 98, temperature (!) 97.2 F (36.2 C), temperature source Temporal, resp. rate 10, height 5' 8 (1.727 m), weight 130.2 kg, SpO2 100%.    PE: Gen: NAD, Alert and Oriented HEENT:  [place]/AT, EOMI Neck: Supple, no LAD Lungs: CTA Bilaterally CV: RRR without M/G/R ABD: Soft, NTND, +BS Ext: No C/C/E  Assessment/Plan: 1) Choledocholithiasis. 2) Biliary colic. 3) Abnormal liver enzymes.   The patient's images and blood work were reviewed.  An ERCP with stone extraction will be performed.  The possibility of pancreatitis was discussed with the patient and she wants to proceed with the ERCP.  Izaia Say D 12/15/2023, 12:36 PM

## 2023-12-15 NOTE — TOC CM/SW Note (Signed)
 Transition of Care [facility]) - Inpatient Brief Assessment   Patient Details  Name: Katie Price MRN: [id number] Date of Birth: January 16, 2001  [facility] CM/SW Contact:    Lauraine FORBES Saa, LCSWA Phone Number: 12/15/2023, 12:14 PM   Clinical Narrative:  12:14 PM Per chart review, resides at home. Patient has a PCP and insurance. Patient does not have SNF/HH/DME history. Patient's preferred pharmacy's are [facility] [id number] and [facility]. No TOC needs identified at this time. TOC will continue to follow.  Transition of Care Asessment: Insurance and Status: Insurance coverage has been reviewed Patient has primary care physician: Yes Home environment has been reviewed: Private Residence Prior level of function:: N/A Prior/Current Home Services: No current home services Social Drivers of Health Review: SDOH reviewed no interventions necessary Readmission risk has been reviewed: Yes (Currently Green 7%) Transition of care needs: no transition of care needs at this time

## 2023-12-15 NOTE — Progress Notes (Signed)
 PROGRESS NOTE    Katie Price  FMW:[id number] DOB: 10-May-2000 DOA: 12/14/2023 PCP: Leavy Waddell NOVAK, FNP   Brief Narrative:   Katie Price is a 23 y.o. female with past medical history of generalized anxiety disorder, reactive airway disease, and morbid obesity presented to the emergency department complaining of nausea, vomiting and associated intermittent abdominal pain for last few months.  Patient has been seen by primary care physician found to have gallstone and she was referred to emergency department for further evaluation. Found to have elevated transaminases, normal bilirubin, MRCP with dilated biliary duct and concern for choledocholithiasis.  Patient admitted for ERCP.  Assessment & Plan:   Principal Problem:   Dilation of biliary tract Active Problems:   Transaminitis   Generalized anxiety disorder   Gallstone   Reactive airway disease   Choledocholithiasis   Assessment and Plan: Choledocholithiasis without acute cholecystitis Transaminitis Cholelithiasis Patient presented emergency department referred from PCP and outpatient workup-MRCP showed dilation of intra and extrahepatic bile duct, common bile duct and gallbladder sludge. - Patient is hemodynamically stable.  Afebrile.  -CMP showed elevated AST 89, ALT 223, ALP 135 and low albumin 3.4.  Normal bilirubin level.  CBC no evidence of leukocytosis. -MRCP from 11/15 showed Mild dilation of the extra and central intrahepatic biliary ducts with the common duct measuring 7 mm, with a dilation extending to the level of the ampulla. Small amount of T2 hypointense signal in the distal duct may reflect sludge or tiny stones. GI on board, plan for ERCP today. Continue IV fluids IV analgesics, antiemetics as needed  Reactive airway disease - Continue albuterol  as needed     Generalized anxiety disorder - Continue BuSpar  and Wellbutrin      DVT prophylaxis:  SQ Heparin Code Status:  Full Code Diet: Currently  n.p.o., diet recommendation per GI after procedure.   Family Communication:   None.   Disposition Plan: Continue monitor improvement of hepatic function. Consults: Gastroenterology Admission status:   Inpatient, Med-Surg   Severity of Illness: The appropriate patient status for this patient is INPATIENT. Inpatient status is judged to be reasonable and necessary in order to provide the required intensity of service to ensure the patient's safety. The patient's presenting symptoms, physical exam findings, and initial radiographic and laboratory data in the context of their chronic comorbidities is felt to place them at high risk for further clinical deterioration. Furthermore, it is not anticipated that the patient will be medically stable for discharge from the hospital within 2 midnights of admission.    * I certify that at the point of admission it is my clinical judgment that the patient will require inpatient hospital care spanning beyond 2 midnights from the point of admission due to high intensity of service, high risk for further deterioration and high frequency of surveillance required.*     Subjective:  Seen and examined at the bedside.  She reports of epigastric pain and is not in any distress.  Has been refusing pain medications.  Vital signs are stable.  Plan for ERCP today.  Objective: Vitals:   12/15/23 0845 12/15/23 0847 12/15/23 1102 12/15/23 1224  BP: 112/77  (!) 147/95 135/88  Pulse: 94  (!) 105 98  Resp: 16  16 10   Temp:  98.8 F (37.1 C) 98.4 F (36.9 C) (!) 97.2 F (36.2 C)  TempSrc:  Oral Oral Temporal  SpO2: 100%  100% 100%  Weight:      Height:       No  intake or output data in the 24 hours ending 12/15/23 1319 Filed Weights   12/14/23 1455  Weight: 130.2 kg    Examination:  General exam: Appears calm and comfortable  Respiratory system: Bilateral decreased breath sounds at bases Cardiovascular system: S1 & S2 heard, Rate controlled Gastrointestinal  system: Abdomen is nondistended, soft and nontender. Normal bowel sounds heard. Extremities: No cyanosis, clubbing, edema  Central nervous system: Alert and oriented. No focal neurological deficits. Moving extremities Skin: No rashes, lesions or ulcers Psychiatry: Judgement and insight appear normal. Mood & affect appropriate.     Data Reviewed: I have personally reviewed following labs and imaging studies  CBC: Recent Labs  Lab 12/14/23 1501 12/15/23 0028  WBC 4.5 4.1  NEUTROABS 2.2  --   HGB 12.2 12.3  HCT 37.8 37.6  MCV 90.4 90.8  PLT 211 210   Basic Metabolic Panel: Recent Labs  Lab 12/14/23 1501 12/15/23 0028  NA 138 137  K 3.7 4.5  CL 109 106  CO2 19* 22  GLUCOSE 114* 96  BUN 7 6  CREATININE 0.52 0.64  CALCIUM 9.2 9.0   GFR: Estimated Creatinine Clearance: 154.7 mL/min (by C-G formula based on SCr of 0.64 mg/dL). Liver Function Tests: Recent Labs  Lab 12/14/23 1501 12/15/23 0028  AST 89* 87*  ALT 223* 206*  ALKPHOS 135* 130*  BILITOT 0.7 0.9  PROT 7.0 7.3  ALBUMIN 3.4* 3.4*   No results for input(s): LIPASE, AMYLASE in the last 168 hours. No results for input(s): AMMONIA in the last 168 hours. Coagulation Profile: No results for input(s): INR, PROTIME in the last 168 hours. Cardiac Enzymes: No results for input(s): CKTOTAL, CKMB, CKMBINDEX, TROPONINI in the last 168 hours. BNP (last 3 results) No results for input(s): PROBNP in the last 8760 hours. HbA1C: No results for input(s): HGBA1C in the last 72 hours. CBG: No results for input(s): GLUCAP in the last 168 hours. Lipid Profile: No results for input(s): CHOL, HDL, LDLCALC, TRIG, CHOLHDL, LDLDIRECT in the last 72 hours. Thyroid Function Tests: No results for input(s): TSH, T4TOTAL, FREET4, T3FREE, THYROIDAB in the last 72 hours. Anemia Panel: No results for input(s): VITAMINB12, FOLATE, FERRITIN, TIBC, IRON , RETICCTPCT in the last 72  hours. Sepsis Labs: No results for input(s): PROCALCITON, LATICACIDVEN in the last 168 hours.  No results found for this or any previous visit (from the past 240 hours).       Radiology Studies: No results found.      Scheduled Meds:  [MAR Hold] heparin  5,000 Units Subcutaneous Q8H   [MAR Hold] sodium chloride  flush  3 mL Intravenous Q12H   [MAR Hold] sodium chloride  flush  3 mL Intravenous Q12H   Continuous Infusions:  sodium chloride      [MAR Hold] sodium chloride      dextrose  5% lactated ringers  Stopped (12/15/23 0039)          Derryl Duval, MD [facility] 12/15/2023, 1:19 PM

## 2023-12-15 NOTE — Plan of Care (Signed)

## 2023-12-15 NOTE — ED Notes (Signed)
 Offered patient PRN med for pain score of 7/10, patient refused.

## 2023-12-16 ENCOUNTER — Encounter (HOSPITAL_COMMUNITY): Payer: Self-pay | Admitting: Gastroenterology

## 2023-12-16 LAB — COMPREHENSIVE METABOLIC PANEL WITH GFR
ALT: 223 U/L — ABNORMAL HIGH (ref 0–44)
AST: 96 U/L — ABNORMAL HIGH (ref 15–41)
Albumin: 3.4 g/dL — ABNORMAL LOW (ref 3.5–5.0)
Alkaline Phosphatase: 149 U/L — ABNORMAL HIGH (ref 38–126)
Anion gap: 13 (ref 5–15)
BUN: 8 mg/dL (ref 6–20)
CO2: 20 mmol/L — ABNORMAL LOW (ref 22–32)
Calcium: 9.2 mg/dL (ref 8.9–10.3)
Chloride: 104 mmol/L (ref 98–111)
Creatinine, Ser: 0.72 mg/dL (ref 0.44–1.00)
GFR, Estimated: >60 mL/min (ref >60)
Glucose, Bld: 98 mg/dL (ref 70–99)
Potassium: 3.5 mmol/L (ref 3.5–5.1)
Sodium: 137 mmol/L (ref 135–145)
Total Bilirubin: 0.6 mg/dL (ref 0.0–1.2)
Total Protein: 7.3 g/dL (ref 6.5–8.1)

## 2023-12-16 MED ORDER — FENTANYL CITRATE (PF) 50 MCG/ML IJ SOSY
6.2500 ug | PREFILLED_SYRINGE | Freq: Once | INTRAMUSCULAR | Status: AC
  Administered 2023-12-16: 6.5 ug via INTRAVENOUS
  Filled 2023-12-16: qty 1

## 2023-12-16 NOTE — Anesthesia Postprocedure Evaluation (Signed)
 Anesthesia Post Note  Patient: Katie Price  Procedure(s) Performed: ERCP, WITH INTERVENTION IF INDICATED SPHINCTEROTOMY     Patient location during evaluation: PACU Anesthesia Type: General Level of consciousness: awake and alert Pain management: pain level controlled Vital Signs Assessment: post-procedure vital signs reviewed and stable Respiratory status: spontaneous breathing, nonlabored ventilation and respiratory function stable Cardiovascular status: blood pressure returned to baseline and stable Postop Assessment: no apparent nausea or vomiting Anesthetic complications: no   No notable events documented.                Vijay Durflinger

## 2023-12-16 NOTE — Plan of Care (Signed)

## 2023-12-16 NOTE — Discharge Summary (Signed)
 Physician Discharge Summary  Katie Price FMW:[id number] DOB: [birth year] DOA: 12/14/2023  PCP: Leavy Waddell NOVAK, FNP  Admit date: 12/14/2023 Discharge date: 12/16/2023  Admitted From: Home Disposition: Home  Recommendations for Outpatient Follow-up:  Follow up with GI in 1-2 week Need to see general surgery to discuss cholecystectomy, referral placed   Home Health: No Equipment/Devices: None  Discharge Condition: Stable CODE STATUS: Full Diet recommendation: Heart healthy  Brief/Interim Summary: 23 year old female with past medical history of generalized anxiety disorder, reactive airway disease, obesity presented to the ER with complaints of nausea, vomiting with intermittent upper abdominal pain for the last few months.  She was seen by primary care provider and also GI regarding gallstones. She did have outpt MRCP showing biliary dilatation and concern for choledocholithiasis   She was sent to the ER for admission and ERCP.  She did have mild symptoms of upper abdominal pain.  CMP showed elevated AST/ALT/alkaline phosphatase with normal bilirubin level.  No evidence of fever or sepsis.  Patient was seen by GI and underwent ERCP on 11/20.  Sludge and mucus removed.  Patient then discharged within 24 hours of procedure.  She still has elevated liver enzymes stable AST 93, ALT 223.  Need to follow-up with PCP/GI.  She will need to see general surgery for consideration of cholecystectomy.  Referral placed.     Discharge Diagnoses:  Principal Problem:   Dilation of biliary tract Active Problems:   Transaminitis   Generalized anxiety disorder   Gallstone   Reactive airway disease   Choledocholithiasis    Discharge Instructions  Discharge Instructions     Ambulatory referral to General Surgery   Complete by: As directed    Choledocholithiasis.  Evaluate for cholecystectomy   Discharge instructions   Complete by: As directed    1.  Follow-up with  gastroenterology 2.  Will place referral to general surgery for evaluation for cholecystectomy. 3.  Please follow low fat diet   Increase activity slowly   Complete by: As directed       Allergies as of 12/16/2023       Reactions   Adhesive [tape] Rash   Latex Itching   Morphine Palpitations   Phentermine Palpitations        Medication List     STOP taking these medications    buPROPion  150 MG 24 hr tablet Commonly known as: Wellbutrin  XL   busPIRone  7.5 MG tablet Commonly known as: BUSPAR    pantoprazole  20 MG tablet Commonly known as: PROTONIX        TAKE these medications    diphenhydrAMINE 25 mg capsule Commonly known as: BENADRYL Take 25 mg by mouth at bedtime.   fexofenadine 180 MG tablet Commonly known as: ALLEGRA Take 180 mg by mouth daily.   lisdexamfetamine 30 MG capsule Commonly known as: VYVANSE Take 30 mg by mouth daily.   medroxyPROGESTERone  150 MG/ML injection Commonly known as: DEPO-PROVERA  Inject 1 mL (150 mg total) into the muscle every 3 (three) months.   multivitamin tablet Take 1 tablet by mouth daily.   ondansetron  4 MG tablet Commonly known as: Zofran  Take 1 tablet (4 mg total) by mouth every 8 (eight) hours as needed.   triamcinolone  cream 0.1 % Commonly known as: KENALOG  Apply 1 Application topically 2 (two) times daily.   Ventolin  HFA 108 (90 Base) MCG/ACT inhaler Generic drug: albuterol  Inhale 2 puffs into the lungs every 6 (six) hours as needed.   albuterol  (2.5 MG/3ML) 0.083% nebulizer solution Commonly known as:  PROVENTIL  inhale the contents of 1 vial (3ml) by nebulization 3 (three) times daily.   Wegovy  0.5 MG/0.5ML Soaj SQ injection Generic drug: semaglutide -weight management Inject 1 mg into the skin every Tuesday.        Follow-up Information     Rollin Dover, MD. Call in 1 week(s).   Specialty: Gastroenterology Contact information: [address] [place]  [address] [phone]         Leavy Waddell NOVAK, FNP. Schedule an appointment as soon as possible for a visit in 1 week(s).   Specialty: Family Medicine Contact information: [address] [place] [address] [phone]         Surgery, [place]. Call in 1 week(s).   Specialty: General Surgery Contact information: [address] [place] [address] [phone]                Allergies  Allergen Reactions   Adhesive [Tape] Rash   Latex Itching   Morphine Palpitations   Phentermine Palpitations    Consultations:    Procedures/Studies: DG ERCP Result Date: 12/15/2023 CLINICAL DATA:  Common bile duct stones EXAM: ERCP 8 fluoroscopic images of the right upper quadrant TECHNIQUE: Multiple spot images obtained with the fluoroscopic device and submitted for interpretation post-procedure. FLUOROSCOPY: Radiation Exposure Index (as provided by the fluoroscopic device): 43.868 mGy Kerma COMPARISON:  None Available. FINDINGS: Intra procedural images demonstrate a flexible endoscopy device with 2 guidewires extending into the common bile duct and the pancreatic duct. Contrast injected demonstrates filling defects within the common bile duct with minimal proximal ductal dilatation. Contrast can also be seen flowing into the intrahepatic ducts and cystic duct filling the gallbladder. No contrast extravasation. The guidewire was removed from the pancreatic duct and balloon sweep of the common bile duct was performed. Final image demonstrates standing column of contrast within the common bile duct and no filling defects suggesting stone removal. IMPRESSION: ERCP with balloon sweep of common bile duct. These images were submitted for radiologic interpretation only. Please see the procedural report for the amount of contrast and the fluoroscopy time utilized. Electronically Signed   By: Cordella Banner   On: 12/15/2023 15:59   MR ABDOMEN MRCP W WO CONTAST Result Date:  12/10/2023 CLINICAL DATA:  Concern for biliary obstruction. EXAM: MRI ABDOMEN WITHOUT AND WITH CONTRAST (INCLUDING MRCP) TECHNIQUE: Multiplanar multisequence MR imaging of the abdomen was performed both before and after the administration of intravenous contrast. Heavily T2-weighted images of the biliary and pancreatic ducts were obtained, and three-dimensional MRCP images were rendered by post processing. CONTRAST:  10mL GADAVIST  GADOBUTROL  1 MMOL/ML IV SOLN COMPARISON:  Ultrasound October 08, 2023 and CT September 24, 2023. FINDINGS: Lower chest: No acute abnormality. Hepatobiliary: No significant hepatic steatosis or iron  deposition. No suspicious hepatic lesion. Gallbladder is unremarkable. Mild dilation of the extra and central intrahepatic biliary ducts with the common duct measuring 7 mm, with a dilation extending to the level of the ampulla. Small amount of T2 hypointense signal in the distal duct on image 53/17 may reflect sludge or tiny stones. Pancreas: No pancreatic ductal dilation or evidence of acute inflammation. Spleen:  No splenomegaly or focal splenic lesion. Adrenals/Urinary Tract: No suspicious adrenal nodule/mass. No hydronephrosis. Kidneys demonstrate symmetric enhancement. Stomach/Bowel: Visualized portions within the abdomen are unremarkable. Vascular/Lymphatic: No pathologically enlarged lymph nodes identified. No abdominal aortic aneurysm demonstrated. Other:  None. Musculoskeletal: No suspicious bone lesions identified. IMPRESSION: Mild dilation of the extra and central intrahepatic biliary ducts with  the common duct measuring 7 mm, with a dilation extending to the level of the ampulla. Small amount of T2 hypointense signal in the distal duct may reflect sludge or tiny stones. Consider further evaluation with ERCP. Electronically Signed   By: Reyes Holder M.D.   On: 12/10/2023 12:03   MR 3D Recon At Scanner Result Date: 12/10/2023 CLINICAL DATA:  Concern for biliary obstruction.  EXAM: MRI ABDOMEN WITHOUT AND WITH CONTRAST (INCLUDING MRCP) TECHNIQUE: Multiplanar multisequence MR imaging of the abdomen was performed both before and after the administration of intravenous contrast. Heavily T2-weighted images of the biliary and pancreatic ducts were obtained, and three-dimensional MRCP images were rendered by post processing. CONTRAST:  10mL GADAVIST  GADOBUTROL  1 MMOL/ML IV SOLN COMPARISON:  Ultrasound October 08, 2023 and CT September 24, 2023. FINDINGS: Lower chest: No acute abnormality. Hepatobiliary: No significant hepatic steatosis or iron  deposition. No suspicious hepatic lesion. Gallbladder is unremarkable. Mild dilation of the extra and central intrahepatic biliary ducts with the common duct measuring 7 mm, with a dilation extending to the level of the ampulla. Small amount of T2 hypointense signal in the distal duct on image 53/17 may reflect sludge or tiny stones. Pancreas: No pancreatic ductal dilation or evidence of acute inflammation. Spleen:  No splenomegaly or focal splenic lesion. Adrenals/Urinary Tract: No suspicious adrenal nodule/mass. No hydronephrosis. Kidneys demonstrate symmetric enhancement. Stomach/Bowel: Visualized portions within the abdomen are unremarkable. Vascular/Lymphatic: No pathologically enlarged lymph nodes identified. No abdominal aortic aneurysm demonstrated. Other:  None. Musculoskeletal: No suspicious bone lesions identified. IMPRESSION: Mild dilation of the extra and central intrahepatic biliary ducts with the common duct measuring 7 mm, with a dilation extending to the level of the ampulla. Small amount of T2 hypointense signal in the distal duct may reflect sludge or tiny stones. Consider further evaluation with ERCP. Electronically Signed   By: Reyes Holder M.D.   On: 12/10/2023 12:03   DG Ankle Left Port Result Date: 11/20/2023 CLINICAL DATA:  Fall with left ankle pain and swelling. EXAM: PORTABLE LEFT ANKLE - 2 VIEW; LEFT FOOT - COMPLETE 3+  VIEW COMPARISON:  None Available. FINDINGS: Left foot: Tiny bony densities are seen along the dorsal aspect of the talus and navicular bones on lateral view, suggesting avulsion fractures. No dislocation is seen. Soft tissue swelling is present over the dorsum of the foot. Left ankle: There is no evidence of acute fracture or dislocation. Soft tissue swelling is noted along the anterior aspect of the ankle. IMPRESSION: Small avulsion fractures along the dorsal aspect of the talus and navicular bones. Electronically Signed   By: Leita Birmingham M.D.   On: 11/20/2023 18:19   DG Foot Complete Left Result Date: 11/20/2023 CLINICAL DATA:  Fall with left ankle pain and swelling. EXAM: PORTABLE LEFT ANKLE - 2 VIEW; LEFT FOOT - COMPLETE 3+ VIEW COMPARISON:  None Available. FINDINGS: Left foot: Tiny bony densities are seen along the dorsal aspect of the talus and navicular bones on lateral view, suggesting avulsion fractures. No dislocation is seen. Soft tissue swelling is present over the dorsum of the foot. Left ankle: There is no evidence of acute fracture or dislocation. Soft tissue swelling is noted along the anterior aspect of the ankle. IMPRESSION: Small avulsion fractures along the dorsal aspect of the talus and navicular bones. Electronically Signed   By: Leita Birmingham M.D.   On: 11/20/2023 18:19      Subjective:   Discharge Exam: Vitals:   12/16/23 0003 12/16/23 0728  BP: 110/74 122/86  Pulse: (!) 108 97  Resp:  16  Temp:  98.5 F (36.9 C)  SpO2: 100% 100%    General: Pt is alert, awake, not in acute distress Cardiovascular: rate controlled, S1/S2 + Respiratory: bilateral decreased breath sounds at bases Abdominal: Soft, NT, ND, bowel sounds + Extremities: no edema, no cyanosis    The results of significant diagnostics from this hospitalization (including imaging, microbiology, ancillary and laboratory) are listed below for reference.     Microbiology: No results found for this or  any previous visit (from the past 240 hours).   Labs: BNP (last 3 results) No results for input(s): BNP in the last 8760 hours. Basic Metabolic Panel: Recent Labs  Lab 12/14/23 1501 12/15/23 0028 12/16/23 0905  NA 138 137 137  K 3.7 4.5 3.5  CL 109 106 104  CO2 19* 22 20*  GLUCOSE 114* 96 98  BUN 7 6 8   CREATININE 0.52 0.64 0.72  CALCIUM 9.2 9.0 9.2   Liver Function Tests: Recent Labs  Lab 12/14/23 1501 12/15/23 0028 12/16/23 0905  AST 89* 87* 96*  ALT 223* 206* 223*  ALKPHOS 135* 130* 149*  BILITOT 0.7 0.9 0.6  PROT 7.0 7.3 7.3  ALBUMIN 3.4* 3.4* 3.4*   No results for input(s): LIPASE, AMYLASE in the last 168 hours. No results for input(s): AMMONIA in the last 168 hours. CBC: Recent Labs  Lab 12/14/23 1501 12/15/23 0028  WBC 4.5 4.1  NEUTROABS 2.2  --   HGB 12.2 12.3  HCT 37.8 37.6  MCV 90.4 90.8  PLT 211 210   Cardiac Enzymes: No results for input(s): CKTOTAL, CKMB, CKMBINDEX, TROPONINI in the last 168 hours. BNP: Invalid input(s): POCBNP CBG: No results for input(s): GLUCAP in the last 168 hours. D-Dimer No results for input(s): DDIMER in the last 72 hours. Hgb A1c No results for input(s): HGBA1C in the last 72 hours. Lipid Profile No results for input(s): CHOL, HDL, LDLCALC, TRIG, CHOLHDL, LDLDIRECT in the last 72 hours. Thyroid function studies No results for input(s): TSH, T4TOTAL, T3FREE, THYROIDAB in the last 72 hours.  Invalid input(s): FREET3 Anemia work up No results for input(s): VITAMINB12, FOLATE, FERRITIN, TIBC, IRON , RETICCTPCT in the last 72 hours. Urinalysis    Component Value Date/Time   COLORURINE YELLOW 12/14/2023 1526   APPEARANCEUR CLEAR 12/14/2023 1526   APPEARANCEUR Clear 04/06/2023 1511   LABSPEC 1.016 12/14/2023 1526   PHURINE 7.0 12/14/2023 1526   GLUCOSEU NEGATIVE 12/14/2023 1526   HGBUR NEGATIVE 12/14/2023 1526   BILIRUBINUR NEGATIVE 12/14/2023 1526    BILIRUBINUR negative 10/29/2023 1008   BILIRUBINUR Negative 04/06/2023 1511   KETONESUR NEGATIVE 12/14/2023 1526   PROTEINUR NEGATIVE 12/14/2023 1526   UROBILINOGEN 1.0 10/29/2023 1008   NITRITE NEGATIVE 12/14/2023 1526   LEUKOCYTESUR NEGATIVE 12/14/2023 1526   Sepsis Labs Recent Labs  Lab 12/14/23 1501 12/15/23 0028  WBC 4.5 4.1   Microbiology No results found for this or any previous visit (from the past 240 hours).   Time coordinating discharge: 35 minutes  SIGNED:   Cary Lothrop, MD  [facility] 12/16/2023, 3:44 PM

## 2023-12-20 ENCOUNTER — Encounter: Payer: Self-pay | Admitting: Adult Health

## 2023-12-20 ENCOUNTER — Ambulatory Visit: Payer: PRIVATE HEALTH INSURANCE | Admitting: Adult Health

## 2023-12-20 VITALS — BP 113/79 | HR 91 | Ht 68.0 in | Wt 288.0 lb

## 2023-12-20 DIAGNOSIS — Z3042 Encounter for surveillance of injectable contraceptive: Secondary | ICD-10-CM

## 2023-12-20 DIAGNOSIS — Z760 Encounter for issue of repeat prescription: Secondary | ICD-10-CM

## 2023-12-20 MED ORDER — FLUCONAZOLE 150 MG PO TABS: ORAL_TABLET | ORAL | 1 refills | Status: DC

## 2023-12-20 MED ORDER — MEDROXYPROGESTERONE ACETATE 150 MG/ML IM SUSY
150.0000 mg | PREFILLED_SYRINGE | Freq: Once | INTRAMUSCULAR | Status: AC
  Administered 2023-12-20: 150 mg via INTRAMUSCULAR

## 2023-12-20 NOTE — Progress Notes (Signed)
  Subjective:     Patient ID: Katie Price, female   DOB: 2000/11/18, 23 y.o.   MRN: [id number]  HPI Katie Price is a 23 year old black female,single, G1P0010, in for depo injection and ROS, and she is doing well.     Component Value Date/Time   DIAGPAP  09/23/2023 1057    - Negative for Intraepithelial Lesions or Malignancy (NILM)   DIAGPAP - Benign reactive/reparative changes 09/23/2023 1057   DIAGPAP  07/22/2020 1151    - Negative for intraepithelial lesion or malignancy (NILM)   HPVHIGH Negative 09/23/2023 1057   ADEQPAP  09/23/2023 1057    Satisfactory for evaluation; transformation zone component PRESENT.   ADEQPAP  07/22/2020 1151    Satisfactory but limited for evaluation with partially obscuring blood;   ADEQPAP transformation zone component present. 07/22/2020 1151   ADEQPAP  07/22/2020 1151    Satisfactory but limited for evaluation with scant cellularity;   ADEQPAP transformation zone component present. 07/22/2020 1151   PCP is T Leavy FNP Review of Systems Denies headaches or period with depo Requests refill on diflucan  Reviewed past medical,surgical, social and family history. Reviewed medications and allergies.     Objective:   Physical Exam BP 113/79 (BP Location: Left Arm, Patient Position: Sitting, Cuff Size: Large)   Pulse 91   Ht 5' 8 (1.727 m)   Wt 288 lb (130.6 kg)   BMI 43.79 kg/m     Skin warm and dry.  Lungs: clear to ausculation bilaterally. Cardiovascular: regular rate and rhythm.  She received depo IM.   Upstream - 12/20/23 1630       Pregnancy Intention Screening   Does the patient want to become pregnant in the next year? No    Does the patient's partner want to become pregnant in the next year? No    Would the patient like to discuss contraceptive options today? No      Contraception Wrap Up   Current Method Hormonal Injection    End Method Hormonal Injection    Contraception Counseling Provided Yes          Assessment:     1.  Encounter for surveillance of injectable contraceptive (Primary) She is happy with depo, no periods or headaches She got depo IM today Has refills on depo   Refilled diflucan  at her request Meds ordered this encounter  Medications   fluconazole  (DIFLUCAN ) 150 MG tablet    Sig: Take 1 now and 1 in 3 days if needed    Dispense:  2 tablet    Refill:  1    Supervising Provider:   JAYNE VONN DEL [[id number]]    Plan:     Return in 12 weeks for depo

## 2023-12-20 NOTE — Addendum Note (Signed)
 Addended by: NEYSA CLARITA RAMAN on: 12/20/2023 04:58 PM   Modules accepted: Orders

## 2023-12-26 NOTE — Addendum Note (Signed)
 Addendum  created 12/26/23 1923 by Leopoldo Bruckner, MD   Intraprocedure Staff edited

## 2023-12-27 ENCOUNTER — Ambulatory Visit: Payer: PRIVATE HEALTH INSURANCE | Admitting: Physician Assistant

## 2023-12-29 NOTE — Progress Notes (Signed)
 REFERRING PHYSICIAN:  Mcarthur Pick, MD PROVIDER:  DEWARD PURCHASE STECHSCHULTE, MD MRN: [id number] DOB: 07/14/2000 DATE OF ENCOUNTER: 12/29/2023 Subjective    Chief Complaint: New Consultation (Choledocholithiasis)   History of Present Illness: Katie Price is a 23 y.o. female who is seen today as an office consultation for evaluation of New Consultation (Choledocholithiasis)    History of Present Illness Katie Price is a 23 year old female who presents for follow-up after ERCP for gallbladder issues.  She has undergone an extensive workup for gallbladder issues, including an ultrasound, CT scan, HIDA scan, MRI, and ERCP. Prior to the ERCP, she experienced nausea and loose stools, but these symptoms have resolved since the procedure.  The ERCP involved a sphincterotomy to allow for better drainage, as her duct appeared slightly dilated on MRI, with suspicion of a small stone or sludge at the ampulla. No stones were observed during the procedure, and no stones were seen in the gallbladder on MRI.  She is currently able to eat and drink without restrictions and feels better overall. No stones were detected in previous imaging studies.     Review of Systems: A complete review of systems was obtained from the patient.  I have reviewed this information and discussed as appropriate with the patient.  See HPI as well for other ROS.  ROS   Medical History: Past Medical History:  Diagnosis Date  . Anxiety and depression   . Asthma, unspecified asthma severity, unspecified whether complicated, unspecified whether persistent (HHS-HCC)   . Bipolar 1 disorder (CMS/HHS-HCC)   . Bipolar 1 disorder, depressed, moderate (CMS/HHS-HCC)   . Bipolar disorder (CMS/HHS-HCC)   . Hypertension   . MDD (major depressive disorder)   . PE (pulmonary thromboembolism) (CMS/HHS-HCC)   . Tonsillar and adenoid hypertrophy     There is no problem list on file for this patient.   Past Surgical History:   Procedure Laterality Date  . Tonsillectomy and adenoidectomy (N/A)  08/02/2016  . Turbinate reduction (Bilateral)  03/31/2020  . Maxillary antrostomy (Bilateral)  03/31/2020  . Ethmoidectomy (Right)  03/31/2020  . Sinus endo with fusion (Bilateral)  03/31/2020  . Ercp (N/A)  12/15/2023  . SPHINCTEROTOMY ANAL  12/15/2023  . Wisdom tooth extraction       Allergies  Allergen Reactions  . Adhesive Unknown  . Adhesive Tape-Silicones Rash  . Latex Hives, Itching and Unknown  . Morphine Palpitations  . Phentermine Palpitations    Current Outpatient Medications on File Prior to Visit  Medication Sig Dispense Refill  . fexofenadine (ALLEGRA) 180 MG tablet Take 180 mg by mouth once daily    . lisdexamfetamine (VYVANSE) 30 MG capsule Take 30 mg by mouth once daily    . medroxyPROGESTERone  (DEPO-PROVERA ) 150 mg/mL injection Inject 150 mg into the muscle every 3 (three) months    . ondansetron  (ZOFRAN ) 4 MG tablet Take 4 mg by mouth every 8 (eight) hours as needed    . semaglutide  (WEGOVY ) 0.5 mg/0.5 mL pen injector Inject 1 mg subcutaneously once a week    . triamcinolone  0.1 % cream Apply 1 Application topically 2 (two) times daily    . albuterol  (PROVENTIL ) 2.5 mg /3 mL (0.083 %) nebulizer solution inhale the contents of 1 vial (3ml) by nebulization 3 (three) times daily. (Patient not taking: Reported on 12/29/2023)    . albuterol  MDI, PROVENTIL , VENTOLIN , PROAIR , HFA (VENTOLIN  HFA) 90 mcg/actuation inhaler Inhale 2 inhalations into the lungs every 6 (six) hours as needed (Patient not taking:  Reported on 12/29/2023)    . diphenhydrAMINE (BENADRYL) 25 mg capsule Take 25 mg by mouth at bedtime (Patient not taking: Reported on 12/29/2023)     No current facility-administered medications on file prior to visit.    Family History  Problem Relation Age of Onset  . Cervical cancer Mother   . High blood pressure (Hypertension) Mother   . Diabetes Mother      Social History   Tobacco Use   Smoking Status Former  . Types: Cigarettes  Smokeless Tobacco Never     Social History   Socioeconomic History  . Marital status: Single  Tobacco Use  . Smoking status: Former    Types: Cigarettes  . Smokeless tobacco: Never  Substance and Sexual Activity  . Alcohol use: Yes    Alcohol/week: 0.0 - 1.0 standard drinks of alcohol  . Drug use: Never  . Sexual activity: Defer   Social Drivers of Health   Financial Resource Strain: Low Risk  (02/17/2023)   Received from [facility]   Overall Financial Resource Strain (CARDIA)   . Difficulty of Paying Living Expenses: Not hard at all  Food Insecurity: No Food Insecurity (12/15/2023)   Received from [facility]   Hunger Vital Sign   . Within the past 12 months, you worried that your food would run out before you got the money to buy more.: Never true   . Within the past 12 months, the food you bought just didn't last and you didn't have money to get more.: Never true  Transportation Needs: No Transportation Needs (12/15/2023)   Received from [facility] - Transportation   . In the past 12 months, has lack of transportation kept you from medical appointments or from getting medications?: No   . In the past 12 months, has lack of transportation kept you from meetings, work, or from getting things needed for daily living?: No  Physical Activity: Sufficiently Active (02/17/2023)   Received from [facility]   Exercise Vital Sign   . On average, how many days per week do you engage in moderate to strenuous exercise (like a brisk walk)?: 3 days   . On average, how many minutes do you engage in exercise at this level?: 60 min  Stress: Stress Concern Present (02/17/2023)   Received from [facility] - Occupational Stress Questionnaire   . Feeling of Stress : Rather much  Social Connections: Moderately Integrated (02/17/2023)   Received from [facility]   Social Connection and Isolation  Panel   . In a typical week, how many times do you talk on the phone with family, friends, or neighbors?: Three times a week   . How often do you get together with friends or relatives?: Three times a week   . How often do you attend church or religious services?: Never   . Do you belong to any clubs or organizations such as church groups, unions, fraternal or athletic groups, or school groups?: Yes   . How often do you attend meetings of the clubs or organizations you belong to?: More than 4 times per year   . Are you married, widowed, divorced, separated, never married, or living with a partner?: Living with partner  Housing Stability: Unknown (12/29/2023)   Housing Stability Vital Sign   . Homeless in the Last Year: No    Objective:   Vitals:   12/29/23 1102  BP: 105/64  Pulse: 67  Temp: 36.8 C (98.2 F)  TempSrc: Temporal  SpO2: 97%  Weight: (!) 128.5 kg (283 lb 3.2 oz)  Height: 172.7 cm (5' 8)  PainSc: 0-No pain    Body mass index is 43.06 kg/m.  Physical Exam   General appearance - Consistent with stated age. Normal posture. Voice Normal. Mental status - alert and oriented Integumentary - No rash or lesion on limited exam Head - Normocephalic, atraumatic Face - Strength and tone intact Eyes - extraocular movement intact, sclera anicteric Chest - quiet, even and easy respiratory effort with no use of accessory muscles Neurological - able to articulate well with normal speech/language, rate, volume and coherence. Mood/affect - normal Judgement and insight -  insight is appropriate concerning matters relevant to self and the patient displays appropriate judgment regarding every day activities. Thought Processes/Cognitive Function - aware of current events. Musculoskeletal - strength symmetrical throughout, no deformity Abdomen - Soft, nontender  Physical Exam   Labs, Imaging and Diagnostic Testing: MRCP 12/10/23  Mild dilation of the extra and central  intrahepatic biliary ducts with the common duct measuring 7 mm, with a dilation extending to the level of the ampulla. Small amount of T2 hypointense signal in the distal duct may reflect sludge or tiny stones. Consider further evaluation with ERCP.  ERCP 12/15/23  ERCP with sphincterotomy with balloon sweep of common bile duct.   These images were submitted for radiologic interpretation only. Please see the procedural report for the amount of contrast and the fluoroscopy time utilized.   CT Abd/Pel 09/24/23  IMPRESSION: 1. No acute findings of cholecystitis. Evaluation limited by suboptimal contrast opacification and minimal exclusion of the hepatic dome. 2. Multiple small ileocolic mesenteric nodes, likely reactive . 3. Suspected right ovarian corpus luteal cyst, measuring 1.6 cm.  RUQ US  10/08/23  1. No cholelithiasis or sonographic evidence for acute cholecystitis. 2. Common bile duct is prominent measuring 6.7 mm. Recommend correlation with LFTs. If there is concern for biliary obstruction, recommend further evaluation with MRCP.   HIDA 10/14/23 Patency of both the cystic and common bile ducts demonstrated. Gallbladder ejection fraction within normal limits.        Assessment and Plan:     Diagnoses and all orders for this visit:  Sphincter of Oddi dysfunction     Assessment & Plan Biliary tract disease and possible sphincter of Oddi dysfunction, status post ERCP with sphincterotomy Status post ERCP with sphincterotomy for possible choledocholithiasis or sphincter of Oddi dysfunction. Symptoms of nausea and loose stools have resolved post-procedure. No stones were visualized during ERCP, but the duct appeared dilated with possible sludge. The sphincterotomy was performed to facilitate drainage, addressing potential sphincter of Oddi dysfunction. The condition is controversial, but ERCP has provided symptom relief. The sphincterotomy should allow for drainage. -  Continue to monitor symptoms and report any new or different issues. - Consider gallbladder removal if recurrent pain or symptoms develop, especially postprandial after fatty meals.       PAUL JEFFREY STECHSCHULTE, MD    This note has been created using automated tools and reviewed for accuracy by PAUL JEFFREY STECHSCHULTE.

## 2024-01-02 ENCOUNTER — Other Ambulatory Visit: Payer: Self-pay

## 2024-01-02 ENCOUNTER — Ambulatory Visit: Payer: PRIVATE HEALTH INSURANCE | Admitting: Physician Assistant

## 2024-01-02 DIAGNOSIS — S93401A Sprain of unspecified ligament of right ankle, initial encounter: Secondary | ICD-10-CM

## 2024-01-02 NOTE — Progress Notes (Unsigned)
 Office Visit Note   Patient: Katie Price           Date of Birth: 03-27-2000           MRN: [id number] Visit Date: 01/02/2024              Requested by: Leavy Waddell NOVAK, FNP [address] [place],  [place] [address] PCP: Leavy Waddell NOVAK, FNP  Chief Complaint  Patient presents with   Right Ankle - Follow-up      HPI: 23 y/o female had a traumatic fall and injured her left ankle.  She was seen at [facility] on 11/20/23. Initially she could not bear weight on her ankle.    Assessment & Plan: Visit Diagnoses: No diagnosis found.  Plan: ***  Follow-Up Instructions: No follow-ups on file.   Ortho Exam  Patient is alert, oriented, no adenopathy, well-dressed, normal affect, normal respiratory effort. ***    Imaging: No results found. No images are attached to the encounter.  Labs: Lab Results  Component Value Date   HGBA1C 5.5 09/15/2016   HGBA1C 5.4 04/11/2016   HGBA1C 5.6 05/10/2014   REPTSTATUS 08/27/2020 FINAL 08/24/2020   CULT  08/24/2020    NO GROWTH Performed at [facility], [address] [place], [place] [address]      Lab Results  Component Value Date   ALBUMIN 3.4 (L) 12/16/2023   ALBUMIN 3.4 (L) 12/15/2023   ALBUMIN 3.4 (L) 12/14/2023    No results found for: MG Lab Results  Component Value Date   VD25OH 16.7 (L) 04/13/2016   VD25OH 23 (L) 05/10/2014    No results found for: PREALBUMIN    Latest Ref Rng & Units 12/15/2023   12:28 AM 12/14/2023    3:01 PM 09/24/2023   12:59 PM  CBC EXTENDED  WBC 4.0 - 10.5 K/uL 4.1  4.5  4.4   RBC 3.87 - 5.11 MIL/uL 4.14  4.18  4.83   Hemoglobin 12.0 - 15.0 g/dL 87.6  87.7  85.8   HCT 36.0 - 46.0 % 37.6  37.8  42.7   Platelets 150 - 400 K/uL 210  211  211   NEUT# 1.7 - 7.7 K/uL  2.2    Lymph# 0.7 - 4.0 K/uL  1.7       There is no height or weight on file to calculate BMI.  Orders:  No orders of the defined types were placed in this encounter.  No orders of the defined types  were placed in this encounter.    Procedures: No procedures performed  Clinical Data: No additional findings.  ROS:  All other systems negative, except as noted in the HPI. Review of Systems  Objective: Vital Signs: There were no vitals taken for this visit.  Specialty Comments:  No specialty comments available.  PMFS History: Patient Active Problem List   Diagnosis Date Noted   Transaminitis 12/14/2023   Gallstone 12/14/2023   Dilation of biliary tract 12/14/2023   Reactive airway disease 12/14/2023   Choledocholithiasis 12/14/2023   Encounter for IUD removal 09/23/2023   Negative pregnancy test 09/23/2023   Pelvic cramping 09/23/2023   Routine Papanicolaou smear 09/23/2023   Encounter for initial prescription of injectable contraceptive 09/23/2023   Contraceptive management 09/23/2023   Trichomoniasis 04/07/2023   Hypertension 03/28/2023   GAD (generalized anxiety disorder) 02/17/2023   MDD (major depressive disorder), recurrent episode, moderate (HCC) 02/17/2023   Anxiety and depression 11/15/2022   Anxiety 09/06/2022  PTSD (post-traumatic stress disorder) 10/20/2021   Generalized anxiety disorder 10/20/2021   Circadian rhythm sleep disorder, delayed sleep phase type 10/20/2021   Elevated BP without diagnosis of hypertension 09/04/2021   Polypoid sinus degeneration 07/02/2020   Anaphylactic shock due to adverse food reaction 07/02/2020   Seasonal and perennial allergic rhinitis 07/02/2020   Mild persistent asthma, uncomplicated 07/02/2020   History of pulmonary embolism 06/04/2020   Irregular bleeding 11/01/2019   Menorrhagia with irregular cycle 06/15/2017   Obesity 04/12/2014   Eczema 04/12/2014   Seasonal allergies 04/12/2014   Acanthosis nigricans 04/12/2014   Past Medical History:  Diagnosis Date   Anxiety and depression 09/04/2021   Asthma    Bipolar 1 disorder (HCC) 04/10/2016   Bipolar 1 disorder, depressed, moderate (HCC) 05/14/2014   Bipolar  disorder (HCC)    Encounter for gynecological examination with Papanicolaou smear of cervix 07/22/2020   Encounter for initial prescription of contraceptive pills 06/04/2020   Encounter for initial prescription of contraceptives 06/04/2020   Encounter for initial prescription of vaginal ring hormonal contraceptive 11/01/2019   Failed vision screen 04/12/2014   General counseling and advice on contraceptive management 11/10/2020   Hypertension    MDD (major depressive disorder) 09/12/2016   PE (pulmonary thromboembolism) (HCC)    Pregnancy examination or test, negative result 06/15/2017   Screening examination for STD (sexually transmitted disease) 06/15/2017   Tonsillar and adenoid hypertrophy 07/2016   snores during sleep, denies apnea    Family History  Problem Relation Age of Onset   Hypertension Mother    Cancer Mother        cervical   Diabetes Mother    Other Maternal Grandmother        pre diabetic   Other Paternal Grandmother        brain tumor    Past Surgical History:  Procedure Laterality Date   ERCP N/A 12/15/2023   Procedure: ERCP, WITH INTERVENTION IF INDICATED;  Surgeon: Rollin Dover, MD;  Location[facility];  Service: Gastroenterology;  Laterality: N/A;   ETHMOIDECTOMY Right 03/31/2020   Procedure: RIGHT TOTAL ETHMOIDECTOMY AND FRONTAL RECESS exploration;  Surgeon: Karis Clunes, MD;  Location: [facility];  Service: ENT;  Laterality: Right;   MAXILLARY ANTROSTOMY Bilateral 03/31/2020   Procedure: ENDOSCOPIC MAXILLARY ANTROSTOMY WITH TISSUE REMOVAL;  Surgeon: Karis Clunes, MD;  Location: [facility];  Service: ENT;  Laterality: Bilateral;   SINUS ENDO WITH FUSION Bilateral 03/31/2020   Procedure: SINUS ENDOSCOPY WITH FUSION NAVIGATION;  Surgeon: Karis Clunes, MD;  Location: [facility];  Service: ENT;  Laterality: Bilateral;   SPHINCTEROTOMY  12/15/2023   Procedure: SPHINCTEROTOMY;  Surgeon: Rollin Dover, MD;  Location: [facility];   Service: Gastroenterology;;   TONSILLECTOMY AND ADENOIDECTOMY N/A 08/02/2016   Procedure: TONSILLECTOMY AND ADENOIDECTOMY;  Surgeon: Karis Clunes, MD;  Location: [facility];  Service: ENT;  Laterality: N/A;   TURBINATE REDUCTION Bilateral 03/31/2020   Procedure: TURBINATE REDUCTION;  Surgeon: Karis Clunes, MD;  Location: [facility];  Service: ENT;  Laterality: Bilateral;   WISDOM TOOTH EXTRACTION     Social History   Occupational History   Not on file  Tobacco Use   Smoking status: Former    Types: Cigarettes   Smokeless tobacco: Never  Vaping Use   Vaping status: Never Used  Substance and Sexual Activity   Alcohol use: Not Currently    Comment: once or twice a week socially   Drug use: Not Currently  Types: Marijuana    Comment: occassionally   Sexual activity: Yes    Birth control/protection: Condom, Injection

## 2024-01-05 ENCOUNTER — Encounter: Payer: Self-pay | Admitting: Physician Assistant

## 2024-02-07 ENCOUNTER — Ambulatory Visit

## 2024-02-07 ENCOUNTER — Other Ambulatory Visit (HOSPITAL_COMMUNITY)
Admission: RE | Admit: 2024-02-07 | Discharge: 2024-02-07 | Disposition: A | Discharge status: Home / Self Care | Source: Ambulatory Visit | Attending: Obstetrics & Gynecology | Admitting: Obstetrics & Gynecology

## 2024-02-07 DIAGNOSIS — R8290 Unspecified abnormal findings in urine: Secondary | ICD-10-CM | POA: Insufficient documentation

## 2024-02-07 DIAGNOSIS — N898 Other specified noninflammatory disorders of vagina: Secondary | ICD-10-CM

## 2024-02-07 DIAGNOSIS — N9489 Other specified conditions associated with female genital organs and menstrual cycle: Secondary | ICD-10-CM

## 2024-02-07 DIAGNOSIS — Z113 Encounter for screening for infections with a predominantly sexual mode of transmission: Secondary | ICD-10-CM | POA: Diagnosis not present

## 2024-02-07 LAB — POCT URINALYSIS DIPSTICK OB
Blood, UA: NEGATIVE
Glucose, UA: NEGATIVE
Ketones, UA: NEGATIVE
Leukocytes, UA: NEGATIVE
Nitrite, UA: NEGATIVE

## 2024-02-07 NOTE — Progress Notes (Signed)
" ° °  NURSE VISIT- VAGINITIS/STD/POC  SUBJECTIVE:  Katie Price is a 24 y.o. G1P0010 GYN patientfemale here for a vaginal swab for vaginitis screening, STD screen.  She reports the following symptoms: burning, discharge described as white, local irritation, odor, and vulvar itching. Denies abnormal vaginal bleeding, significant pelvic pain, fever, or UTI symptoms. Does want to send off urine just in case.  OBJECTIVE:  There were no vitals taken for this visit.  Appears well, in no apparent distress  ASSESSMENT: Vaginal swab for vaginitis screening  PLAN: Self-collected vaginal probe for Gonorrhea, Chlamydia, Trichomonas, Bacterial Vaginosis, Yeast sent to lab Urine sent to lab as well. Treatment: to be determined once results are received Follow-up as needed if symptoms persist/worsen, or new symptoms develop  Aleck FORBES Blase  02/07/2024 3:18 PM  "

## 2024-02-08 LAB — URINALYSIS, ROUTINE W REFLEX MICROSCOPIC
Glucose, UA: NEGATIVE
Nitrite, UA: NEGATIVE
RBC, UA: NEGATIVE
Urobilinogen, Ur: 1 mg/dL (ref 0.2–1.0)
pH, UA: 6 (ref 5.0–7.5)

## 2024-02-08 LAB — MICROSCOPIC EXAMINATION
Bacteria, UA: NONE SEEN
Casts: NONE SEEN /LPF

## 2024-02-09 ENCOUNTER — Ambulatory Visit: Payer: Self-pay | Admitting: Adult Health

## 2024-02-09 DIAGNOSIS — A599 Trichomoniasis, unspecified: Secondary | ICD-10-CM

## 2024-02-09 LAB — CERVICOVAGINAL ANCILLARY ONLY
Bacterial Vaginitis (gardnerella): POSITIVE — AB
Candida Glabrata: NEGATIVE
Candida Vaginitis: POSITIVE — AB
Chlamydia: NEGATIVE
Comment: NEGATIVE
Comment: NEGATIVE
Comment: NEGATIVE
Comment: NEGATIVE
Comment: NEGATIVE
Comment: NORMAL
Neisseria Gonorrhea: NEGATIVE
Trichomonas: POSITIVE — AB

## 2024-02-09 LAB — URINE CULTURE

## 2024-02-09 MED ORDER — METRONIDAZOLE 500 MG PO TABS: 500.0000 mg | ORAL_TABLET | Freq: Two times a day (BID) | ORAL | 0 refills | Status: AC

## 2024-02-09 MED ORDER — FLUCONAZOLE 150 MG PO TABS: ORAL_TABLET | ORAL | 1 refills | Status: AC

## 2024-02-11 ENCOUNTER — Encounter (HOSPITAL_BASED_OUTPATIENT_CLINIC_OR_DEPARTMENT_OTHER): Payer: Self-pay | Admitting: Emergency Medicine

## 2024-02-11 ENCOUNTER — Other Ambulatory Visit: Payer: Self-pay

## 2024-02-11 ENCOUNTER — Emergency Department (HOSPITAL_BASED_OUTPATIENT_CLINIC_OR_DEPARTMENT_OTHER)
Admission: EM | Admit: 2024-02-11 | Discharge: 2024-02-11 | Disposition: A | Discharge status: Home / Self Care | Attending: Emergency Medicine | Admitting: Emergency Medicine

## 2024-02-11 DIAGNOSIS — Z79899 Other long term (current) drug therapy: Secondary | ICD-10-CM | POA: Diagnosis not present

## 2024-02-11 DIAGNOSIS — R7989 Other specified abnormal findings of blood chemistry: Secondary | ICD-10-CM

## 2024-02-11 DIAGNOSIS — Z91040 Latex allergy status: Secondary | ICD-10-CM | POA: Diagnosis not present

## 2024-02-11 DIAGNOSIS — M549 Dorsalgia, unspecified: Secondary | ICD-10-CM | POA: Diagnosis present

## 2024-02-11 DIAGNOSIS — I10 Essential (primary) hypertension: Secondary | ICD-10-CM | POA: Diagnosis not present

## 2024-02-11 DIAGNOSIS — M5489 Other dorsalgia: Secondary | ICD-10-CM | POA: Diagnosis not present

## 2024-02-11 DIAGNOSIS — J45909 Unspecified asthma, uncomplicated: Secondary | ICD-10-CM | POA: Diagnosis not present

## 2024-02-11 DIAGNOSIS — R7401 Elevation of levels of liver transaminase levels: Secondary | ICD-10-CM | POA: Diagnosis not present

## 2024-02-11 LAB — HCG, SERUM, QUALITATIVE: Preg, Serum: NEGATIVE

## 2024-02-11 LAB — CBC WITH DIFFERENTIAL/PLATELET
Abs Immature Granulocytes: 0.01 K/uL (ref 0.00–0.07)
Basophils Absolute: 0 K/uL (ref 0.0–0.1)
Basophils Relative: 0 %
Eosinophils Absolute: 0.2 K/uL (ref 0.0–0.5)
Eosinophils Relative: 4 %
HCT: 39.1 % (ref 36.0–46.0)
Hemoglobin: 13 g/dL (ref 12.0–15.0)
Immature Granulocytes: 0 %
Lymphocytes Relative: 40 %
Lymphs Abs: 1.8 K/uL (ref 0.7–4.0)
MCH: 30 pg (ref 26.0–34.0)
MCHC: 33.2 g/dL (ref 30.0–36.0)
MCV: 90.1 fL (ref 80.0–100.0)
Monocytes Absolute: 0.2 K/uL (ref 0.1–1.0)
Monocytes Relative: 5 %
Neutro Abs: 2.2 K/uL (ref 1.7–7.7)
Neutrophils Relative %: 51 %
Platelets: 219 K/uL (ref 150–400)
RBC: 4.34 MIL/uL (ref 3.87–5.11)
RDW: 13.8 % (ref 11.5–15.5)
WBC: 4.4 K/uL (ref 4.0–10.5)
nRBC: 0 % (ref 0.0–0.2)

## 2024-02-11 LAB — COMPREHENSIVE METABOLIC PANEL WITH GFR
ALT: 266 U/L — ABNORMAL HIGH (ref 0–44)
AST: 102 U/L — ABNORMAL HIGH (ref 15–41)
Albumin: 4.4 g/dL (ref 3.5–5.0)
Alkaline Phosphatase: 259 U/L — ABNORMAL HIGH (ref 38–126)
Anion gap: 11 (ref 5–15)
BUN: 10 mg/dL (ref 6–20)
CO2: 25 mmol/L (ref 22–32)
Calcium: 9.8 mg/dL (ref 8.9–10.3)
Chloride: 103 mmol/L (ref 98–111)
Creatinine, Ser: 0.7 mg/dL (ref 0.44–1.00)
GFR, Estimated: >60 mL/min
Glucose, Bld: 99 mg/dL (ref 70–99)
Potassium: 3.5 mmol/L (ref 3.5–5.1)
Sodium: 139 mmol/L (ref 135–145)
Total Bilirubin: 0.5 mg/dL (ref 0.0–1.2)
Total Protein: 8 g/dL (ref 6.5–8.1)

## 2024-02-11 LAB — URINALYSIS, ROUTINE W REFLEX MICROSCOPIC
Bacteria, UA: NONE SEEN
Bilirubin Urine: NEGATIVE
Glucose, UA: NEGATIVE mg/dL
Hgb urine dipstick: NEGATIVE
Ketones, ur: 15 mg/dL — AB
Nitrite: NEGATIVE
Specific Gravity, Urine: 1.027 (ref 1.005–1.030)
pH: 6 (ref 5.0–8.0)

## 2024-02-11 LAB — LIPASE, BLOOD: Lipase: 44 U/L (ref 11–51)

## 2024-02-11 NOTE — ED Triage Notes (Signed)
 Patient c/o dark urine, and intermittent lower back pain.  Patient had an ERCP in December and reports these symptoms started after, patient reports she also had these symptoms before the ERCP, just not constant.  Patient had UA done at outside facility and was told she had trace amounts of bilirubin in her urine.

## 2024-02-11 NOTE — ED Provider Notes (Signed)
 " [place] [facility] AT [facility] Provider Note   CSN: [id number] Arrival date & time: 02/11/24  0046     Patient presents with: Flank Pain   Katie Price is a 24 y.o. female.   HPI     This is a 24 year old female who presents with back pain.  Patient reports intermittent back pain for the last 2 weeks and dark urine.  She was seen and evaluated urgent care.  She was diagnosed with trichomonas and has been treated appropriately.  She states that she had similar symptoms when she needed an ERCP in December.  She is concerned that her LFTs have not been rechecked since that time.  She is not having any abdominal pain.  Specifically not having any postprandial abdominal pain.  No fevers.  No dysuria.  Prior to Admission medications  Medication Sig Start Date End Date Taking? Authorizing Provider  albuterol  (PROVENTIL ) (2.5 MG/3ML) 0.083% nebulizer solution inhale the contents of 1 vial (3ml) by nebulization 3 (three) times daily. 01/27/22     diphenhydrAMINE (BENADRYL) 25 mg capsule Take 25 mg by mouth at bedtime.    [provider]  fexofenadine (ALLEGRA) 180 MG tablet Take 180 mg by mouth daily.    [provider]  fluconazole  (DIFLUCAN ) 150 MG tablet Take 1 now and 1 in 3 days if needed 02/09/24   Signa Delon LABOR, NP  lisdexamfetamine (VYVANSE) 30 MG capsule Take 30 mg by mouth daily. 12/08/23   [provider]  medroxyPROGESTERone  (DEPO-PROVERA ) 150 MG/ML injection Inject 1 mL (150 mg total) into the muscle every 3 (three) months. 09/23/23   Signa Delon LABOR, NP  metroNIDAZOLE  (FLAGYL ) 500 MG tablet Take 1 tablet (500 mg total) by mouth 2 (two) times daily. 02/09/24   Signa Delon LABOR, NP  Multiple Vitamin (MULTIVITAMIN) tablet Take 1 tablet by mouth daily.    [provider]  ondansetron  (ZOFRAN ) 4 MG tablet Take 1 tablet (4 mg total) by mouth every 8 (eight) hours as needed. 12/27/22   Signa Delon LABOR, NP   triamcinolone  cream (KENALOG ) 0.1 % Apply 1 Application topically 2 (two) times daily. 12/07/23   Moishe Chiquita HERO, NP  VENTOLIN  HFA 108 (90 Base) MCG/ACT inhaler Inhale 2 puffs into the lungs every 6 (six) hours as needed. 02/26/21   [provider]  WEGOVY  0.5 MG/0.5ML SOAJ SQ injection Inject 1 mg into the skin every Tuesday. 08/31/23   [provider]    Allergies: Adhesive [tape], Latex, Morphine, and Phentermine    Review of Systems  Constitutional:  Negative for fever.  Respiratory:  Negative for shortness of breath.   Cardiovascular:  Negative for chest pain.  Musculoskeletal:  Positive for back pain.  All other systems reviewed and are negative.   Updated Vital Signs BP 120/83   Pulse 88   Temp 98.1 F (36.7 C)   Resp 20   Wt 131 kg   SpO2 99%   BMI 43.91 kg/m   Physical Exam Vitals and nursing note reviewed.  Constitutional:      Appearance: She is well-developed. She is obese. She is not ill-appearing.  HENT:     Head: Normocephalic and atraumatic.  Eyes:     Pupils: Pupils are equal, round, and reactive to light.  Cardiovascular:     Rate and Rhythm: Normal rate and regular rhythm.     Heart sounds: Normal heart sounds.  Pulmonary:     Effort: Pulmonary effort is normal. No respiratory  distress.     Breath sounds: No wheezing.  Abdominal:     Palpations: Abdomen is soft.     Tenderness: There is no abdominal tenderness.  Musculoskeletal:     Cervical back: Neck supple.  Skin:    General: Skin is warm and dry.  Neurological:     Mental Status: She is alert and oriented to person, place, and time.  Psychiatric:        Mood and Affect: Mood normal.     (all labs ordered are listed, but only abnormal results are displayed) Labs Reviewed  COMPREHENSIVE METABOLIC PANEL WITH GFR - Abnormal; Notable for the following components:      Result Value   AST 102 (*)    ALT 266 (*)    Alkaline Phosphatase 259 (*)    All other components within  normal limits  URINALYSIS, ROUTINE W REFLEX MICROSCOPIC - Abnormal; Notable for the following components:   Ketones, ur 15 (*)    Protein, ur TRACE (*)    Leukocytes,Ua SMALL (*)    All other components within normal limits  CBC WITH DIFFERENTIAL/PLATELET  LIPASE, BLOOD  HCG, SERUM, QUALITATIVE    EKG: None  Radiology: No results found.   Procedures   Medications Ordered in the ED - No data to display                                  Medical Decision Making Amount and/or Complexity of Data Reviewed Labs: ordered.   This patient presents to the ED for concern of back pain, elevated LFTs, this involves an extensive number of treatment options, and is a complaint that carries with it a high risk of complications and morbidity.  I considered the following differential and admission for this acute, potentially life threatening condition.  The differential diagnosis includes pyelonephritis, back strain, kidney stone, ongoing gallbladder issues  MDM:    This is a 24 year old female who presents with back pain.  Reports waxing waning of the last 2 weeks.  However, she states she is more concerned about her liver function test not being repeated.  She is nontoxic and vital signs are reassuring.  Chart reviewed.  She had an ERCP and had a sphincterotomy.  She has since followed up with general surgery who felt like her gallbladder was functioning appropriately and unless she had ongoing pain or postprandial pain, or not recommending a cholecystectomy.  She denied having any abdominal pain and her abdominal exam is benign.  Urinalysis reviewed from urgent care and did not show any growth.  She is being treated for trichomonas appropriately.  Today her LFTs are at their recent baseline with AST and ALT and alk phos elevations in the 1-200s.  No leukocytosis.  Discussed this with the patient.  She would like to follow-up with GI.  She does not wish to pursue further workup here in the emergency  department as scans did not show anything last time.  She was given return precautions.  (Labs, imaging, consults)  Labs: I Ordered, and personally interpreted labs.  The pertinent results include: CBC, CMP, lipase  Imaging Studies ordered: I ordered imaging studies including none I independently visualized and interpreted imaging. I agree with the radiologist interpretation  Additional history obtained from chart review.  External records from outside source obtained and reviewed including prior admission and surgical notes  Cardiac Monitoring: The patient was not maintained on a cardiac  monitor.  If on the cardiac monitor, I personally viewed and interpreted the cardiac monitored which showed an underlying rhythm of: N/A  Reevaluation: After the interventions noted above, I reevaluated the patient and found that they have :stayed the same  Social Determinants of Health:  lives independently  Disposition: Discharge  Co morbidities that complicate the patient evaluation  Past Medical History:  Diagnosis Date   Anxiety and depression 09/04/2021   Asthma    Bipolar 1 disorder (HCC) 04/10/2016   Bipolar 1 disorder, depressed, moderate (HCC) 05/14/2014   Bipolar disorder (HCC)    Encounter for gynecological examination with Papanicolaou smear of cervix 07/22/2020   Encounter for initial prescription of contraceptive pills 06/04/2020   Encounter for initial prescription of contraceptives 06/04/2020   Encounter for initial prescription of vaginal ring hormonal contraceptive 11/01/2019   Failed vision screen 04/12/2014   General counseling and advice on contraceptive management 11/10/2020   Hypertension    MDD (major depressive disorder) 09/12/2016   PE (pulmonary thromboembolism) (HCC)    Pregnancy examination or test, negative result 06/15/2017   Screening examination for STD (sexually transmitted disease) 06/15/2017   Tonsillar and adenoid hypertrophy 07/2016   snores  during sleep, denies apnea     Medicines No orders of the defined types were placed in this encounter.   I have reviewed the patients home medicines and have made adjustments as needed  Problem List / ED Course: Problem List Items Addressed This Visit   None Visit Diagnoses       Elevated LFTs    -  Primary     Other acute back pain                    Final diagnoses:  Elevated LFTs  Other acute back pain    ED Discharge Orders     None          Bari Charmaine FALCON, MD 02/11/24 0235  "

## 2024-02-11 NOTE — Discharge Instructions (Signed)
 You are seen today for ongoing back pain and elevated LFTs.  Your LFTs are stable.  Follow-up with your GI doctor.  If you have any new or worsening symptoms including fever, pain after eating, you should be reevaluated.

## 2024-03-13 ENCOUNTER — Ambulatory Visit: Payer: PRIVATE HEALTH INSURANCE
